# Patient Record
Sex: Male | Born: 1955 | Race: White | Hispanic: No | Marital: Married | Smoking: Former smoker
Health system: Southern US, Community
[De-identification: ages and names within clinical notes are randomized; demographics above are authoritative.]

## PROBLEM LIST (undated history)

## (undated) DIAGNOSIS — R7303 Prediabetes: Secondary | ICD-10-CM

## (undated) DIAGNOSIS — M1990 Unspecified osteoarthritis, unspecified site: Secondary | ICD-10-CM

## (undated) DIAGNOSIS — I4891 Unspecified atrial fibrillation: Secondary | ICD-10-CM

## (undated) DIAGNOSIS — K219 Gastro-esophageal reflux disease without esophagitis: Secondary | ICD-10-CM

## (undated) DIAGNOSIS — I10 Essential (primary) hypertension: Secondary | ICD-10-CM

## (undated) DIAGNOSIS — K2270 Barrett's esophagus without dysplasia: Secondary | ICD-10-CM

## (undated) DIAGNOSIS — I499 Cardiac arrhythmia, unspecified: Secondary | ICD-10-CM

## (undated) HISTORY — DX: Unspecified osteoarthritis, unspecified site: M19.90

## (undated) HISTORY — PX: KNEE SURGERY: SHX244

## (undated) HISTORY — PX: CARDIOVERSION: SHX1299

## (undated) HISTORY — DX: Gastro-esophageal reflux disease without esophagitis: K21.9

---

## 1999-05-05 ENCOUNTER — Emergency Department (HOSPITAL_COMMUNITY): Admission: EM | Admit: 1999-05-05 | Discharge: 1999-05-05 | Payer: Self-pay | Admitting: Dentistry

## 2005-12-22 ENCOUNTER — Ambulatory Visit: Payer: Self-pay | Admitting: Gastroenterology

## 2006-01-27 ENCOUNTER — Ambulatory Visit: Payer: Self-pay | Admitting: Gastroenterology

## 2006-03-09 ENCOUNTER — Ambulatory Visit: Payer: Self-pay | Admitting: Gastroenterology

## 2011-10-20 HISTORY — PX: CARDIOVERSION: SHX1299

## 2012-01-30 ENCOUNTER — Emergency Department (HOSPITAL_COMMUNITY): Payer: Self-pay

## 2012-01-30 ENCOUNTER — Emergency Department (HOSPITAL_COMMUNITY)
Admission: EM | Admit: 2012-01-30 | Discharge: 2012-01-30 | Disposition: A | Discharge status: Home / Self Care | Payer: Self-pay | Attending: Emergency Medicine | Admitting: Emergency Medicine

## 2012-01-30 ENCOUNTER — Encounter (HOSPITAL_COMMUNITY): Payer: Self-pay

## 2012-01-30 DIAGNOSIS — F17200 Nicotine dependence, unspecified, uncomplicated: Secondary | ICD-10-CM | POA: Insufficient documentation

## 2012-01-30 DIAGNOSIS — S81009A Unspecified open wound, unspecified knee, initial encounter: Secondary | ICD-10-CM | POA: Insufficient documentation

## 2012-01-30 DIAGNOSIS — Y92009 Unspecified place in unspecified non-institutional (private) residence as the place of occurrence of the external cause: Secondary | ICD-10-CM | POA: Insufficient documentation

## 2012-01-30 DIAGNOSIS — S81019A Laceration without foreign body, unspecified knee, initial encounter: Secondary | ICD-10-CM

## 2012-01-30 DIAGNOSIS — M25579 Pain in unspecified ankle and joints of unspecified foot: Secondary | ICD-10-CM | POA: Insufficient documentation

## 2012-01-30 DIAGNOSIS — S93409A Sprain of unspecified ligament of unspecified ankle, initial encounter: Secondary | ICD-10-CM | POA: Insufficient documentation

## 2012-01-30 DIAGNOSIS — S81809A Unspecified open wound, unspecified lower leg, initial encounter: Secondary | ICD-10-CM | POA: Insufficient documentation

## 2012-01-30 DIAGNOSIS — S92919A Unspecified fracture of unspecified toe(s), initial encounter for closed fracture: Secondary | ICD-10-CM | POA: Insufficient documentation

## 2012-01-30 DIAGNOSIS — W19XXXA Unspecified fall, initial encounter: Secondary | ICD-10-CM

## 2012-01-30 DIAGNOSIS — M25569 Pain in unspecified knee: Secondary | ICD-10-CM | POA: Insufficient documentation

## 2012-01-30 DIAGNOSIS — W010XXA Fall on same level from slipping, tripping and stumbling without subsequent striking against object, initial encounter: Secondary | ICD-10-CM | POA: Insufficient documentation

## 2012-01-30 MED ORDER — OXYCODONE-ACETAMINOPHEN 5-325 MG PO TABS: 1.0000 | ORAL_TABLET | Freq: Four times a day (QID) | ORAL | Status: AC | PRN

## 2012-01-30 MED ORDER — IBUPROFEN 200 MG PO TABS
600.0000 mg | ORAL_TABLET | Freq: Once | ORAL | Status: AC
  Administered 2012-01-30: 600 mg via ORAL
  Filled 2012-01-30: qty 3

## 2012-01-30 MED ORDER — LIDOCAINE-EPINEPHRINE 2 %-1:100000 IJ SOLN: 10.0000 mL | Freq: Once | INTRAMUSCULAR | Status: DC

## 2012-01-30 MED ORDER — ONDANSETRON 8 MG PO TBDP
8.0000 mg | ORAL_TABLET | Freq: Once | ORAL | Status: AC
  Administered 2012-01-30: 8 mg via ORAL
  Filled 2012-01-30: qty 1

## 2012-01-30 MED ORDER — OXYCODONE-ACETAMINOPHEN 5-325 MG PO TABS
1.0000 | ORAL_TABLET | Freq: Once | ORAL | Status: AC
  Administered 2012-01-30: 1 via ORAL
  Filled 2012-01-30: qty 1

## 2012-01-30 MED ORDER — LIDOCAINE HCL 2 % IJ SOLN
INTRAMUSCULAR | Status: AC
  Administered 2012-01-30: 20 mg
  Filled 2012-01-30: qty 1

## 2012-01-30 NOTE — ED Notes (Signed)
Pt in with c/o right great toe/knee pain and left ankle pain states fell this am pt present with a laceration/abrasion to the left knee dressing in place bruising to the left great toe pain with movement and right ankle swelling

## 2012-01-30 NOTE — ED Provider Notes (Signed)
History     CSN: [id number]  Arrival date & time 01/30/12  1324   First MD Initiated Contact with Patient 01/30/12 1503      Chief Complaint  Patient presents with  . Toe Injury    right  . Ankle Pain    left  . Fall  . Knee Pain    laceration to the right knee    (Consider location/radiation/quality/duration/timing/severity/associated sxs/prior treatment) Patient is a 56 y.o. male presenting with ankle pain, fall, and knee pain. The history is provided by the patient.  Ankle Pain   Fall Pertinent negatives include no fever, no abdominal pain, no nausea, no vomiting and no headaches.  Knee Pain Pertinent negatives include no chest pain, no abdominal pain, no headaches and no shortness of breath.  pt staes was on front steps of home, stumbled, fell forward. C/o left ankle pain laterally and right great toe pain. Constant, dull pain. Non radiating. Worse w palpation and movement. Skin intact in both areas. 1.5 cm lac to right knee anteriorly. Pain localized to lac. Is able to walk. States cut occurred on stone block, denies glass/gravel/other fb. Tetanus within last 2 years. Denies head injury or headache. No neck or back pain. Denies other injury. No nv.   History reviewed. No pertinent past medical history.  History reviewed. No pertinent past surgical history.  No family history on file.  History  Substance Use Topics  . Smoking status: Current Some Day Smoker  . Smokeless tobacco: Not on file  . Alcohol Use: Yes      Review of Systems  Constitutional: Negative for fever.  HENT: Negative for neck pain.   Respiratory: Negative for shortness of breath.   Cardiovascular: Negative for chest pain.  Gastrointestinal: Negative for nausea, vomiting and abdominal pain.  Genitourinary: Negative for flank pain.  Musculoskeletal: Negative for back pain.  Skin: Positive for wound.  Neurological: Negative for headaches.  Hematological: Does not bruise/bleed easily.    Psychiatric/Behavioral: Negative for confusion.    Allergies  Vicodin  Home Medications   Current Outpatient Rx  Name Route Sig Dispense Refill  . ACETAMINOPHEN 500 MG PO TABS Oral Take 1,000 mg by mouth every 6 (six) hours as needed. pain    . LANSOPRAZOLE 30 MG PO CPDR Oral Take 30 mg by mouth daily.      BP 146/91  Pulse 117  Temp(Src) 98.1 F (36.7 C) (Oral)  Resp 18  SpO2 99%  Physical Exam  Nursing note and vitals reviewed. Constitutional: He is oriented to person, place, and time. He appears well-developed and well-nourished. No distress.  HENT:  Head: Atraumatic.  Eyes: Pupils are equal, round, and reactive to light.  Neck: Neck supple. No tracheal deviation present.  Cardiovascular: Normal rate.   Pulmonary/Chest: Effort normal. No accessory muscle usage. No respiratory distress.  Abdominal: Soft. He exhibits no distension. There is no tenderness.  Musculoskeletal: Normal range of motion.       Good rom bil knees and ankles. bil distal pulses palp. 1.5 cm lac to right knee anteriorly. Knee stable, no gross ligament laxity. No effusion. sts and tenderness base right great toe, skin intact. Normal cap refill distally. sts and tenderness left lateral malleolus. Ankle stable. Skin intact. No other focal bony tenderness noted on extremity exam.   Neurological: He is alert and oriented to person, place, and time.       Motor intact bil   Skin: Skin is warm and dry.  Psychiatric: He has  a normal mood and affect.    ED Course  Procedures (including critical care time)  No results found for this or any previous visit. Dg Ankle Complete Left  01/30/2012  *RADIOLOGY REPORT*  Clinical Data: Fall, injury   LEFT ANKLE COMPLETE - 3+ VIEW  Comparison: None.  Findings: Ankle mortise intact.  Talar dome is normal.  No malleolar fracture.  Calcaneus is normal.  IMPRESSION: No evidence of left ankle fracture.  Original Report Authenticated By: Genevive Bi, M.D.   Dg Knee  Complete 4 Views Right  01/30/2012  *RADIOLOGY REPORT*  Clinical Data: Confusion and laceration.  Rule out fracture or foreign body.  RIGHT KNEE - COMPLETE 4+ VIEW  Comparison: None.  Findings: The right knee is located.  There is no significant effusion.  No acute bone or soft tissue abnormality is present.  IMPRESSION: Negative right knee.  Original Report Authenticated By: Jamesetta Orleans. MATTERN, M.D.   Dg Toe Great Right  01/30/2012  *RADIOLOGY REPORT*  Clinical Data: Toe injury  RIGHT GREAT TOE  Comparison: None.  Findings: There is a fracture along the medial aspect of the base of the proximal phalanx of the first digit.  Fracture likely enters articular surface.  IMPRESSION: Fracture at the base of the proximal phalanx of the first digit.  Original Report Authenticated By: Genevive Bi, M.D.        MDM  aso to left ankle. Crutches. Dynamic splint to right great toe. Skin intact in both areas.  1.5 cm lac to right knee. No fb seen/felt. Sutured. Motrin po. Tetanus up to date.    LACERATION REPAIR Performed by: Suzi Roots Authorized by: Suzi Roots Consent: Verbal consent obtained. Risks and benefits: risks, benefits and alternatives were discussed Consent given by: patient Patient identity confirmed: provided demographic data Prepped and Draped in normal sterile fashion Wound explored  Laceration Location: right knee  Laceration Length: 1.5cm  No Foreign Bodies seen or palpated  Anesthesia: local infiltration  Local anesthetic: lidocaine 2% wo epinephrine  Anesthetic total: 3 ml  Irrigation method: syringe Amount of cleaning: standard  Skin closure: 3-0 prolene  Number of sutures: 2  Technique: interrupted.   Patient tolerance: Patient tolerated the procedure well with no immediate complications. No fb seen or felt.      Rxn to vicodin is nausea. Percocet 1 po (has ride, does not have to drive), zofran po.        Suzi Roots,  MD 01/30/12 [phone]

## 2012-01-30 NOTE — ED Notes (Signed)
Patient transported to X-ray 

## 2012-01-30 NOTE — ED Notes (Signed)
Veatrice Bourbon Tech called for ASO Ankle, crutches and Buddy Tape.

## 2012-01-30 NOTE — Discharge Instructions (Signed)
Ice/cold pack to sore areas. May use cast shoe and buddy tape first toe to next for comfort/support.  Follow up with orthopedist in coming week. Elevate/ice injured feet/ankle. For ankle, wear brace/support as need for comfort, use crutches as need.Take motrin or aleve as need for pain. You may also take percocet as need for pain. No driving for the next 6 hours or when taking percocet. Also, do not take tylenol or acetaminophen containing medication when taking percocet.  Keep wound very clean. Have sutures removed, your doctor or urgent care, in 12-14 days.  Return to ER if worse, new symptoms, severe pain, infection of wound, other concern.       Ankle Sprain An ankle sprain is an injury to the strong, fibrous tissues (ligaments) that hold the bones of your ankle joint together.  CAUSES Ankle sprain usually is caused by a fall or by twisting your ankle. People who participate in sports are more prone to these types of injuries.  SYMPTOMS  Symptoms of ankle sprain include:  Pain in your ankle. The pain may be present at rest or only when you are trying to stand or walk.   Swelling.   Bruising. Bruising may develop immediately or within 1 to 2 days after your injury.   Difficulty standing or walking.  DIAGNOSIS  Your caregiver will ask you details about your injury and perform a physical exam of your ankle to determine if you have an ankle sprain. During the physical exam, your caregiver will press and squeeze specific areas of your foot and ankle. Your caregiver will try to move your ankle in certain ways. An X-ray exam may be done to be sure a bone was not broken or a ligament did not separate from one of the bones in your ankle (avulsion).  TREATMENT  Certain types of braces can help stabilize your ankle. Your caregiver can make a recommendation for this. Your caregiver may recommend the use of medication for pain. If your sprain is severe, your caregiver may refer you to a surgeon who  helps to restore function to parts of your skeletal system (orthopedist) or a physical therapist. HOME CARE INSTRUCTIONS  Apply ice to your injury for 1 to 2 days or as directed by your caregiver. Applying ice helps to reduce inflammation and pain.  Put ice in a plastic bag.   Place a towel between your skin and the bag.   Leave the ice on for 15 to 20 minutes at a time, every 2 hours while you are awake.   Take over-the-counter or prescription medicines for pain, discomfort, or fever only as directed by your caregiver.   Keep your injured leg elevated, when possible, to lessen swelling.   If your caregiver recommends crutches, use them as instructed. Gradually, put weight on the affected ankle. Continue to use crutches or a cane until you can walk without feeling pain in your ankle.   If you have a plaster splint, wear the splint as directed by your caregiver. Do not rest it on anything harder than a pillow the first 24 hours. Do not put weight on it. Do not get it wet. You may take it off to take a shower or bath.   You may have been given an elastic bandage to wear around your ankle to provide support. If the elastic bandage is too tight (you have numbness or tingling in your foot or your foot becomes cold and blue), adjust the bandage to make it comfortable.  If you have an air splint, you may blow more air into it or let air out to make it more comfortable. You may take your splint off at night and before taking a shower or bath.   Wiggle your toes in the splint several times per day if you are able.  SEEK MEDICAL CARE IF:   You have an increase in bruising, swelling, or pain.   Your toes feel cold.   Pain relief is not achieved with medication.  SEEK IMMEDIATE MEDICAL CARE IF: Your toes are numb or blue or you have severe pain. MAKE SURE YOU:   Understand these instructions.   Will watch your condition.   Will get help right away if you are not doing well or get worse.    Document Released: 10/05/2005 Document Revised: 09/24/2011 Document Reviewed: 05/09/2008 [facility] Patient Information 2012 [place], [place].      Toe Fracture Your caregiver has diagnosed you as having a fractured toe. A toe fracture is a break in the bone of a toe. "Buddy taping" is a way of splinting your broken toe, by taping the broken toe to the toe next to it. This "buddy taping" will keep the injured toe from moving beyond normal range of motion. Buddy taping also helps the toe heal in a more normal alignment. It may take 6 to 8 weeks for the toe injury to heal. HOME CARE INSTRUCTIONS   Leave your toes taped together for as long as directed by your caregiver or until you see a doctor for a follow-up examination. You can change the tape after bathing. Always use a small piece of gauze or cotton between the toes when taping them together. This will help the skin stay dry and prevent infection.   Apply ice to the injury for 15 to 20 minutes each hour while awake for the first 2 days. Put the ice in a plastic bag and place a towel between the bag of ice and your skin.   After the first 2 days, apply heat to the injured area. Use heat for the next 2 to 3 days. Place a heating pad on the foot or soak the foot in warm water as directed by your caregiver.   Keep your foot elevated as much as possible to lessen swelling.   Wear sturdy, supportive shoes. The shoes should not pinch the toes or fit tightly against the toes.   Your caregiver may prescribe a rigid shoe if your foot is very swollen.   Your may be given crutches if the pain is too great and it hurts too much to walk.   Only take over-the-counter or prescription medicines for pain, discomfort, or fever as directed by your caregiver.   If your caregiver has given you a follow-up appointment, it is very important to keep that appointment. Not keeping the appointment could result in a chronic or permanent injury, pain, and disability.  If there is any problem keeping the appointment, you must call back to this facility for assistance.  SEEK MEDICAL CARE IF:   You have increased pain or swelling, not relieved with medications.   The pain does not get better after 1 week.   Your injured toe is cold when the others are warm.  SEEK IMMEDIATE MEDICAL CARE IF:   The toe becomes cold, numb, or white.   The toe becomes hot (inflamed) and red.  Document Released: 10/02/2000 Document Revised: 09/24/2011 Document Reviewed: 05/21/2008 [facility] Patient Information 2012 [place], [place].  Laceration Care, Adult A laceration is a cut or lesion that goes through all layers of the skin and into the tissue just beneath the skin. TREATMENT  Some lacerations may not require closure. Some lacerations may not be able to be closed due to an increased risk of infection. It is important to see your caregiver as soon as possible after an injury to minimize the risk of infection and maximize the opportunity for successful closure. If closure is appropriate, pain medicines may be given, if needed. The wound will be cleaned to help prevent infection. Your caregiver will use stitches (sutures), staples, wound glue (adhesive), or skin adhesive strips to repair the laceration. These tools bring the skin edges together to allow for faster healing and a better cosmetic outcome. However, all wounds will heal with a scar. Once the wound has healed, scarring can be minimized by covering the wound with sunscreen during the day for 1 full year. HOME CARE INSTRUCTIONS  For sutures or staples:  Keep the wound clean and dry.   If you were given a bandage (dressing), you should change it at least once a day. Also, change the dressing if it becomes wet or dirty, or as directed by your caregiver.   Wash the wound with soap and water 2 times a day. Rinse the wound off with water to remove all soap. Pat the wound dry with a clean towel.   After cleaning,  apply a thin layer of the antibiotic ointment as recommended by your caregiver. This will help prevent infection and keep the dressing from sticking.   You may shower as usual after the first 24 hours. Do not soak the wound in water until the sutures are removed.   Only take over-the-counter or prescription medicines for pain, discomfort, or fever as directed by your caregiver.   Get your sutures or staples removed as directed by your caregiver.  For skin adhesive strips:  Keep the wound clean and dry.   Do not get the skin adhesive strips wet. You may bathe carefully, using caution to keep the wound dry.   If the wound gets wet, pat it dry with a clean towel.   Skin adhesive strips will fall off on their own. You may trim the strips as the wound heals. Do not remove skin adhesive strips that are still stuck to the wound. They will fall off in time.  For wound adhesive:  You may briefly wet your wound in the shower or bath. Do not soak or scrub the wound. Do not swim. Avoid periods of heavy perspiration until the skin adhesive has fallen off on its own. After showering or bathing, gently pat the wound dry with a clean towel.   Do not apply liquid medicine, cream medicine, or ointment medicine to your wound while the skin adhesive is in place. This may loosen the film before your wound is healed.   If a dressing is placed over the wound, be careful not to apply tape directly over the skin adhesive. This may cause the adhesive to be pulled off before the wound is healed.   Avoid prolonged exposure to sunlight or tanning lamps while the skin adhesive is in place. Exposure to ultraviolet light in the first year will darken the scar.   The skin adhesive will usually remain in place for 5 to 10 days, then naturally fall off the skin. Do not pick at the adhesive film.  You may need a tetanus shot if:  You  cannot remember when you had your last tetanus shot.   You have never had a tetanus  shot.  If you get a tetanus shot, your arm may swell, get red, and feel warm to the touch. This is common and not a problem. If you need a tetanus shot and you choose not to have one, there is a rare chance of getting tetanus. Sickness from tetanus can be serious. SEEK MEDICAL CARE IF:   You have redness, swelling, or increasing pain in the wound.   You see a red line that goes away from the wound.   You have yellowish-white fluid (pus) coming from the wound.   You have a fever.   You notice a bad smell coming from the wound or dressing.   Your wound breaks open before or after sutures have been removed.   You notice something coming out of the wound such as wood or glass.   Your wound is on your hand or foot and you cannot move a finger or toe.  SEEK IMMEDIATE MEDICAL CARE IF:   Your pain is not controlled with prescribed medicine.   You have severe swelling around the wound causing pain and numbness or a change in color in your arm, hand, leg, or foot.   Your wound splits open and starts bleeding.   You have worsening numbness, weakness, or loss of function of any joint around or beyond the wound.   You develop painful lumps near the wound or on the skin anywhere on your body.  MAKE SURE YOU:   Understand these instructions.   Will watch your condition.   Will get help right away if you are not doing well or get worse.  Document Released: 10/05/2005 Document Revised: 09/24/2011 Document Reviewed: 03/31/2011 [facility] Patient Information 2012 [place], [place].     Cryotherapy Cryotherapy means treatment with cold. Ice or gel packs can be used to reduce both pain and swelling. Ice is the most helpful within the first 24 to 48 hours after an injury or flareup from overusing a muscle or joint. Sprains, strains, spasms, burning pain, shooting pain, and aches can all be eased with ice. Ice can also be used when recovering from surgery. Ice is effective, has very few side  effects, and is safe for most people to use. PRECAUTIONS  Ice is not a safe treatment option for people with:  Raynaud's phenomenon. This is a condition affecting small blood vessels in the extremities. Exposure to cold may cause your problems to return.   Cold hypersensitivity. There are many forms of cold hypersensitivity, including:   Cold urticaria. Red, itchy hives appear on the skin when the tissues begin to warm after being iced.   Cold erythema. This is a red, itchy rash caused by exposure to cold.   Cold hemoglobinuria. Red blood cells break down when the tissues begin to warm after being iced. The hemoglobin that carry oxygen are passed into the urine because they cannot combine with blood proteins fast enough.   Numbness or altered sensitivity in the area being iced.  If you have any of the following conditions, do not use ice until you have discussed cryotherapy with your caregiver:  Heart conditions, such as arrhythmia, angina, or chronic heart disease.   High blood pressure.   Healing wounds or open skin in the area being iced.   Current infections.   Rheumatoid arthritis.   Poor circulation.   Diabetes.  Ice slows the blood flow in the region  it is applied. This is beneficial when trying to stop inflamed tissues from spreading irritating chemicals to surrounding tissues. However, if you expose your skin to cold temperatures for too long or without the proper protection, you can damage your skin or nerves. Watch for signs of skin damage due to cold. HOME CARE INSTRUCTIONS Follow these tips to use ice and cold packs safely.  Place a dry or damp towel between the ice and skin. A damp towel will cool the skin more quickly, so you may need to shorten the time that the ice is used.   For a more rapid response, add gentle compression to the ice.   Ice for no more than 10 to 20 minutes at a time. The bonier the area you are icing, the less time it will take to get the  benefits of ice.   Check your skin after 5 minutes to make sure there are no signs of a poor response to cold or skin damage.   Rest 20 minutes or more in between uses.   Once your skin is numb, you can end your treatment. You can test numbness by very lightly touching your skin. The touch should be so light that you do not see the skin dimple from the pressure of your fingertip. When using ice, most people will feel these normal sensations in this order: cold, burning, aching, and numbness.   Do not use ice on someone who cannot communicate their responses to pain, such as small children or people with dementia.  HOW TO MAKE AN ICE PACK Ice packs are the most common way to use ice therapy. Other methods include ice massage, ice baths, and cryo-sprays. Muscle creams that cause a cold, tingly feeling do not offer the same benefits that ice offers and should not be used as a substitute unless recommended by your caregiver. To make an ice pack, do one of the following:  Place crushed ice or a bag of frozen vegetables in a sealable plastic bag. Squeeze out the excess air. Place this bag inside another plastic bag. Slide the bag into a pillowcase or place a damp towel between your skin and the bag.   Mix 3 parts water with 1 part rubbing alcohol. Freeze the mixture in a sealable plastic bag. When you remove the mixture from the freezer, it will be slushy. Squeeze out the excess air. Place this bag inside another plastic bag. Slide the bag into a pillowcase or place a damp towel between your skin and the bag.  SEEK MEDICAL CARE IF:  You develop white spots on your skin. This may give the skin a blotchy (mottled) appearance.   Your skin turns blue or pale.   Your skin becomes waxy or hard.   Your swelling gets worse.  MAKE SURE YOU:   Understand these instructions.   Will watch your condition.   Will get help right away if you are not doing well or get worse.  Document Released: 06/01/2011  Document Revised: 09/24/2011 Document Reviewed: 06/01/2011 [facility] Patient Information 2012 [place], [place].      Crutch Use You have been prescribed crutches to take weight off one of your lower legs or feet (extremities). When using crutches, make sure you are not putting pressure on the armpit (axilla). This could cause damage to the nerves that extend from your axilla to the hand and arm. When fitted properly the crutches should be 2 to 3 finger widths below the axilla. Your weight should be  supported by your hand, and not by resting upon the crutch with the axilla. When walking, first step with the crutches, then swing the healthy leg through and slightly ahead. When going up stairs, first step up with the healthy leg and then follow with the crutches and injured leg up to the same step, and so forth. If there is a handrail, hold both crutches in one hand, place your other hand on the handrail, and while placing your weight on your arms, lift your good leg to the step, then bring the crutches and the injured leg up to that step. Repeat for each step. When going down stairs, first step with the injured leg and crutches, following down with the healthy leg to the same step. Be very careful, as going down stairs with crutches is very challenging. If you feel wobbly or nervous, sit down and inch yourself down the stairs on your butt. To get up from a chair, hold injured leg forward, grab armrest with one hand and the top of the crutches with the other hand. Using these supports, pull yourself up to a standing position. Reverse this procedure for sitting. See your caregiver for follow up as suggested. If you are discharged in an ace wrap and develop numbness, tingling, swelling, or increased pain, loosen the ace wrap and re-wrap looser. If these problems persist, see your caregiver as needed. If you have been instructed to use partial weight bearing, bear (apply) the amount of weight as suggested by  your caregiver. Do not bear weight in an amount that causes pain on the area of injury. Document Released: 10/02/2000 Document Revised: 09/24/2011 Document Reviewed: 12/10/2008 [facility] Patient Information 2012 [place], [place].    Hypertension As your heart beats, it forces blood through your arteries. This force is your blood pressure. If the pressure is too high, it is called hypertension (HTN) or high blood pressure. HTN is dangerous because you may have it and not know it. High blood pressure may mean that your heart has to work harder to pump blood. Your arteries may be narrow or stiff. The extra work puts you at risk for heart disease, stroke, and other problems.  Blood pressure consists of two numbers, a higher number over a lower, 110/72, for example. It is stated as "110 over 72." The ideal is below 120 for the top number (systolic) and under 80 for the bottom (diastolic). Write down your blood pressure today. You should pay close attention to your blood pressure if you have certain conditions such as:  Heart failure.   Prior heart attack.   Diabetes   Chronic kidney disease.   Prior stroke.   Multiple risk factors for heart disease.  To see if you have HTN, your blood pressure should be measured while you are seated with your arm held at the level of the heart. It should be measured at least twice. A one-time elevated blood pressure reading (especially in the Emergency Department) does not mean that you need treatment. There may be conditions in which the blood pressure is different between your right and left arms. It is important to see your caregiver soon for a recheck. Most people have essential hypertension which means that there is not a specific cause. This type of high blood pressure may be lowered by changing lifestyle factors such as:  Stress.   Smoking.   Lack of exercise.   Excessive weight.   Drug/tobacco/alcohol use.   Eating less salt.  Most people do not  have symptoms from high blood pressure until it has caused damage to the body. Effective treatment can often prevent, delay or reduce that damage. TREATMENT  When a cause has been identified, treatment for high blood pressure is directed at the cause. There are a large number of medications to treat HTN. These fall into several categories, and your caregiver will help you select the medicines that are best for you. Medications may have side effects. You should review side effects with your caregiver. If your blood pressure stays high after you have made lifestyle changes or started on medicines,   Your medication(s) may need to be changed.   Other problems may need to be addressed.   Be certain you understand your prescriptions, and know how and when to take your medicine.   Be sure to follow up with your caregiver within the time frame advised (usually within two weeks) to have your blood pressure rechecked and to review your medications.   If you are taking more than one medicine to lower your blood pressure, make sure you know how and at what times they should be taken. Taking two medicines at the same time can result in blood pressure that is too low.  SEEK IMMEDIATE MEDICAL CARE IF:  You develop a severe headache, blurred or changing vision, or confusion.   You have unusual weakness or numbness, or a faint feeling.   You have severe chest or abdominal pain, vomiting, or breathing problems.  MAKE SURE YOU:   Understand these instructions.   Will watch your condition.   Will get help right away if you are not doing well or get worse.  Document Released: 10/05/2005 Document Revised: 09/24/2011 Document Reviewed: 05/25/2008 [facility] Patient Information 2012 [place], [place].

## 2012-01-30 NOTE — ED Notes (Signed)
MD at bedside. 

## 2012-02-16 ENCOUNTER — Encounter (HOSPITAL_COMMUNITY): Payer: Self-pay | Admitting: Emergency Medicine

## 2012-02-16 ENCOUNTER — Encounter (HOSPITAL_COMMUNITY): Payer: Self-pay | Admitting: Anesthesiology

## 2012-02-16 ENCOUNTER — Emergency Department (HOSPITAL_COMMUNITY): Payer: Self-pay

## 2012-02-16 ENCOUNTER — Inpatient Hospital Stay (HOSPITAL_COMMUNITY): Payer: Self-pay | Admitting: Anesthesiology

## 2012-02-16 ENCOUNTER — Inpatient Hospital Stay (HOSPITAL_COMMUNITY)
Admission: EM | Admit: 2012-02-16 | Discharge: 2012-02-16 | DRG: 310 | Disposition: A | Discharge status: Home / Self Care | Payer: Self-pay | Attending: Cardiology | Admitting: Cardiology

## 2012-02-16 DIAGNOSIS — I4891 Unspecified atrial fibrillation: Principal | ICD-10-CM | POA: Diagnosis present

## 2012-02-16 DIAGNOSIS — F17200 Nicotine dependence, unspecified, uncomplicated: Secondary | ICD-10-CM | POA: Diagnosis present

## 2012-02-16 DIAGNOSIS — K2270 Barrett's esophagus without dysplasia: Secondary | ICD-10-CM | POA: Diagnosis present

## 2012-02-16 HISTORY — DX: Barrett's esophagus without dysplasia: K22.70

## 2012-02-16 LAB — HEPARIN LEVEL (UNFRACTIONATED): Heparin Unfractionated: 0.15 IU/mL — ABNORMAL LOW (ref 0.30–0.70)

## 2012-02-16 LAB — POCT I-STAT, CHEM 8
BUN: 10 mg/dL (ref 6–23)
Calcium, Ion: 1.21 mmol/L (ref 1.12–1.32)
Creatinine, Ser: 0.9 mg/dL (ref 0.50–1.35)
Glucose, Bld: 130 mg/dL — ABNORMAL HIGH (ref 70–99)
Hemoglobin: 15.3 g/dL (ref 13.0–17.0)
TCO2: 20 mmol/L (ref 0–100)

## 2012-02-16 LAB — COMPREHENSIVE METABOLIC PANEL
ALT: 16 U/L (ref 0–53)
AST: 17 U/L (ref 0–37)
Albumin: 3.7 g/dL (ref 3.5–5.2)
Alkaline Phosphatase: 53 U/L (ref 39–117)
BUN: 11 mg/dL (ref 6–23)
Chloride: 105 mEq/L (ref 96–112)
Potassium: 3.5 mEq/L (ref 3.5–5.1)
Sodium: 140 mEq/L (ref 135–145)
Total Bilirubin: 0.3 mg/dL (ref 0.3–1.2)

## 2012-02-16 LAB — LIPID PANEL
HDL: 47 mg/dL (ref >39)
Total CHOL/HDL Ratio: 2.5 RATIO
VLDL: 21 mg/dL (ref 0–40)

## 2012-02-16 LAB — CBC
HCT: 43.6 % (ref 39.0–52.0)
Platelets: 213 10*3/uL (ref 150–400)
RDW: 13.3 % (ref 11.5–15.5)
WBC: 12.5 10*3/uL — ABNORMAL HIGH (ref 4.0–10.5)

## 2012-02-16 LAB — CARDIAC PANEL(CRET KIN+CKTOT+MB+TROPI)
Relative Index: INVALID (ref 0.0–2.5)
Total CK: 89 U/L (ref 7–232)
Troponin I: <0.3 ng/mL (ref <0.30)

## 2012-02-16 LAB — TSH: TSH: 1.822 u[IU]/mL (ref 0.350–4.500)

## 2012-02-16 LAB — MRSA PCR SCREENING: MRSA by PCR: NEGATIVE

## 2012-02-16 MED ORDER — HEPARIN SODIUM (PORCINE) 5000 UNIT/ML IJ SOLN: 5000.0000 [IU] | Freq: Three times a day (TID) | INTRAMUSCULAR | Status: DC

## 2012-02-16 MED ORDER — DILTIAZEM HCL 100 MG IV SOLR
5.0000 mg/h | INTRAVENOUS | Status: DC
  Administered 2012-02-16: 5 mg/h via INTRAVENOUS
  Filled 2012-02-16 (×2): qty 100

## 2012-02-16 MED ORDER — PNEUMOCOCCAL VAC POLYVALENT 25 MCG/0.5ML IJ INJ: 0.5000 mL | INJECTION | INTRAMUSCULAR | Status: DC

## 2012-02-16 MED ORDER — ASPIRIN 81 MG PO CHEW
324.0000 mg | CHEWABLE_TABLET | Freq: Once | ORAL | Status: AC
  Administered 2012-02-16: 324 mg via ORAL

## 2012-02-16 MED ORDER — SODIUM CHLORIDE 0.9 % IV SOLN: 250.0000 mL | INTRAVENOUS | Status: DC

## 2012-02-16 MED ORDER — SODIUM CHLORIDE 0.9 % IJ SOLN: 3.0000 mL | INTRAMUSCULAR | Status: DC | PRN

## 2012-02-16 MED ORDER — SODIUM CHLORIDE 0.9 % IJ SOLN: 3.0000 mL | Freq: Two times a day (BID) | INTRAMUSCULAR | Status: DC

## 2012-02-16 MED ORDER — HYDROCORTISONE 1 % EX CREA
1.0000 "application " | TOPICAL_CREAM | Freq: Three times a day (TID) | CUTANEOUS | Status: DC | PRN
  Filled 2012-02-16: qty 28

## 2012-02-16 MED ORDER — SODIUM CHLORIDE 0.9 % IV SOLN: 250.0000 mL | INTRAVENOUS | Status: DC | PRN

## 2012-02-16 MED ORDER — LIDOCAINE HCL (CARDIAC) 20 MG/ML IV SOLN
INTRAVENOUS | Status: DC | PRN
  Administered 2012-02-16: 30 mg via INTRAVENOUS

## 2012-02-16 MED ORDER — DILTIAZEM HCL ER COATED BEADS 180 MG PO CP24: 180.0000 mg | ORAL_CAPSULE | Freq: Every day | ORAL | Status: DC

## 2012-02-16 MED ORDER — ASPIRIN 325 MG PO TABS
325.0000 mg | ORAL_TABLET | Freq: Every day | ORAL | Status: DC
  Administered 2012-02-16: 325 mg via ORAL
  Filled 2012-02-16: qty 1

## 2012-02-16 MED ORDER — HEPARIN (PORCINE) IN NACL 100-0.45 UNIT/ML-% IJ SOLN
1000.0000 [IU]/h | INTRAMUSCULAR | Status: DC
  Administered 2012-02-16: 1000 [IU]/h via INTRAVENOUS
  Filled 2012-02-16: qty 250

## 2012-02-16 MED ORDER — DILTIAZEM HCL ER COATED BEADS 180 MG PO CP24
180.0000 mg | ORAL_CAPSULE | Freq: Every day | ORAL | Status: DC
  Administered 2012-02-16: 180 mg via ORAL
  Filled 2012-02-16: qty 1

## 2012-02-16 MED ORDER — ASPIRIN 325 MG PO TABS: 325.0000 mg | ORAL_TABLET | Freq: Every day | ORAL | Status: DC

## 2012-02-16 MED ORDER — PROPOFOL 10 MG/ML IV BOLUS
INTRAVENOUS | Status: DC | PRN
  Administered 2012-02-16: 80 mg via INTRAVENOUS

## 2012-02-16 MED ORDER — ASPIRIN 81 MG PO CHEW: 324.0000 mg | CHEWABLE_TABLET | Freq: Once | ORAL | Status: DC

## 2012-02-16 NOTE — Discharge Instructions (Signed)
Return to work on 02/21/2012

## 2012-02-16 NOTE — Anesthesia Postprocedure Evaluation (Signed)
  Anesthesia Post-op Note  Patient: Scott Townsend  Procedure(s) Performed: * No procedures listed *  Patient Location: ICU  Anesthesia Type: General  Level of Consciousness: awake, alert  and oriented  Airway and Oxygen Therapy: Patient Spontanous Breathing and Patient connected to nasal cannula oxygen  Post-op Pain: none  Post-op Assessment: Post-op Vital signs reviewed, Patient's Cardiovascular Status Stable, Respiratory Function Stable, Patent Airway and No signs of Nausea or vomiting  Post-op Vital Signs: Reviewed and stable  Complications: No apparent anesthesia complications

## 2012-02-16 NOTE — Discharge Summary (Signed)
Physician Discharge Summary  Patient ID: Scott Townsend MRN: [id number] DOB/AGE: [birth year] 56 y.o.  Admit date: 02/16/2012 Discharge date: 02/16/2012  Primary Discharge Diagnosis Atrial fibrillation with rapid ventricular response.  Secondary diagnosis: Barret's esophagus   Significant Diagnostic Studies:  Consults:   Hospital Course: Patient was admitted via emergency department on 02/16/2012 with palpitations that started in the morning. He had atrial fibrillation with rapid ventricular response with a heart rate of 149 beats per minute. Patient has history of palpitations for the last 6-7 years and was previously evaluated by a routine treadmill stress test and also by wearing Holter monitor. He has been having palpitations lasting for 1-2 minutes occasionally. The present episode lasted for several hours when he presented to the emergency department. He was ruled out for myocardial infarction. His laboratory workup including CBC, CMP was normal and cardiac markers were negative. He was treated with IV Cardizem for rate control. He initially had chest pain and shortness of breath with palpitations however this completely resolved once his rate was controlled. She was switched over to by mouth Cardizem after cardioversion. Patient underwent successful direct current cardioversion the same day of admission. He converted to sinus rhythm with one shock after delivery of 120 J direct current. He maintained sinus rhythm and was felt to be safe to be discharged with outpatient followup and evaluation. He will need a routine treadmill exercise stress test and probably an echocardiogram in the outpatient basis. He has not seen Dr. Ellamae Sia in a while and I have encouraged him to reestablish with him and to follow up with him from noncardiac issue followup. His TSH was pending at the time of discharge.   Discharge Exam: Blood pressure 119/73, pulse 73, temperature 97.4 F (36.3 C),  temperature source Oral, resp. rate 18, height 5\' 10"  (1.778 m), weight 97.8 kg (215 lb 9.8 oz), SpO2 97.00%.     General appearance: alert, cooperative, appears stated age and no distress Eyes: conjunctivae/corneas clear. PERRL, EOM's intact. Fundi benign. Neck: no adenopathy, no carotid bruit, no JVD, supple, symmetrical, trachea midline and thyroid not enlarged, symmetric, no tenderness/mass/nodules Resp: clear to auscultation bilaterally Cardio: regular rate and rhythm, S1, S2 normal, no murmur, click, rub or gallop Extremities: extremities normal, atraumatic, no cyanosis or edema Labs:   Lab Results  Component Value Date   WBC 12.5* 02/16/2012   HGB 15.3 02/16/2012   HCT 45.0 02/16/2012   MCV 89.9 02/16/2012   PLT 213 02/16/2012    Lab 02/16/12 0105 02/16/12 0041  NA 142 --  K 3.4* --  CL 108 --  CO2 -- 19  BUN 10 --  CREATININE 0.90 --  CALCIUM -- 9.4  PROT -- 6.5  BILITOT -- 0.3  ALKPHOS -- 53  ALT -- 16  AST -- 17  GLUCOSE 130* --   Lab Results  Component Value Date   CKTOTAL 72 02/16/2012   CKMB 3.1 02/16/2012   TROPONINI <0.30 02/16/2012    Lipid Panel     Component Value Date/Time   CHOL 119 02/16/2012 0305   TRIG 107 02/16/2012 0305   HDL 47 02/16/2012 0305   CHOLHDL 2.5 02/16/2012 0305   VLDL 21 02/16/2012 0305   LDLCALC 51 02/16/2012 0305     Radiology:  02/16/2012  *RADIOLOGY REPORT*  Clinical Data: Chest pressure  PORTABLE CHEST - 1 VIEW  Comparison: None.  Findings: Heart size upper normal limits.  No focal consolidation. No pleural effusion.  No pneumothorax.  No acute  osseous abnormality.  IMPRESSION: Heart size upper normal limits.  No focal consolidation.  Original Report Authenticated By: Waneta Martins, M.D.    EKG: Admission EKG revealed atrial fibrillation with rapid ventricular response at a rate of 149 beats per minute. Post cardioversion EKG demonstrates normal sinus rhythm at a rate of 80 beats per minute.  FOLLOW UP PLANS AND  APPOINTMENTS  Medication List  As of 02/16/2012  1:11 PM   TAKE these medications         acetaminophen 500 MG tablet   Commonly known as: TYLENOL   Take 1,000 mg by mouth every 6 (six) hours as needed. pain      aspirin 325 MG tablet   Take 1 tablet (325 mg total) by mouth daily.      diltiazem 180 MG 24 hr capsule   Commonly known as: CARDIZEM CD   Take 1 capsule (180 mg total) by mouth daily.      lansoprazole 30 MG capsule   Commonly known as: PREVACID   Take 30 mg by mouth daily.           Follow-up Information    Call Pamella Pert, MD. (To be seen in 2 weeks to 3 weeks. Also to call if symptoms reappear.)    Contact information:   [address]  [place] [address] [phone]       Call DOOLITTLE, Harrel Lemon, MD. (To reestablish primary care needs)    Contact information:   [address] [place] [address] [phone]           Pamella Pert, MD 02/16/2012, 1:11 PM

## 2012-02-16 NOTE — ED Notes (Signed)
10 mg diliazem bolus given drip at 5mg  /hr

## 2012-02-16 NOTE — Anesthesia Preprocedure Evaluation (Signed)
Anesthesia Evaluation  Patient identified by MRN, date of birth, ID band Patient awake    Reviewed: Allergy & Precautions, H&P , NPO status , Patient's Chart, lab work & pertinent test results  Airway Mallampati: II      Dental  (+) Edentulous Upper and Edentulous Lower   Pulmonary          Cardiovascular + dysrhythmias Atrial Fibrillation Rhythm:Irregular     Neuro/Psych    GI/Hepatic hiatal hernia,   Endo/Other    Renal/GU      Musculoskeletal   Abdominal   Peds  Hematology   Anesthesia Other Findings   Reproductive/Obstetrics                           Anesthesia Physical Anesthesia Plan  ASA: III  Anesthesia Plan: General   Post-op Pain Management:    Induction: Intravenous  Airway Management Planned: Mask  Additional Equipment:   Intra-op Plan:   Post-operative Plan:   Informed Consent: I have reviewed the patients History and Physical, chart, labs and discussed the procedure including the risks, benefits and alternatives for the proposed anesthesia with the patient or authorized representative who has indicated his/her understanding and acceptance.     Plan Discussed with: CRNA and Anesthesiologist  Anesthesia Plan Comments:         Anesthesia Quick Evaluation

## 2012-02-16 NOTE — Preoperative (Signed)
Beta Blockers   Reason not to administer Beta Blockers:Not Applicable 

## 2012-02-16 NOTE — Progress Notes (Signed)
Bedside cardioversion performed with anesthesia and Dr.Ganji present. See Anesthesia notes. Pt tolerated procedure well. Post procedure pt stable states he feels better. Vital signs stable. Wife at bedside

## 2012-02-16 NOTE — ED Notes (Signed)
Patient denies pain and is resting comfortably.  

## 2012-02-16 NOTE — CV Procedure (Signed)
Direct current cardioversion: Indication symptomatic A. Fibrillation. Procedure: Using 80 mg of IV Propofol and 30 mg of IV lidocaine  for achieving deep (Moderate sedation), synchronized direct current cardioversion performed. Patient was delivered with 120 Joules of electricity with success to NSR. Patient tolerated the procedure well. No immediate complication noted.

## 2012-02-16 NOTE — Consult Note (Signed)
Pt is a 1 ppd smoker who is in action stage and wants to quit. Says he was quit for 12 years and then he relapsed 5 years ago. States that he needs medical aids to help him quit. Pt is in action stage. Recommended 21 mg patch to start with. Discussed patch use instructions and how to taper. Pt verbalizes understanding. Referred to 1-800 quit now for f/u and support. Discussed oral fixation substitutes, second hand smoke and in home smoking policy. Reviewed and gave pt Written education/contact information.

## 2012-02-16 NOTE — ED Provider Notes (Signed)
History     CSN: [id number]  Arrival date & time 02/16/12  0035   First MD Initiated Contact with Patient 02/16/12 [phone]      Chief Complaint  Patient presents with  . Shortness of Breath    onset 0730 this AM prior to work  . Fatigue    (Consider location/radiation/quality/duration/timing/severity/associated sxs/prior treatment) HPI History provided by patient. Palpitations with onset this morning after drinking coffee. Patient is a smoker and drinks a few sodas and coffee a day. Symptoms persistent and progressive throughout the day and unchanged tonight. He has some shortness of breath and some mild chest pressure. He has remote history of similar symptoms about 6 or 7 years ago. At that time he saw cardiologist in wore a Holter monitor without any findings. No known history of A. Fib.  No recent illness. He denies any street drugs. No back pain. No known thyroid problems. Past Medical History  Diagnosis Date  . Barrett esophagus     History reviewed. No pertinent past surgical history.  History reviewed. No pertinent family history.  History  Substance Use Topics  . Smoking status: Current Some Day Smoker  . Smokeless tobacco: Not on file  . Alcohol Use: Yes      Review of Systems  Constitutional: Negative for fever and chills.  HENT: Negative for neck pain and neck stiffness.   Eyes: Negative for pain.  Respiratory: Positive for shortness of breath.   Cardiovascular: Positive for chest pain and palpitations.  Gastrointestinal: Negative for abdominal pain.  Genitourinary: Negative for dysuria.  Musculoskeletal: Negative for back pain.  Skin: Negative for rash.  Neurological: Negative for headaches.  All other systems reviewed and are negative.    Allergies  Percocet and Vicodin  Home Medications   Current Outpatient Rx  Name Route Sig Dispense Refill  . ACETAMINOPHEN 500 MG PO TABS Oral Take 1,000 mg by mouth every 6 (six) hours as needed. pain    .  LANSOPRAZOLE 30 MG PO CPDR Oral Take 30 mg by mouth daily.      BP 114/86  Pulse 159  Temp(Src) 97.6 F (36.4 C) (Oral)  Resp 18  SpO2 96%  Physical Exam  Constitutional: He is oriented to person, place, and time. He appears well-developed and well-nourished.  HENT:  Head: Normocephalic and atraumatic.  Eyes: Conjunctivae and EOM are normal. Pupils are equal, round, and reactive to light.  Neck: Trachea normal. Neck supple. No thyromegaly present.  Cardiovascular: S1 normal, S2 normal and normal pulses.     No systolic murmur is present   No diastolic murmur is present  Pulses:      Radial pulses are 2+ on the right side, and 2+ on the left side.       Rapid heart rate irregular rhythm  Pulmonary/Chest: Effort normal and breath sounds normal. He has no wheezes. He has no rhonchi. He has no rales. He exhibits no tenderness.  Abdominal: Soft. Normal appearance and bowel sounds are normal. There is no tenderness. There is no CVA tenderness and negative Murphy's sign.  Musculoskeletal:       BLE:s Calves nontender, no cords or erythema, negative Homans sign  Neurological: He is alert and oriented to person, place, and time. He has normal strength. No cranial nerve deficit or sensory deficit. GCS eye subscore is 4. GCS verbal subscore is 5. GCS motor subscore is 6.  Skin: Skin is warm and dry. No rash noted. He is not diaphoretic.  Psychiatric: His  speech is normal.       Cooperative and appropriate    ED Course  Procedures (including critical care time)  Labs Reviewed  CBC - Abnormal; Notable for the following:    WBC 12.5 (*)    All other components within normal limits  COMPREHENSIVE METABOLIC PANEL - Abnormal; Notable for the following:    Glucose, Bld 129 (*)    All other components within normal limits  POCT I-STAT, CHEM 8 - Abnormal; Notable for the following:    Potassium 3.4 (*)    Glucose, Bld 130 (*)    All other components within normal limits  POCT I-STAT  TROPONIN I   Dg Chest Portable 1 View  02/16/2012  *RADIOLOGY REPORT*  Clinical Data: Chest pressure  PORTABLE CHEST - 1 VIEW  Comparison: None.  Findings: Heart size upper normal limits.  No focal consolidation. No pleural effusion.  No pneumothorax.  No acute osseous abnormality.  IMPRESSION: Heart size upper normal limits.  No focal consolidation.  Original Report Authenticated By: Waneta Martins, M.D.    Date: 02/16/2012  Rate: 149  Rhythm: atrial fibrillation  QRS Axis: normal  Intervals: normal  ST/T Wave abnormalities: nonspecific ST/T changes  Conduction Disutrbances:none  Narrative Interpretation:   Old EKG Reviewed: none available    1. Atrial fibrillation with rapid ventricular response    CRITICAL CARE Performed by: Sunnie Nielsen   Total critical care time: 40  Critical care time was exclusive of separately billable procedures and treating other patients.  Critical care was necessary to treat or prevent imminent or life-threatening deterioration.  Critical care was time spent personally by me on the following activities: development of treatment plan with patient and/or surrogate as well as nursing, discussions with consultants, evaluation of patient's response to treatment, examination of patient, obtaining history from patient or surrogate, ordering and performing treatments and interventions, ordering and review of laboratory studies, ordering and review of radiographic studies, pulse oximetry and re-evaluation of patient's condition. IV Cardizem drip initiated to normalize heart rate into the range of 95-110. Labs obtained and reviewed as above. Chest x-ray obtained and reviewed. Cardiology consult for admission for new onset rapid atrial fibrillation   2:50 AM case discussed with Dr. Jacinto Halim on call for unassigned cardiology. He will admit MDM   New onset rapid A. fib requiring IV Cardizem drip and cardiology admission.        Sunnie Nielsen, MD 02/16/12  [phone]

## 2012-02-16 NOTE — Transfer of Care (Signed)
Immediate Anesthesia Transfer of Care Note  Patient: Scott Townsend  Procedure(s) Performed: * No procedures listed *  Patient Location: ICU  Anesthesia Type: General  Level of Consciousness: awake, alert  and oriented  Airway & Oxygen Therapy: Patient Spontanous Breathing, Patient connected to face mask oxygen and Patient connected to face mask  Post-op Assessment: Post -op Vital signs reviewed and stable and Patient moving all extremities X 4  Post vital signs: Reviewed and stable  Complications: No apparent anesthesia complications

## 2012-02-16 NOTE — Progress Notes (Signed)
UR Completed. Simmons, Suhas Estis F [phone]  

## 2012-02-16 NOTE — H&P (Signed)
Scott Townsend is an 56 y.o. male.   Chief Complaint: Fairly healthy 56 year old male with no past history of hypertension, diabetes, hyperlipidemia was doing well until yesterday morning when he suddenly developed palpitations. Presented to the emergency department to be evaluated.  HPI: Patient has history of palpitations dating back to 6 and 7 years ago. He was evaluated in the past by Holter monitoring and stress. And since then had been doing well and used to have palpitations lasting just a few minutes. Yesterday he suddenly had palpitations that lasted for hours and would not resolve this is associated with chest heaviness and shortness of breath. Was started with IV Cardizem in the emergency department and his symptoms resolved when the heart rate. Currently she is asymptomatic. No associated syncope near syncope or neurological deficits.  Past Medical History  Diagnosis Date  . Barrett esophagus     History reviewed. No pertinent past surgical history.  Family History  Problem Relation Age of Onset  . Heart attack Father    Social History:  reports that he has been smoking.  He does not have any smokeless tobacco history on file. He reports that he drinks alcohol. He reports that he does not use illicit drugs.  Allergies:  Allergies  Allergen Reactions  . Percocet (Oxycodone-Acetaminophen) Nausea And Vomiting  . Vicodin (Hydrocodone-Acetaminophen) Nausea Only    Medications Prior to Admission  Medication Sig Dispense Refill  . acetaminophen (TYLENOL) 500 MG tablet Take 1,000 mg by mouth every 6 (six) hours as needed. pain      . lansoprazole (PREVACID) 30 MG capsule Take 30 mg by mouth daily.        Results for orders placed during the hospital encounter of 02/16/12 (from the past 48 hour(s))  COMPREHENSIVE METABOLIC PANEL     Status: Abnormal   Collection Time   02/16/12 12:41 AM      Component Value Range Comment   Sodium 140  135 - 145 (mEq/L)    Potassium 3.5  3.5 -  5.1 (mEq/L)    Chloride 105  96 - 112 (mEq/L)    CO2 19  19 - 32 (mEq/L)    Glucose, Bld 129 (*) 70 - 99 (mg/dL)    BUN 11  6 - 23 (mg/dL)    Creatinine, Ser 1.61  0.50 - 1.35 (mg/dL)    Calcium 9.4  8.4 - 10.5 (mg/dL)    Total Protein 6.5  6.0 - 8.3 (g/dL)    Albumin 3.7  3.5 - 5.2 (g/dL)    AST 17  0 - 37 (U/L)    ALT 16  0 - 53 (U/L)    Alkaline Phosphatase 53  39 - 117 (U/L)    Total Bilirubin 0.3  0.3 - 1.2 (mg/dL)    GFR calc non Af Amer >90  >90 (mL/min)    GFR calc Af Amer >90  >90 (mL/min)   CBC     Status: Abnormal   Collection Time   02/16/12 12:54 AM      Component Value Range Comment   WBC 12.5 (*) 4.0 - 10.5 (K/uL)    RBC 4.85  4.22 - 5.81 (MIL/uL)    Hemoglobin 15.6  13.0 - 17.0 (g/dL)    HCT 09.6  04.5 - 40.9 (%)    MCV 89.9  78.0 - 100.0 (fL)    MCH 32.2  26.0 - 34.0 (pg)    MCHC 35.8  30.0 - 36.0 (g/dL)    RDW 13.3  11.5 - 15.5 (%)    Platelets 213  150 - 400 (K/uL)   POCT I-STAT TROPONIN I     Status: Normal   Collection Time   02/16/12  1:03 AM      Component Value Range Comment   Troponin i, poc 0.01  0.00 - 0.08 (ng/mL)    Comment 3            POCT I-STAT, CHEM 8     Status: Abnormal   Collection Time   02/16/12  1:05 AM      Component Value Range Comment   Sodium 142  135 - 145 (mEq/L)    Potassium 3.4 (*) 3.5 - 5.1 (mEq/L)    Chloride 108  96 - 112 (mEq/L)    BUN 10  6 - 23 (mg/dL)    Creatinine, Ser 0.98  0.50 - 1.35 (mg/dL)    Glucose, Bld 119 (*) 70 - 99 (mg/dL)    Calcium, Ion 1.47  1.12 - 1.32 (mmol/L)    TCO2 20  0 - 100 (mmol/L)    Hemoglobin 15.3  13.0 - 17.0 (g/dL)    HCT 82.9  56.2 - 13.0 (%)   LIPID PANEL     Status: Normal   Collection Time   02/16/12  3:05 AM      Component Value Range Comment   Cholesterol 119  0 - 200 (mg/dL)    Triglycerides 865  <150 (mg/dL)    HDL 47  >78 (mg/dL)    Total CHOL/HDL Ratio 2.5      VLDL 21  0 - 40 (mg/dL)    LDL Cholesterol 51  0 - 99 (mg/dL)   CARDIAC PANEL(CRET KIN+CKTOT+MB+TROPI)      Status: Normal   Collection Time   02/16/12  3:05 AM      Component Value Range Comment   Total CK 89  7 - 232 (U/L)    CK, MB 3.9  0.3 - 4.0 (ng/mL)    Troponin I <0.30  <0.30 (ng/mL)    Relative Index RELATIVE INDEX IS INVALID  0.0 - 2.5    MRSA PCR SCREENING     Status: Normal   Collection Time   02/16/12  5:17 AM      Component Value Range Comment   MRSA by PCR NEGATIVE  NEGATIVE    HEPARIN LEVEL (UNFRACTIONATED)     Status: Abnormal   Collection Time   02/16/12  8:44 AM      Component Value Range Comment   Heparin Unfractionated 0.15 (*) 0.30 - 0.70 (IU/mL)    Dg Chest Portable 1 View  02/16/2012  *RADIOLOGY REPORT*  Clinical Data: Chest pressure  PORTABLE CHEST - 1 VIEW  Comparison: None.  Findings: Heart size upper normal limits.  No focal consolidation. No pleural effusion.  No pneumothorax.  No acute osseous abnormality.  IMPRESSION: Heart size upper normal limits.  No focal consolidation.  Original Report Authenticated By: Waneta Martins, M.D.   Review of Systems  Constitutional: Negative for fever, chills and malaise/fatigue.  HENT: Negative for nosebleeds.   Eyes: Negative for blurred vision.  Respiratory: Negative for cough.   Cardiovascular: Positive for palpitations. Negative for orthopnea and claudication.  Gastrointestinal: Negative for nausea and abdominal pain.  Genitourinary: Negative for dysuria.  Musculoskeletal: Negative for myalgias.  Neurological: Negative for dizziness, focal weakness and headaches.  Psychiatric/Behavioral: Negative for depression.     Blood pressure 117/74, pulse 70, temperature 98.1 F (36.7 C), temperature source Oral,  resp. rate 11, height 5\' 10"  (1.778 m), weight 97.8 kg (215 lb 9.8 oz), SpO2 95.00%. General appearance: alert, cooperative and appears stated age Eyes: conjunctivae/corneas clear. PERRL, EOM's intact. Fundi benign. Neck: no adenopathy, no carotid bruit, no JVD, supple, symmetrical, trachea midline and thyroid not  enlarged, symmetric, no tenderness/mass/nodules Neck: JVP - normal, carotids 2+= without bruits Resp: clear to auscultation bilaterally Chest wall: no tenderness Cardio: S1 is variable, S2 is normal. There is no gallop, murmur or rub. GI: soft, non-tender; bowel sounds normal; no masses,  no organomegaly Extremities: extremities normal, atraumatic, no cyanosis or edema Pulses: 2+ and symmetric Skin: Skin color, texture, turgor normal. No rashes or lesions Neurologic: Alert and oriented X 3, normal strength and tone. Normal symmetric reflexes. Normal coordination and gait   Assessment/Plan 1. New onset atrial fibrillation with rapid ventricular response. EKG essentially demonstrates underlying atrial fibrillation with controlled ventricular response. Patient is essentially asymptomatic. Onset of rate of ablation less than 24 hours.  Recommendation: I have met with the wife, discussed with the patient regarding age of ablation. I suspect he probably has lone atrial fibrillation. I have recommended that he proceed with synchronized direct current motion. I have explained to the patient regarding risks, benefits, alternatives. Patient is willing to proceed. Given the fact that his EKG is otherwise unremarkable end his physical examination is normal I do not suspect any other cardiac abnormalities. His blood pressure was well controlled on admission and his cholesterol is well controlled. Patient is willing to proceed. She understands less than 1% risk of death, stroke , need for CPR, aspiration pneumonia but not limited to these.  Pamella Pert, MD 02/16/2012, 10:17 AM

## 2012-02-16 NOTE — ED Notes (Signed)
Report called [facility] [phone]

## 2012-02-17 ENCOUNTER — Emergency Department (HOSPITAL_COMMUNITY): Payer: Self-pay

## 2012-02-17 ENCOUNTER — Encounter (HOSPITAL_COMMUNITY): Payer: Self-pay | Admitting: *Deleted

## 2012-02-17 ENCOUNTER — Emergency Department (HOSPITAL_COMMUNITY)
Admission: EM | Admit: 2012-02-17 | Discharge: 2012-02-17 | Disposition: A | Discharge status: Home / Self Care | Payer: Self-pay | Attending: Emergency Medicine | Admitting: Emergency Medicine

## 2012-02-17 DIAGNOSIS — F17200 Nicotine dependence, unspecified, uncomplicated: Secondary | ICD-10-CM | POA: Insufficient documentation

## 2012-02-17 DIAGNOSIS — I4891 Unspecified atrial fibrillation: Secondary | ICD-10-CM | POA: Insufficient documentation

## 2012-02-17 DIAGNOSIS — R0602 Shortness of breath: Secondary | ICD-10-CM | POA: Insufficient documentation

## 2012-02-17 DIAGNOSIS — R000 Tachycardia, unspecified: Secondary | ICD-10-CM | POA: Insufficient documentation

## 2012-02-17 DIAGNOSIS — R079 Chest pain, unspecified: Secondary | ICD-10-CM | POA: Insufficient documentation

## 2012-02-17 DIAGNOSIS — R0902 Hypoxemia: Secondary | ICD-10-CM | POA: Insufficient documentation

## 2012-02-17 HISTORY — DX: Unspecified atrial fibrillation: I48.91

## 2012-02-17 LAB — DIFFERENTIAL
Basophils Relative: 0 % (ref 0–1)
Eosinophils Absolute: 0 10*3/uL (ref 0.0–0.7)
Neutrophils Relative %: 83 % — ABNORMAL HIGH (ref 43–77)

## 2012-02-17 LAB — PROTIME-INR: INR: 1.06 (ref 0.00–1.49)

## 2012-02-17 LAB — URINALYSIS, ROUTINE W REFLEX MICROSCOPIC
Bilirubin Urine: NEGATIVE
Glucose, UA: NEGATIVE mg/dL
Hgb urine dipstick: NEGATIVE
Protein, ur: NEGATIVE mg/dL
Specific Gravity, Urine: 1.006 (ref 1.005–1.030)
Urobilinogen, UA: 0.2 mg/dL (ref 0.0–1.0)

## 2012-02-17 LAB — BASIC METABOLIC PANEL
Calcium: 9 mg/dL (ref 8.4–10.5)
GFR calc Af Amer: >90 mL/min (ref >90)
GFR calc non Af Amer: >90 mL/min (ref >90)
Potassium: 4.2 mEq/L (ref 3.5–5.1)
Sodium: 136 mEq/L (ref 135–145)

## 2012-02-17 LAB — CBC
MCH: 32.2 pg (ref 26.0–34.0)
MCHC: 35.3 g/dL (ref 30.0–36.0)
Platelets: 218 10*3/uL (ref 150–400)
RDW: 13 % (ref 11.5–15.5)

## 2012-02-17 LAB — CARDIAC PANEL(CRET KIN+CKTOT+MB+TROPI)
CK, MB: 3.2 ng/mL (ref 0.3–4.0)
CK, MB: 3 ng/mL (ref 0.3–4.0)
Total CK: 71 U/L (ref 7–232)
Troponin I: <0.3 ng/mL (ref <0.30)
Troponin I: <0.3 ng/mL (ref <0.30)

## 2012-02-17 MED ORDER — KETOROLAC TROMETHAMINE 30 MG/ML IJ SOLN
30.0000 mg | Freq: Once | INTRAMUSCULAR | Status: AC
  Administered 2012-02-17: 30 mg via INTRAVENOUS
  Filled 2012-02-17: qty 1

## 2012-02-17 MED ORDER — ONDANSETRON HCL 4 MG/2ML IJ SOLN
4.0000 mg | Freq: Once | INTRAMUSCULAR | Status: AC
  Administered 2012-02-17: 4 mg via INTRAVENOUS
  Filled 2012-02-17: qty 2

## 2012-02-17 MED ORDER — IOHEXOL 350 MG/ML SOLN
100.0000 mL | Freq: Once | INTRAVENOUS | Status: AC | PRN
  Administered 2012-02-17: 100 mL via INTRAVENOUS

## 2012-02-17 MED ORDER — NITROGLYCERIN 0.4 MG SL SUBL
0.4000 mg | SUBLINGUAL_TABLET | SUBLINGUAL | Status: AC | PRN
  Administered 2012-02-17 (×3): 0.4 mg via SUBLINGUAL
  Filled 2012-02-17: qty 25

## 2012-02-17 MED ORDER — IBUPROFEN 800 MG PO TABS: 800.0000 mg | ORAL_TABLET | Freq: Three times a day (TID) | ORAL | Status: AC

## 2012-02-17 MED ORDER — ASPIRIN 81 MG PO CHEW: 324.0000 mg | CHEWABLE_TABLET | Freq: Once | ORAL | Status: DC

## 2012-02-17 MED ORDER — MORPHINE SULFATE 4 MG/ML IJ SOLN
4.0000 mg | Freq: Once | INTRAMUSCULAR | Status: AC
  Administered 2012-02-17: 4 mg via INTRAVENOUS
  Filled 2012-02-17: qty 1

## 2012-02-17 NOTE — ED Notes (Signed)
Patient transported to X-ray 

## 2012-02-17 NOTE — ED Notes (Signed)
Pt c/o feeling flushed, BP noted to be lower. Fluids opened up. MD Rancour made aware.

## 2012-02-17 NOTE — Discharge Instructions (Signed)
Chest Pain (Nonspecific) There is no evidence of a heart attack or blood clot in the lung today. Called Dr. Jacinto Halim tomorrow to schedule a stress test. Your pain today is likely from the defibrillation you had yesterday.  Return to the ED if you develop new or worsening symptoms. It is often hard to give a specific diagnosis for the cause of chest pain. There is always a chance that your pain could be related to something serious, such as a heart attack or a blood clot in the lungs. You need to follow up with your caregiver for further evaluation. CAUSES   Heartburn.   Pneumonia or bronchitis.   Anxiety or stress.   Inflammation around your heart (pericarditis) or lung (pleuritis or pleurisy).   A blood clot in the lung.   A collapsed lung (pneumothorax). It can develop suddenly on its own (spontaneous pneumothorax) or from injury (trauma) to the chest.   Shingles infection (herpes zoster virus).  The chest wall is composed of bones, muscles, and cartilage. Any of these can be the source of the pain.  The bones can be bruised by injury.   The muscles or cartilage can be strained by coughing or overwork.   The cartilage can be affected by inflammation and become sore (costochondritis).  DIAGNOSIS  Lab tests or other studies, such as X-rays, electrocardiography, stress testing, or cardiac imaging, may be needed to find the cause of your pain.  TREATMENT   Treatment depends on what may be causing your chest pain. Treatment may include:   Acid blockers for heartburn.   Anti-inflammatory medicine.   Pain medicine for inflammatory conditions.   Antibiotics if an infection is present.   You may be advised to change lifestyle habits. This includes stopping smoking and avoiding alcohol, caffeine, and chocolate.   You may be advised to keep your head raised (elevated) when sleeping. This reduces the chance of acid going backward from your stomach into your esophagus.   Most of the  time, nonspecific chest pain will improve within 2 to 3 days with rest and mild pain medicine.  HOME CARE INSTRUCTIONS   If antibiotics were prescribed, take your antibiotics as directed. Finish them even if you start to feel better.   For the next few days, avoid physical activities that bring on chest pain. Continue physical activities as directed.   Do not smoke.   Avoid drinking alcohol.   Only take over-the-counter or prescription medicine for pain, discomfort, or fever as directed by your caregiver.   Follow your caregiver's suggestions for further testing if your chest pain does not go away.   Keep any follow-up appointments you made. If you do not go to an appointment, you could develop lasting (chronic) problems with pain. If there is any problem keeping an appointment, you must call to reschedule.  SEEK MEDICAL CARE IF:   You think you are having problems from the medicine you are taking. Read your medicine instructions carefully.   Your chest pain does not go away, even after treatment.   You develop a rash with blisters on your chest.  SEEK IMMEDIATE MEDICAL CARE IF:   You have increased chest pain or pain that spreads to your arm, neck, jaw, back, or abdomen.   You develop shortness of breath, an increasing cough, or you are coughing up blood.   You have severe back or abdominal pain, feel nauseous, or vomit.   You develop severe weakness, fainting, or chills.   You have  a fever.  THIS IS AN EMERGENCY. Do not wait to see if the pain will go away. Get medical help at once. Call your local emergency services (911 in [place] Do not drive yourself to the hospital. MAKE SURE YOU:   Understand these instructions.   Will watch your condition.   Will get help right away if you are not doing well or get worse.  Document Released: 07/15/2005 Document Revised: 09/24/2011 Document Reviewed: 05/10/2008 [facility] Patient Information 2012 [place], [place].

## 2012-02-17 NOTE — ED Notes (Signed)
Return from radiology- placed back on cardiac monitors.  Family at bedside.

## 2012-02-17 NOTE — ED Notes (Signed)
Pt given aspirin in route- 324 mg

## 2012-02-17 NOTE — ED Notes (Signed)
Per EMS pt from home with c/o chest pain since yesterday. States pain radiates from left side of chest to left side of back, left shoulder. Worse with breathing, movement. No nausea/vomiting/shortness of breath. EKG NSR. Seen two days ago for uncontrolled a-fib, under went a cardioversion.

## 2012-02-17 NOTE — ED Provider Notes (Signed)
History     CSN: [id number]  Arrival date & time 02/17/12  1034   First MD Initiated Contact with Patient 02/17/12 1055      Chief Complaint  Patient presents with  . Chest Pain    (Consider location/radiation/quality/duration/timing/severity/associated sxs/prior treatment) HPI Comments: Patient complains of left-sided chest pain that has been constant for several hours this morning she woke up with. It radiates to his shoulder and back. Associated with shortness of breath. Denies any nausea, vomiting. The pain is worse with movement. He was discharged yesterday after cardioversion for atrial fibrillation. He had some epigastric pressure at that time which persists. He has not had this type of pain before. Denies any history of coronary disease. Is taking Cardizem for his atrial fibrillation but no Coumadin. He denies any leg pain or swelling. Denies any fever, cough, chills, cocaine use.  The history is provided by the patient, the spouse and the EMS personnel.    Past Medical History  Diagnosis Date  . Barrett esophagus   . Atrial fibrillation     History reviewed. No pertinent past surgical history.  Family History  Problem Relation Age of Onset  . Heart attack Father     History  Substance Use Topics  . Smoking status: Current Some Day Smoker -- 1.0 packs/day for 5 years  . Smokeless tobacco: Not on file  . Alcohol Use: Yes      Review of Systems  Constitutional: Negative for fever, activity change and appetite change.  HENT: Negative for congestion and rhinorrhea.   Respiratory: Positive for chest tightness and shortness of breath. Negative for cough.   Cardiovascular: Positive for chest pain.  Gastrointestinal: Negative for nausea, vomiting and abdominal pain.  Genitourinary: Negative for dysuria.  Neurological: Negative for headaches.    Allergies  Percocet and Vicodin  Home Medications   Current Outpatient Rx  Name Route Sig Dispense Refill  .  ACETAMINOPHEN 500 MG PO TABS Oral Take 1,000 mg by mouth every 6 (six) hours as needed. pain    . ASPIRIN 325 MG PO TABS Oral Take 325 mg by mouth daily.    Marland Kitchen DILTIAZEM HCL ER COATED BEADS 180 MG PO CP24 Oral Take 180 mg by mouth daily.    Marland Kitchen LANSOPRAZOLE 30 MG PO CPDR Oral Take 30 mg by mouth daily.      BP 99/58  Pulse 99  Temp(Src) 97.7 F (36.5 C) (Oral)  Resp 30  SpO2 94%  Physical Exam  Constitutional: He is oriented to person, place, and time. He appears well-developed and well-nourished. No distress.  HENT:  Head: Normocephalic and atraumatic.  Mouth/Throat: Oropharynx is clear and moist. No oropharyngeal exudate.  Eyes: Conjunctivae and EOM are normal. Pupils are equal, round, and reactive to light.  Neck: Normal range of motion. Neck supple.  Cardiovascular: Normal rate, regular rhythm and normal heart sounds.   No murmur heard. Pulmonary/Chest: Effort normal and breath sounds normal. No respiratory distress.       Left-sided chest pain worse with arm  Abdominal: Soft. There is no tenderness. There is no rebound and no guarding.  Musculoskeletal: Normal range of motion. He exhibits no edema and no tenderness.  Neurological: He is alert and oriented to person, place, and time. No cranial nerve deficit.  Skin: Skin is warm.    ED Course  Procedures (including critical care time)  Labs Reviewed  CBC - Abnormal; Notable for the following:    WBC 17.6 (*)    All  other components within normal limits  DIFFERENTIAL - Abnormal; Notable for the following:    Neutrophils Relative 83 (*)    Neutro Abs 14.7 (*)    Lymphocytes Relative 6 (*)    Monocytes Absolute 1.8 (*)    All other components within normal limits  BASIC METABOLIC PANEL - Abnormal; Notable for the following:    Glucose, Bld 129 (*)    All other components within normal limits  D-DIMER, QUANTITATIVE - Abnormal; Notable for the following:    D-Dimer, Quant 0.63 (*)    All other components within normal  limits  CARDIAC PANEL(CRET KIN+CKTOT+MB+TROPI)  URINALYSIS, ROUTINE W REFLEX MICROSCOPIC  PROTIME-INR  CARDIAC PANEL(CRET KIN+CKTOT+MB+TROPI)   Dg Chest 2 View  02/17/2012  *RADIOLOGY REPORT*  Clinical Data: Chest pain  CHEST - 2 VIEW  Comparison: 02/16/2012  Findings: Lung volumes are low.  No edema, pneumonia, or pneumothorax. Interstitial markings are diffusely coarsened with chronic features. The cardiopericardial silhouette is within normal limits for size.  Imaged bony structures of the thorax are intact.  IMPRESSION: Stable.  No acute cardiopulmonary process.  Original Report Authenticated By: ERIC A. MANSELL, M.D.   Ct Angio Chest W/cm &/or Wo Cm  02/17/2012  *RADIOLOGY REPORT*  Clinical Data: Chest pain, shortness of breath.  Concern for pulmonary embolism.  2 days ago the patient underwent cardioversion for atrial fibrillation.  CT ANGIOGRAPHY CHEST  Technique:  Multidetector CT imaging of the chest using the standard protocol during bolus administration of intravenous contrast. Multiplanar reconstructed images including MIPs were obtained and reviewed to evaluate the vascular anatomy.  Contrast: OMNIPAQUE IOHEXOL 350 MG/ML SOLN  Comparison: Chest radiograph 02/17/2012  Findings: There are no filling defects within pulmonary arteries to suggest acute pulmonary embolism.  No acute findings of the aorta or great vessels.  There are sub centimeter pulmonary lymph nodes which are numerous but not pathologic by size criteria.  No pericardial fluid.  No axillary or supraclavicular lymphadenopathy. Review of the lung parenchyma demonstrates  bibasilar atelectasis and a trace right effusion.  Airways are normal.  Limited view of the upper abdomen demonstrates a hiatal hernia which is small.  The skeleton is unremarkable.  There is coronary calcification of the left anterior descending artery.  IMPRESSION:  1.  No evidence of acute pulmonary embolism. 2.  Atherosclerotic coronary artery disease. 3.   Prominent but not pathologically enlarged mediastinal lymph nodes are likely reactive. 4.  Small hiatal hernia.  Original Report Authenticated By: Genevive Bi, M.D.   Dg Chest Portable 1 View  02/16/2012  *RADIOLOGY REPORT*  Clinical Data: Chest pressure  PORTABLE CHEST - 1 VIEW  Comparison: None.  Findings: Heart size upper normal limits.  No focal consolidation. No pleural effusion.  No pneumothorax.  No acute osseous abnormality.  IMPRESSION: Heart size upper normal limits.  No focal consolidation.  Original Report Authenticated By: Waneta Martins, M.D.     No diagnosis found.    MDM  Constant left-sided chest pain that radiates to her shoulder and back, cardioversion yesterday for atrial fibrillation. Now normal sinus rhythm.  Patient continues to be a sinus rhythm but has persistent constant left-sided chest pain. It is worse with arm movement. He has a nonischemic EKG and negative troponin. D-dimer mildly positive which was sent in setting of tachycardia and hypoxia. CT negative for PE.  Case discussed with Dr. Jacinto Halim who discharged the patient yesterday after cardioversion. Atypical for ACS with negative troponins and EKG. Worse with arm movement. Likely  secondary to cardioversion shock. Will ambulate to ensure he does not desaturate or become tachycardic with ambulation. Ambulated with pulse ox >95%. Denies SOB with ambulation.  Pain improved with toradol. Stable for outpatient followup with Dr. Jacinto Halim this week.    Date: 02/17/2012  Rate: 94  Rhythm: normal sinus rhythm  QRS Axis: normal  Intervals: normal  ST/T Wave abnormalities: normal  Conduction Disutrbances:none  Narrative Interpretation: T wave inversion lead 3  Old EKG Reviewed: unchanged          Glynn Octave, MD 02/17/12 1527

## 2012-05-13 ENCOUNTER — Encounter: Payer: Self-pay | Admitting: Internal Medicine

## 2012-10-04 ENCOUNTER — Emergency Department (HOSPITAL_COMMUNITY): Payer: Self-pay

## 2012-10-04 ENCOUNTER — Encounter (HOSPITAL_COMMUNITY): Payer: Self-pay | Admitting: *Deleted

## 2012-10-04 ENCOUNTER — Emergency Department (HOSPITAL_COMMUNITY)
Admission: EM | Admit: 2012-10-04 | Discharge: 2012-10-04 | Disposition: A | Discharge status: Home / Self Care | Payer: Self-pay | Attending: Emergency Medicine | Admitting: Emergency Medicine

## 2012-10-04 DIAGNOSIS — F17200 Nicotine dependence, unspecified, uncomplicated: Secondary | ICD-10-CM | POA: Insufficient documentation

## 2012-10-04 DIAGNOSIS — I4891 Unspecified atrial fibrillation: Secondary | ICD-10-CM | POA: Insufficient documentation

## 2012-10-04 DIAGNOSIS — K2270 Barrett's esophagus without dysplasia: Secondary | ICD-10-CM | POA: Insufficient documentation

## 2012-10-04 LAB — CBC WITH DIFFERENTIAL/PLATELET
Basophils Absolute: 0 10*3/uL (ref 0.0–0.1)
Basophils Relative: 1 % (ref 0–1)
Eosinophils Absolute: 0.1 10*3/uL (ref 0.0–0.7)
Eosinophils Relative: 2 % (ref 0–5)
Lymphs Abs: 2 10*3/uL (ref 0.7–4.0)
MCH: 31.9 pg (ref 26.0–34.0)
MCHC: 35.3 g/dL (ref 30.0–36.0)
MCV: 90.2 fL (ref 78.0–100.0)
Neutrophils Relative %: 65 % (ref 43–77)
Platelets: 222 10*3/uL (ref 150–400)
RBC: 5.18 MIL/uL (ref 4.22–5.81)
RDW: 13.2 % (ref 11.5–15.5)

## 2012-10-04 LAB — COMPREHENSIVE METABOLIC PANEL
ALT: 22 U/L (ref 0–53)
AST: 20 U/L (ref 0–37)
Albumin: 3.8 g/dL (ref 3.5–5.2)
Alkaline Phosphatase: 56 U/L (ref 39–117)
Calcium: 9.3 mg/dL (ref 8.4–10.5)
GFR calc Af Amer: >90 mL/min (ref >90)
Potassium: 3.7 mEq/L (ref 3.5–5.1)
Sodium: 138 mEq/L (ref 135–145)
Total Protein: 7.1 g/dL (ref 6.0–8.3)

## 2012-10-04 MED ORDER — PROPOFOL 10 MG/ML IV BOLUS
INTRAVENOUS | Status: AC | PRN
  Administered 2012-10-04: 60 mg via INTRAVENOUS
  Administered 2012-10-04: 20 mg via INTRAVENOUS

## 2012-10-04 MED ORDER — METOPROLOL TARTRATE 1 MG/ML IV SOLN
INTRAVENOUS | Status: AC
  Filled 2012-10-04: qty 5

## 2012-10-04 MED ORDER — PROPOFOL 10 MG/ML IV EMUL
INTRAVENOUS | Status: DC | PRN
  Administered 2012-10-04: 2 mL via INTRAVENOUS

## 2012-10-04 MED ORDER — SODIUM CHLORIDE 0.9 % IV BOLUS (SEPSIS)
500.0000 mL | Freq: Once | INTRAVENOUS | Status: AC
  Administered 2012-10-04: 500 mL via INTRAVENOUS

## 2012-10-04 MED ORDER — METOPROLOL TARTRATE 1 MG/ML IV SOLN
INTRAVENOUS | Status: DC | PRN
  Administered 2012-10-04: 5 mg via INTRAVENOUS

## 2012-10-04 MED ORDER — DILTIAZEM HCL 100 MG IV SOLR
5.0000 mg/h | Freq: Once | INTRAVENOUS | Status: AC
  Administered 2012-10-04: 5 mg/h via INTRAVENOUS
  Filled 2012-10-04: qty 100

## 2012-10-04 MED ORDER — PROPOFOL 10 MG/ML IV BOLUS
INTRAVENOUS | Status: AC
  Filled 2012-10-04: qty 40

## 2012-10-04 NOTE — H&P (Signed)
Scott Townsend is an 56 y.o. male.   Chief Complaint: Palpitations HPI: Patient is a 56 year old Caucasian male with history of remote atrial fibrillation about 7 years ago, who was again admitted to [facility] on 02/16/2012 with the patient's and chest discomfort and was found to be in atrial fibrillation. I had performed cardioversion and should maintain sinus rhythm. He presents to the emergency department complaining of palpitations that started last night. No other associated symptoms of chest pain, shortness breath, PND or orthopnea. He generally feels poorly when he has atrial fibrillation. No fever, no nausea or vomiting. No symptoms or TIA. He denies any symptoms consistent. He is completely quit tobacco use since April of 2013. No history of illicit drug use.  Past Medical History  Diagnosis Date  . Barrett esophagus   . Atrial fibrillation     History reviewed. No pertinent past surgical history.  Family History  Problem Relation Age of Onset  . Heart attack Father    Social History:  reports that he has been smoking.  He does not have any smokeless tobacco history on file. He reports that he drinks alcohol. He reports that he does not use illicit drugs.  Allergies:  Allergies  Allergen Reactions  . Percocet (Oxycodone-Acetaminophen) Nausea And Vomiting  . Vicodin (Hydrocodone-Acetaminophen) Nausea Only   Review of Systems - General ROS: positive for  - palpitations negative for - chills, fatigue, fever, hot flashes or malaise Hematological and Lymphatic ROS: negative for - bleeding problems, blood clots or swollen lymph nodes Respiratory ROS: no cough, shortness of breath, or wheezing Cardiovascular ROS: no chest pain or dyspnea on exertion Gastrointestinal ROS: no abdominal pain, change in bowel habits, or black or bloody stools Neurological ROS: no TIA or stroke symptoms  Blood pressure 137/95, pulse 82, resp. rate 22, SpO2 99.00%. General appearance: alert,  cooperative, appears stated age and mildly obese Eyes: conjunctivae/corneas clear. PERRL, EOM's intact. Fundi benign. Neck: no adenopathy, no carotid bruit, no JVD, supple, symmetrical, trachea midline and thyroid not enlarged, symmetric, no tenderness/mass/nodules Neck: JVP - normal, carotids 2+= without bruits Resp: clear to auscultation bilaterally Chest wall: no tenderness Cardio: S1 is variable S2 is normal as no gallop or murmur. GI: soft, non-tender; bowel sounds normal; no masses,  no organomegaly Extremities: extremities normal, atraumatic, no cyanosis or edema Pulses: 2+ and symmetric Skin: Skin color, texture, turgor normal. No rashes or lesions Neurologic: Grossly normal  Results for orders placed during the hospital encounter of 10/04/12 (from the past 48 hour(s))  CBC WITH DIFFERENTIAL     Status: Normal   Collection Time   10/04/12  9:14 AM      Component Value Range Comment   WBC 8.8  4.0 - 10.5 K/uL    RBC 5.18  4.22 - 5.81 MIL/uL    Hemoglobin 16.5  13.0 - 17.0 g/dL    HCT 16.1  09.6 - 04.5 %    MCV 90.2  78.0 - 100.0 fL    MCH 31.9  26.0 - 34.0 pg    MCHC 35.3  30.0 - 36.0 g/dL    RDW 40.9  81.1 - 91.4 %    Platelets 222  150 - 400 K/uL    Neutrophils Relative 65  43 - 77 %    Neutro Abs 5.7  1.7 - 7.7 K/uL    Lymphocytes Relative 23  12 - 46 %    Lymphs Abs 2.0  0.7 - 4.0 K/uL    Monocytes  Relative 11  3 - 12 %    Monocytes Absolute 0.9  0.1 - 1.0 K/uL    Eosinophils Relative 2  0 - 5 %    Eosinophils Absolute 0.1  0.0 - 0.7 K/uL    Basophils Relative 1  0 - 1 %    Basophils Absolute 0.0  0.0 - 0.1 K/uL   COMPREHENSIVE METABOLIC PANEL     Status: Abnormal   Collection Time   10/04/12  9:14 AM      Component Value Range Comment   Sodium 138  135 - 145 mEq/L    Potassium 3.7  3.5 - 5.1 mEq/L    Chloride 105  96 - 112 mEq/L    CO2 19  19 - 32 mEq/L    Glucose, Bld 105 (*) 70 - 99 mg/dL    BUN 11  6 - 23 mg/dL    Creatinine, Ser 1.61  0.50 - 1.35 mg/dL     Calcium 9.3  8.4 - 10.5 mg/dL    Total Protein 7.1  6.0 - 8.3 g/dL    Albumin 3.8  3.5 - 5.2 g/dL    AST 20  0 - 37 U/L    ALT 22  0 - 53 U/L    Alkaline Phosphatase 56  39 - 117 U/L    Total Bilirubin 0.3  0.3 - 1.2 mg/dL    GFR calc non Af Amer >90  >90 mL/min    GFR calc Af Amer >90  >90 mL/min   POCT I-STAT TROPONIN I     Status: Normal   Collection Time   10/04/12  9:32 AM      Component Value Range Comment   Troponin i, poc 0.00  0.00 - 0.08 ng/mL    Comment 3             Dg Chest Port 1 View  10/04/2012  *RADIOLOGY REPORT*  Clinical Data: Shortness of breath  PORTABLE CHEST - 1 VIEW  Comparison: 02/17/2012  Findings: Cardiomediastinal silhouette is stable.  No acute infiltrate or pleural effusion.  No pulmonary edema.  Bony thorax is unremarkable.  IMPRESSION: No active disease.   Original Report Authenticated By: Natasha Mead, M.D.     Labs:   Lab Results  Component Value Date   WBC 8.8 10/04/2012   HGB 16.5 10/04/2012   HCT 46.7 10/04/2012   MCV 90.2 10/04/2012   PLT 222 10/04/2012    Lab 10/04/12 0914  NA 138  K 3.7  CL 105  CO2 19  BUN 11  CREATININE 0.94  CALCIUM 9.3  PROT 7.1  BILITOT 0.3  ALKPHOS 56  ALT 22  AST 20  GLUCOSE 105*   Lab Results  Component Value Date   CKTOTAL 71 02/17/2012   CKMB 3.0 02/17/2012   TROPONINI <0.30 02/17/2012    Lipid Panel     Component Value Date/Time   CHOL 119 02/16/2012 0305   TRIG 107 02/16/2012 0305   HDL 47 02/16/2012 0305   CHOLHDL 2.5 02/16/2012 0305   VLDL 21 02/16/2012 0305   LDLCALC 51 02/16/2012 0305    EKG: atrial fibrillation, rate 116-125/min. No ischemia. Normal axis..   Assessment/Plan Atrial fibrillation new-onset with rapid ventricular response. Patient has prior history of atrial fibrillation. Symptoms started last night. Patient has had a negative treadmill exercise stress test in May of 2013 and a normal echocardiogram.  Recommendation: Bedside corrosion was performed.Dr. Smitty Pluck  assisted me in providing conscious sedation. A  total of 80 mg of propofol was administered to the patient of obtaining informed consent from the patient and also his family members. 100 J of synchronized greatest he was then brought to the patient with successful reversion to sinus rhythm. Patient remained hemodynamically stable, alert oriented x3 postprocedure. He'll be watched for one hour and discharged home. I may consider outpatient EP consultation as this is recurrent urinary short time.  Pamella Pert, MD 10/04/2012, 11:35 AM [facility] Pager: [phone] Office: [phone] If no answer: Cell:  [phone]

## 2012-10-04 NOTE — ED Provider Notes (Signed)
History     CSN: [id number]  Arrival date & time 10/04/12  3664   First MD Initiated Contact with Patient 10/04/12 [phone]      Chief Complaint  Patient presents with  . Shortness of Breath    (Consider location/radiation/quality/duration/timing/severity/associated sxs/prior treatment) HPI Pt with history of paroxymal afib p/w palpitations and SOB starting last night. Pt states he knew his HR was elevated but went to bed hoping it would resolve. No CP, fever chills. No lower ext swelling. Pt states he has been complaint with his medications.  Past Medical History  Diagnosis Date  . Barrett esophagus   . Atrial fibrillation     History reviewed. No pertinent past surgical history.  Family History  Problem Relation Age of Onset  . Heart attack Father     History  Substance Use Topics  . Smoking status: Current Some Day Smoker -- 1.0 packs/day for 5 years  . Smokeless tobacco: Not on file  . Alcohol Use: Yes      Review of Systems  Constitutional: Negative for fever and chills.  Respiratory: Positive for shortness of breath. Negative for cough, chest tightness and wheezing.   Cardiovascular: Positive for palpitations. Negative for chest pain and leg swelling.  Gastrointestinal: Negative for nausea, vomiting and abdominal pain.  Musculoskeletal: Negative for back pain.  Skin: Negative for rash and wound.  Neurological: Negative for dizziness, syncope, weakness, light-headedness and headaches.  All other systems reviewed and are negative.    Allergies  Percocet and Vicodin  Home Medications   Current Outpatient Rx  Name  Route  Sig  Dispense  Refill  . ACETAMINOPHEN 500 MG PO TABS   Oral   Take 1,000 mg by mouth every 6 (six) hours as needed. pain         . ASPIRIN 325 MG PO TABS   Oral   Take 325 mg by mouth daily.         Marland Kitchen DILTIAZEM HCL ER COATED BEADS 180 MG PO CP24   Oral   Take 180 mg by mouth daily.         Marland Kitchen LANSOPRAZOLE 30 MG PO CPDR  Oral   Take 30 mg by mouth daily.           BP 128/82  Pulse 60  Resp 20  SpO2 96%  Physical Exam  Nursing note and vitals reviewed. Constitutional: He is oriented to person, place, and time. He appears well-developed and well-nourished. No distress.  HENT:  Head: Normocephalic and atraumatic.  Mouth/Throat: Oropharynx is clear and moist.  Eyes: EOM are normal. Pupils are equal, round, and reactive to light.  Neck: Normal range of motion. Neck supple.  Cardiovascular:       Tachy irregular  Pulmonary/Chest: Effort normal and breath sounds normal. No respiratory distress. He has no wheezes. He has no rales. He exhibits no tenderness.  Abdominal: Soft. Bowel sounds are normal. He exhibits no mass. There is no tenderness. There is no rebound and no guarding.  Musculoskeletal: Normal range of motion. He exhibits no edema and no tenderness.       No calf tenderness or swelling  Neurological: He is alert and oriented to person, place, and time.  Skin: Skin is warm and dry. No rash noted. No erythema.  Psychiatric: He has a normal mood and affect. His behavior is normal.    ED Course  Procedures (including critical care time)  Labs Reviewed  COMPREHENSIVE METABOLIC PANEL - Abnormal; Notable for  the following:    Glucose, Bld 105 (*)     All other components within normal limits  CBC WITH DIFFERENTIAL  POCT I-STAT TROPONIN I   Dg Chest Port 1 View  10/04/2012  *RADIOLOGY REPORT*  Clinical Data: Shortness of breath  PORTABLE CHEST - 1 VIEW  Comparison: 02/17/2012  Findings: Cardiomediastinal silhouette is stable.  No acute infiltrate or pleural effusion.  No pulmonary edema.  Bony thorax is unremarkable.  IMPRESSION: No active disease.   Original Report Authenticated By: Natasha Mead, M.D.      1. Atrial fibrillation with RVR      Date: 10/04/2012  Rate: 159  Rhythm: atrial fibrillation  QRS Axis: normal  Intervals: normal  ST/T Wave abnormalities: normal  Conduction  Disutrbances:none  Narrative Interpretation:   Old EKG Reviewed: changes noted  Procedural sedation Performed by: Ranae Palms, Morgana Rowley Consent: Verbal consent obtained. Risks and benefits: risks, benefits and alternatives were discussed Required items: required blood products, implants, devices, and special equipment available Patient identity confirmed: arm band and provided demographic data Time out: Immediately prior to procedure a "time out" was called to verify the correct patient, procedure, equipment, support staff and site/side marked as required.  Sedation type: moderate (conscious) sedation NPO time confirmed and considedered  Sedatives: PROPOFOL 100 mg  Physician Time at Bedside: 1130  Vitals: Vital signs were monitored during sedation. Cardiac Monitor, pulse oximeter Patient tolerance: Patient tolerated the procedure well with no immediate complications. Comments: Pt with uneventful recovered. Returned to pre-procedural sedation baseline  CRITICAL CARE Performed by: Ranae Palms, Bethel Gaglio   Total critical care time: 20  Critical care time was exclusive of separately billable procedures and treating other patients.  Critical care was necessary to treat or prevent imminent or life-threatening deterioration.  Critical care was time spent personally by me on the following activities: development of treatment plan with patient and/or surrogate as well as nursing, discussions with consultants, evaluation of patient's response to treatment, examination of patient, obtaining history from patient or surrogate, ordering and performing treatments and interventions, ordering and review of laboratory studies, ordering and review of radiographic studies, pulse oximetry and re-evaluation of patient's condition.   MDM   Dr Shon Baton to bedside to perform cardioversion. Pt in NSR. Cleared to d/c home and f/u per Dr Shon Baton.        Loren Racer, MD 10/04/12 [phone]

## 2012-10-04 NOTE — ED Notes (Addendum)
Shock given at 100J by dr. Jacinto Halim. Pt tolerated well. Pt remains on monitor. NAD noted. Will continue to monitor.

## 2012-10-04 NOTE — ED Notes (Signed)
NAD noted. Pt alert and oriented sitting in bed. Pt remains on monitor in sinus rhythm.

## 2012-10-04 NOTE — CV Procedure (Signed)
Direct current cardioversion:  Indication symptomatic A. Fibrillation.  Procedure: Using 80 mg of IV Propofol for achieving deep (Moderate sedation), synchronized direct current cardioversion performed. Patient was delivered with 100 Joules of electricity X 1 with success to NSR. Patient tolerated the procedure well. No immediate complication noted. Patient remained hemodynamically stable and awake and alert oriented x3 after the procedure was performed. She'll be discharged home today with outpatient followup. I may consider outpatient EP evaluation. This is a second symptomatic long atrial fibrillation episode the previous one was in April of 2013. Episode to that was 6-7 years ago.  EKG post cardioversion on 10/04/2012 reveals normal sinus rhythm at a rate of 65 beats a minute, normal axis, normal EKG.

## 2012-10-04 NOTE — ED Notes (Signed)
Pt states that he has a history of a fib that is usually controlled by medication. Pt states that since last night he feels "like its out of rhythm and racing". Pt denies chest pain. Reports SOB.

## 2013-03-23 ENCOUNTER — Telehealth: Payer: Self-pay | Admitting: Internal Medicine

## 2013-03-25 ENCOUNTER — Emergency Department (HOSPITAL_COMMUNITY): Payer: Self-pay

## 2013-03-25 ENCOUNTER — Encounter (HOSPITAL_COMMUNITY): Payer: Self-pay | Admitting: Emergency Medicine

## 2013-03-25 ENCOUNTER — Emergency Department (HOSPITAL_COMMUNITY)
Admission: EM | Admit: 2013-03-25 | Discharge: 2013-03-25 | Disposition: A | Discharge status: Home / Self Care | Payer: Self-pay | Attending: Emergency Medicine | Admitting: Emergency Medicine

## 2013-03-25 DIAGNOSIS — K2270 Barrett's esophagus without dysplasia: Secondary | ICD-10-CM | POA: Insufficient documentation

## 2013-03-25 DIAGNOSIS — R0789 Other chest pain: Secondary | ICD-10-CM | POA: Insufficient documentation

## 2013-03-25 DIAGNOSIS — Z79899 Other long term (current) drug therapy: Secondary | ICD-10-CM | POA: Insufficient documentation

## 2013-03-25 DIAGNOSIS — I4891 Unspecified atrial fibrillation: Secondary | ICD-10-CM | POA: Insufficient documentation

## 2013-03-25 LAB — POCT I-STAT, CHEM 8
Creatinine, Ser: 1 mg/dL (ref 0.50–1.35)
Glucose, Bld: 105 mg/dL — ABNORMAL HIGH (ref 70–99)
Hemoglobin: 16.3 g/dL (ref 13.0–17.0)
TCO2: 27 mmol/L (ref 0–100)

## 2013-03-25 MED ORDER — PROPOFOL 10 MG/ML IV EMUL
INTRAVENOUS | Status: AC
  Filled 2013-03-25: qty 100

## 2013-03-25 MED ORDER — PROPOFOL 10 MG/ML IV BOLUS
INTRAVENOUS | Status: AC | PRN
  Administered 2013-03-25: 50 mg via INTRAVENOUS
  Administered 2013-03-25: 20 mg via INTRAVENOUS
  Administered 2013-03-25: 30 mg via INTRAVENOUS

## 2013-03-25 MED ORDER — FLECAINIDE ACETATE 100 MG PO TABS: 100.0000 mg | ORAL_TABLET | Freq: Two times a day (BID) | ORAL | Status: DC

## 2013-03-25 MED ORDER — DILTIAZEM HCL 100 MG IV SOLR
10.0000 mg/h | INTRAVENOUS | Status: DC
  Administered 2013-03-25: 10 mg/h via INTRAVENOUS

## 2013-03-25 MED ORDER — DILTIAZEM LOAD VIA INFUSION
20.0000 mg | Freq: Once | INTRAVENOUS | Status: AC
  Administered 2013-03-25: 20 mg via INTRAVENOUS
  Filled 2013-03-25: qty 20

## 2013-03-25 MED ORDER — FLECAINIDE ACETATE 100 MG PO TABS
100.0000 mg | ORAL_TABLET | Freq: Once | ORAL | Status: AC
  Administered 2013-03-25: 100 mg via ORAL
  Filled 2013-03-25: qty 1

## 2013-03-25 MED ORDER — PROPOFOL 10 MG/ML IV BOLUS
0.5000 mg/kg | Freq: Once | INTRAVENOUS | Status: DC
  Filled 2013-03-25: qty 20

## 2013-03-25 NOTE — H&P (Signed)
Scott Townsend is an 57 y.o. male.   Chief Complaint: Palpitations HPI: Patient is a 57 year old Caucasian male with history of remote atrial fibrillation about 7 years ago, who was again admitted to [facility] on 02/16/2012, again on 10/04/2012 with  chest discomfort and palpitations was found to be in atrial fibrillation. I had performed cardioversion both times and has been doing well until this morning around 5:00 again had similar episode and presented to the emergency room. Patient again found to be in atrial fibrillation. I was called to see the patient. Due to persistent atrial fibrillation, we decided to proceed with cardioversion. Patient denies any dyspnea, dizziness, syncope, leg edema, painful swelling of the lower extremity is. He is an occasional GERD but otherwise has been doing well. No history of GI bleed or dark stools.   Past Medical History  Diagnosis Date  . Barrett esophagus   . Atrial fibrillation     History reviewed. No pertinent past surgical history.  Family History  Problem Relation Age of Onset  . Heart attack Father    Social History:  reports that he has been smoking.  He does not have any smokeless tobacco history on file. He reports that  drinks alcohol. He reports that he does not use illicit drugs.  Allergies:  Allergies  Allergen Reactions  . Percocet (Oxycodone-Acetaminophen) Nausea And Vomiting  . Vicodin (Hydrocodone-Acetaminophen) Nausea Only     (Not in a hospital admission)    Review of Systems - Negative except As dictated above in the history of present illness, other systems are negative.  Blood pressure 117/80, pulse 59, temperature 98 F (36.7 C), temperature source Oral, resp. rate 18, height 5\' 10"  (1.778 m), weight 97.8 kg (215 lb 9.8 oz), SpO2 97.00%. General appearance: alert, cooperative, appears stated age, no distress and moderately obese Eyes: Normal eye movements, equal pupils Neck: no adenopathy, no carotid  bruit, no JVD, supple, symmetrical, trachea midline and thyroid not enlarged, symmetric, no tenderness/mass/nodules Neck: JVP - normal, carotids 2+= without bruits Resp: clear to auscultation bilaterally Chest wall: no tenderness Cardio: irregularly irregular rhythm, no S3 or S4, no rub and No murmur GI: soft, non-tender; bowel sounds normal; no masses,  no organomegaly Extremities: extremities normal, atraumatic, no cyanosis or edema Pulses: 2+ and symmetric Skin: Skin color, texture, turgor normal. No rashes or lesions Neurologic: Grossly normal  Results for orders placed during the hospital encounter of 03/25/13 (from the past 48 hour(s))  POCT I-STAT TROPONIN I     Status: None   Collection Time    03/25/13  6:22 AM      Result Value Range   Troponin i, poc 0.00  0.00 - 0.08 ng/mL   Comment 3            Comment: Due to the release kinetics of cTnI,     a negative result within the first hours     of the onset of symptoms does not rule out     myocardial infarction with certainty.     If myocardial infarction is still suspected,     repeat the test at appropriate intervals.  POCT I-STAT, CHEM 8     Status: Abnormal   Collection Time    03/25/13  6:24 AM      Result Value Range   Sodium 140  135 - 145 mEq/L   Potassium 4.2  3.5 - 5.1 mEq/L   Chloride 107  96 - 112 mEq/L   BUN  12  6 - 23 mg/dL   Creatinine, Ser 0.96  0.50 - 1.35 mg/dL   Glucose, Bld 045 (*) 70 - 99 mg/dL   Calcium, Ion 4.09  8.11 - 1.23 mmol/L   TCO2 27  0 - 100 mmol/L   Hemoglobin 16.3  13.0 - 17.0 g/dL   HCT 91.4  78.2 - 95.6 %  MAGNESIUM     Status: None   Collection Time    03/25/13  6:30 AM      Result Value Range   Magnesium 2.2  1.5 - 2.5 mg/dL   Dg Chest Port 1 View  03/25/2013   *RADIOLOGY REPORT*  Clinical Data: Mild shortness of breath, cough and weakness. History of smoking.  PORTABLE CHEST - 1 VIEW  Comparison: Chest radiograph performed 10/04/2012  Findings: The lungs are well-aerated.   Mildly increased lung markings are again noted at the lung bases, likely reflecting chronic atelectasis.  There is no evidence of pleural effusion or pneumothorax.  The cardiomediastinal silhouette is borderline normal in size.  No acute osseous abnormalities are seen.  IMPRESSION: Mild chronic bibasilar atelectasis again noted; no acute cardiopulmonary process seen.   Original Report Authenticated By: Tonia Ghent, M.D.    Labs:   Lab Results  Component Value Date   WBC 8.8 10/04/2012   HGB 16.3 03/25/2013   HCT 48.0 03/25/2013   MCV 90.2 10/04/2012   PLT 222 10/04/2012    Recent Labs Lab 03/25/13 0624  NA 140  K 4.2  CL 107  BUN 12  CREATININE 1.00  GLUCOSE 105*   Lab Results  Component Value Date   CKTOTAL 71 02/17/2012   CKMB 3.0 02/17/2012   TROPONINI <0.30 02/17/2012    Lipid Panel     Component Value Date/Time   CHOL 119 02/16/2012 0305   TRIG 107 02/16/2012 0305   HDL 47 02/16/2012 0305   CHOLHDL 2.5 02/16/2012 0305   VLDL 21 02/16/2012 0305   LDLCALC 51 02/16/2012 0305    EKG: 5:56 AM, 03/25/2013: Atrial fibrillation with rapid ventricular response, nonspecific ST-T wave changes. EKG 10:07 AM: Normal sinus rhythm, normal axis, normal intervals no evidence of ischemia. Both EKGs were independently reviewed and confirmed.   Assessment/Plan 1. Paroxysmal atrial fibrillation with rapid ventricular response, symptomatic. 2. Obesity Recommendation: I performed synchronized direct current cardioversion after achieving deep sedation with 100 mg of propofol administered by Dr. Margarita Grizzle, ED physician. Patient will be discharged home on flecainide 100 mg by mouth twice a day. He'll be seen in the office in 10 days. I will set this up.  Pamella Pert, MD 03/25/2013, 10:26 AM [facility] Pager: [phone] Office: [phone] If no answer: Cell:  [phone]

## 2013-03-25 NOTE — ED Provider Notes (Addendum)
EDP provided sedation for cardiologists conversion.  Patient given propofol 1 mg /kg to obtain sedation.  Patient with good sedation.  He was observed on monitor with stable bp and hr and sats.  Patient converted per Dr. Jacinto Halim.  Patient awake and alert after procedure. Begin  )[phone] End 9:55 AM  Patient to have repeat ekg and will discharge.   Hilario Quarry, MD 03/25/13 [phone]  Patient with ekg with nsr   Date: 03/25/2013  Rate: 57  Rhythm: normal sinus rhythm  QRS Axis: normal  Intervals: normal  ST/T Wave abnormalities: normal  Conduction Disutrbances: none  Narrative Interpretation: unremarkable    Patient awake and alert and will be discharged to follow up with Dr. Jacinto Halim as per Dr. Verl Dicker instructions.   Hilario Quarry, MD 03/25/13 1059

## 2013-03-25 NOTE — ED Provider Notes (Signed)
History     CSN: [id number]  Arrival date & time 03/25/13  0551   First MD Initiated Contact with Patient 03/25/13 [phone]      Chief Complaint  Patient presents with  . Palpitations   HPI patient with a history of atrial fibrillation woke up this morning with 5:00 with palpitations and rapid heartbeat, episodes been severe, constant since 5:00, he had some mild associated shortness of breath or cough. Patient denies any recent illness. He's had no chest pain with this episode, no dizziness or lightheadedness, no changes in vision, double vision, weakness, numbness, tingling, dysarthria. No dysuria, rash, no fevers or chills. No recent illness. No nausea vomiting diarrhea or abdominal pain.  Past Medical History  Diagnosis Date  . Barrett esophagus   . Atrial fibrillation     History reviewed. No pertinent past surgical history.  Family History  Problem Relation Age of Onset  . Heart attack Father     History  Substance Use Topics  . Smoking status: Current Some Day Smoker -- 1.00 packs/day for 5 years  . Smokeless tobacco: Not on file  . Alcohol Use: Yes      Review of Systems At least 10pt or greater review of systems completed and are negative except where specified in the HPI.  Allergies  Percocet and Vicodin  Home Medications   Current Outpatient Rx  Name  Route  Sig  Dispense  Refill  . acetaminophen (TYLENOL) 500 MG tablet   Oral   Take 1,000 mg by mouth every 6 (six) hours as needed. pain         . EXPIRED: diltiazem (CARDIZEM CD) 180 MG 24 hr capsule   Oral   Take 180 mg by mouth daily.         . lansoprazole (PREVACID) 30 MG capsule   Oral   Take 30 mg by mouth daily.           There were no vitals taken for this visit.  Physical Exam  Nursing notes reviewed.  Electronic medical record reviewed. VITAL SIGNS:   Filed Vitals:   03/25/13 0605 03/25/13 0607  BP: 157/84   Pulse: 157   Temp:  98 F (36.7 C)  TempSrc:  Oral  Resp: 18    SpO2: 99%    CONSTITUTIONAL: Awake, oriented, appears non-toxic HENT: Atraumatic, normocephalic, oral mucosa pink and moist, airway patent. Nares patent without drainage. External ears normal. EYES: Conjunctiva clear, EOMI, PERRLA NECK: Trachea midline, non-tender, supple CARDIOVASCULAR: Tachycardic, irregularly irregular, No murmurs, rubs, gallops PULMONARY/CHEST: Clear to auscultation, no rhonchi, wheezes, or rales. Symmetrical breath sounds. Non-tender. ABDOMINAL: Non-distended, soft, non-tender - no rebound or guarding.  BS normal. NEUROLOGIC: Non-focal, moving all four extremities, no gross sensory or motor deficits. EXTREMITIES: No clubbing, cyanosis, or edema SKIN: Warm, Dry, No erythema, No rash  ED Course  Procedures (including critical care time)  Date: 03/25/2013  Rate: 153  Rhythm: Irregularly irregular tachycardia  QRS Axis: normal  Intervals: normal  ST/T Wave abnormalities: normal  Conduction Disutrbances: none  Narrative Interpretation: Atrial fibrillation with rapid ventricular response   Labs Reviewed  POCT I-STAT, CHEM 8 - Abnormal; Notable for the following:    Glucose, Bld 105 (*)    All other components within normal limits  MAGNESIUM  POCT I-STAT TROPONIN I   Dg Chest Port 1 View  03/25/2013   *RADIOLOGY REPORT*  Clinical Data: Mild shortness of breath, cough and weakness. History of smoking.  PORTABLE CHEST - 1  VIEW  Comparison: Chest radiograph performed 10/04/2012  Findings: The lungs are well-aerated.  Mildly increased lung markings are again noted at the lung bases, likely reflecting chronic atelectasis.  There is no evidence of pleural effusion or pneumothorax.  The cardiomediastinal silhouette is borderline normal in size.  No acute osseous abnormalities are seen.  IMPRESSION: Mild chronic bibasilar atelectasis again noted; no acute cardiopulmonary process seen.   Original Report Authenticated By: Tonia Ghent, M.D.     1. Atrial fibrillation with  rapid ventricular response       MDM  SMITH POTENZA is a 57 y.o. male present in atrial fibrillation rapid ventricular response. Patient's given 20 mg of IV Cardizem and placed on a drip of Cardizem which brought his rate down to about 100-105, no loss in blood pressure, patient is feeling better. Labs unremarkable, troponin negative. Electrolytes within normal limits. Discussed with Dr. Nadara Eaton - patient has not had anything to eat since last night at supper, and drank some 7-Up before bed at 2300 yesterday. Dr. Nadara Eaton to come to the emergency department for cardioversion in the ED.   Dr. Rosalia Hammers to perform sedation and  Final dispo.         Jones Skene, MD 03/25/13 [phone]

## 2013-03-25 NOTE — CV Procedure (Signed)
Cardioversion: Under deep sedation using 100 mg of propofol, synchronized direct current cardioversion was performed in the emergency room. Patient converted to sinus rhythm with 120 J x1 of electricity delivered. Patient tolerated the procedure well and essentially remained asymptomatic. Patient will be discharged home from the emergency room with outpatient management. Due to recurrent atrial fibrillation, I have started him on flecainide 100 mg by mouth twice a day.

## 2013-03-25 NOTE — ED Notes (Signed)
PT. WOKE UP AT 5 AM THIS MORNING WITH PALPITATIONS " HEART RACING" , DENIES CHEST PAIN OR SOB , PT. STATED HE HAS ATRIAL FIBRILLATION - HIS CARDIOLOGIST IS DR. GANDJI .

## 2013-03-25 NOTE — ED Notes (Signed)
Old and New EKG given to Dr Rulon Abide.

## 2013-03-29 ENCOUNTER — Encounter (HOSPITAL_BASED_OUTPATIENT_CLINIC_OR_DEPARTMENT_OTHER): Payer: Self-pay

## 2014-06-18 ENCOUNTER — Encounter: Payer: Self-pay | Admitting: *Deleted

## 2014-06-19 ENCOUNTER — Ambulatory Visit (INDEPENDENT_AMBULATORY_CARE_PROVIDER_SITE_OTHER): Payer: Self-pay | Admitting: Cardiology

## 2014-06-19 ENCOUNTER — Encounter: Payer: Self-pay | Admitting: Cardiology

## 2014-06-19 ENCOUNTER — Other Ambulatory Visit: Payer: Self-pay | Admitting: *Deleted

## 2014-06-19 ENCOUNTER — Encounter (HOSPITAL_COMMUNITY): Payer: Self-pay | Admitting: *Deleted

## 2014-06-19 VITALS — BP 140/84 | HR 95 | Ht 70.0 in | Wt 239.0 lb

## 2014-06-19 DIAGNOSIS — I4891 Unspecified atrial fibrillation: Secondary | ICD-10-CM

## 2014-06-19 MED ORDER — DILTIAZEM HCL ER COATED BEADS 240 MG PO CP24: 240.0000 mg | ORAL_CAPSULE | Freq: Every day | ORAL | Status: DC

## 2014-06-19 MED ORDER — FLECAINIDE ACETATE 100 MG PO TABS: 100.0000 mg | ORAL_TABLET | Freq: Two times a day (BID) | ORAL | Status: DC

## 2014-06-19 NOTE — Progress Notes (Signed)
HPI The patient presents as a new patient for evaluation of atrial fibrillation. He was seeing another cardiologist. His fibrillation started at 2013. He's been cardioverted 3 times.   First on Cardizem 180 mg, second on Cardizem 240 mg and the third time he was started on flecainide. He has had no recurrence of his fibrillation since starting flecainide.  He says he feels the palpitations when he happened. He feels anxious. He does not have any presyncope or syncope. He does not describe chest discomfort, neck or arm discomfort. He doesn't have any particular shortness of breath, PND or orthopnea. I did review the outside records and he had a normal stress echocardiogram in the past. He has not had stress testing while on flecainide. He otherwise is an active gentleman with no prior cardiac history. He doesn't exercise routinely but he does a lot of walking at work.   Allergies  Allergen Reactions  . Percocet [Oxycodone-Acetaminophen] Nausea And Vomiting  . Vicodin [Hydrocodone-Acetaminophen] Nausea Only    Current Outpatient Prescriptions  Medication Sig Dispense Refill  . aspirin 325 MG tablet Take 325 mg by mouth daily.      Marland Kitchen diltiazem (CARDIZEM CD) 240 MG 24 hr capsule Take 240 mg by mouth daily.      . diphenhydramine-acetaminophen (TYLENOL PM) 25-500 MG TABS Take 1 tablet by mouth at bedtime as needed.      . flecainide (TAMBOCOR) 100 MG tablet Take 1 tablet (100 mg total) by mouth 2 (two) times daily.  60 tablet  2  . lansoprazole (PREVACID) 30 MG capsule Take 30 mg by mouth daily.       No current facility-administered medications for this visit.    Past Medical History  Diagnosis Date  . Barrett esophagus   . Atrial fibrillation     No past surgical history on file.  Family History  Problem Relation Age of Onset  . Heart attack Father     History   Social History  . Marital Status: Married    Spouse Name: N/A    Number of Children: N/A  . Years of Education: N/A    Occupational History  . Not on file.   Social History Main Topics  . Smoking status: Current Some Day Smoker -- 1.00 packs/day for 5 years  . Smokeless tobacco: Not on file  . Alcohol Use: Yes  . Drug Use: No  . Sexual Activity:    Other Topics Concern  . Not on file   Social History Narrative  . No narrative on file    ROS:  Positive for occasional headaches, dizziness, reflux, mild ankle swelling.  Otherwise as stated in the HPI and negative for all other systems.   PHYSICAL EXAM BP 140/84  Pulse 95  Ht  (1.778 m)  Wt 239 lb (108.41 kg)  BMI 34.29 kg/m2 GENERAL:  Well appearing HEENT:  Pupils equal round and reactive, fundi not visualized, oral mucosa unremarkable NECK:  No jugular venous distention, waveform within normal limits, carotid upstroke brisk and symmetric, no bruits, no thyromegaly LYMPHATICS:  No cervical, inguinal adenopathy LUNGS:  Clear to auscultation bilaterally BACK:  No CVA tenderness CHEST:  Unremarkable HEART:  PMI not displaced or sustained,S1 and S2 within normal limits, no S3, no S4, no clicks, no rubs, no murmurs ABD:  Flat, positive bowel sounds normal in frequency in pitch, no bruits, no rebound, no guarding, no midline pulsatile mass, no hepatomegaly, no splenomegaly EXT:  2 plus pulses throughout, no edema,  no cyanosis no clubbing SKIN:  No rashes no nodules NEURO:  Cranial nerves II through XII grossly intact, motor grossly intact throughout PSYCH:  Cognitively intact, oriented to person place and time   EKG:  Sinus rhythm, rate 95, axis within normal limits, intervals within normal limits, no acute ST-T wave changes. 06/19/2014   ASSESSMENT AND PLAN  ATRIAL FIB:  We had a long discussion about the physiology of this. He's had appropriate therapy. His risk of embolism is low and he does not qualify for anticoagulation. I will check a POET (Plain Old Exercise Treadmill) to look for proarrhythmia. I will also get a trough flecainide  level.  HTN:  He does not have a history of this it is borderline today. We will keep an eye on this.  OVERWEIGHT:  The patient understands the need to lose weight with diet and exercise. We have discussed specific strategies for this.  TOBACCO:  We discussed the need to stop smoking.

## 2014-06-19 NOTE — Patient Instructions (Signed)
Your physician recommends that you schedule a follow-up appointment in: 6 months with Dr. Antoine Poche  We are ordering a stress test  We are ordering bloodwork

## 2014-06-22 ENCOUNTER — Telehealth (HOSPITAL_COMMUNITY): Payer: Self-pay

## 2014-06-22 LAB — FLECAINIDE LEVEL: FLECAINIDE: 0.28 ug/mL (ref 0.20–1.00)

## 2014-06-22 NOTE — Telephone Encounter (Signed)
Encounter complete. 

## 2014-06-27 ENCOUNTER — Ambulatory Visit (HOSPITAL_COMMUNITY)
Admission: RE | Admit: 2014-06-27 | Discharge: 2014-06-27 | Disposition: A | Discharge status: Home / Self Care | Payer: Self-pay | Source: Ambulatory Visit | Attending: Cardiology | Admitting: Cardiology

## 2014-06-27 DIAGNOSIS — E669 Obesity, unspecified: Secondary | ICD-10-CM | POA: Insufficient documentation

## 2014-06-27 DIAGNOSIS — R0989 Other specified symptoms and signs involving the circulatory and respiratory systems: Secondary | ICD-10-CM | POA: Insufficient documentation

## 2014-06-27 DIAGNOSIS — R002 Palpitations: Secondary | ICD-10-CM | POA: Insufficient documentation

## 2014-06-27 DIAGNOSIS — I4891 Unspecified atrial fibrillation: Secondary | ICD-10-CM | POA: Insufficient documentation

## 2014-06-27 DIAGNOSIS — F17200 Nicotine dependence, unspecified, uncomplicated: Secondary | ICD-10-CM | POA: Insufficient documentation

## 2014-06-27 DIAGNOSIS — R0609 Other forms of dyspnea: Secondary | ICD-10-CM | POA: Insufficient documentation

## 2014-06-27 DIAGNOSIS — Z8249 Family history of ischemic heart disease and other diseases of the circulatory system: Secondary | ICD-10-CM | POA: Insufficient documentation

## 2014-06-27 NOTE — Procedures (Signed)
Exercise Treadmill Test  Pre-Exercise Testing Evaluation   Test  Exercise Tolerance Test Ordering MD: Angelina Sheriff, MD    Unique Test No: 1   Treadmill:  1  Indication for ETT: palpitations  Contraindication to ETT: No   Stress Modality: exercise - treadmill  Cardiac Imaging Performed: non   Protocol: standard Bruce - maximal  Max BP:  203/111  Max MPHR (bpm):  163 85% MPR (bpm):  138  MPHR obtained (bpm):  151 % MPHR obtained:  92  Reached 85% MPHR (min:sec):  6:30 Total Exercise Time (min-sec):  8:16  Workload in METS:  10.1 Borg Scale: 13  Reason ETT Terminated:  dyspnea    ST Segment Analysis At Rest: normal ST segments - no evidence of significant ST depression With Exercise: no evidence of significant ST depression  Other Information Arrhythmia:  No Angina during ETT:  absent (0) Quality of ETT:  diagnostic  ETT Interpretation:  normal - no evidence of ischemia by ST analysis  Comments: The patient had an good exercise tolerance.  There was no chest pain.  There was an appropriate level of dyspnea.  There were no arrhythmias, a normal heart rate response.  There were no ischemic ST T wave changes and a normal heart rate recovery.  There was no proarrhythmic events with flecainide. He did have an accelerated blood pressure response.   Recommendations: Negative adequate ETT.  No further testing is indicated.

## 2014-12-19 ENCOUNTER — Encounter: Payer: Self-pay | Admitting: Cardiology

## 2014-12-19 ENCOUNTER — Ambulatory Visit (INDEPENDENT_AMBULATORY_CARE_PROVIDER_SITE_OTHER): Payer: Self-pay | Admitting: Cardiology

## 2014-12-19 VITALS — BP 160/85 | HR 82 | Ht 70.0 in | Wt 243.0 lb

## 2014-12-19 DIAGNOSIS — I481 Persistent atrial fibrillation: Secondary | ICD-10-CM

## 2014-12-19 DIAGNOSIS — I4819 Other persistent atrial fibrillation: Secondary | ICD-10-CM

## 2014-12-19 NOTE — Patient Instructions (Signed)
Your physician wants you to follow-up in: 1 Year. You will receive a reminder letter in the mail two months in advance. If you don't receive a letter, please call our office to schedule the follow-up appointment.  Your physician has recommended you make the following change in your medication: Decrease Aspirin to 81 mg daily   

## 2014-12-19 NOTE — Progress Notes (Signed)
   HPI The patient presents for evaluation of atrial fibrillation. His fibrillation started at 2013. He's been cardioverted 3 times.   First on Cardizem 180 mg, second on Cardizem 240 mg and the third time he was started on flecainide. He has had no recurrence of his fibrillation since starting flecainide.  He says he knows when he is in atrial fib.  He does not describe chest discomfort, neck or arm discomfort. He doesn't have any particular shortness of breath, PND or orthopnea. He had stress testing while on flecainide in the fall after I saw him and this was normal.. He otherwise is an active gentleman with no prior cardiac history.    Allergies  Allergen Reactions  . Percocet [Oxycodone-Acetaminophen] Nausea And Vomiting  . Vicodin [Hydrocodone-Acetaminophen] Nausea Only    Current Outpatient Prescriptions  Medication Sig Dispense Refill  . aspirin 325 MG tablet Take 325 mg by mouth daily.    Marland Kitchen. diltiazem (CARDIZEM CD) 240 MG 24 hr capsule Take 1 capsule (240 mg total) by mouth daily. 90 capsule 3  . diphenhydramine-acetaminophen (TYLENOL PM) 25-500 MG TABS Take 1 tablet by mouth at bedtime as needed.    . flecainide (TAMBOCOR) 100 MG tablet Take 1 tablet (100 mg total) by mouth 2 (two) times daily. 180 tablet 3  . lansoprazole (PREVACID) 30 MG capsule Take 30 mg by mouth daily.     No current facility-administered medications for this visit.    Past Medical History  Diagnosis Date  . Barrett esophagus   . Atrial fibrillation     Past Surgical History  Procedure Laterality Date  . Knee surgery Left     ROS:  As stated in the HPI and negative for all other systems.   PHYSICAL EXAM BP 160/85 mmHg  Pulse 82  Ht 5\' 10"  (1.778 m)  Wt 243 lb (110.224 kg)  BMI 34.87 kg/m2 GENERAL:  Well appearing NECK:  No jugular venous distention, waveform within normal limits, carotid upstroke brisk and symmetric, no bruits, no thyromegaly LUNGS:  Clear to auscultation bilaterally CHEST:   Unremarkable HEART:  PMI not displaced or sustained,S1 and S2 within normal limits, no S3, no S4, no clicks, no rubs, no murmurs ABD:  Flat, positive bowel sounds normal in frequency in pitch, no bruits, no rebound, no guarding, no midline pulsatile mass, no hepatomegaly, no splenomegaly EXT:  2 plus pulses throughout, no edema, no cyanosis no clubbing SKIN:  No rashes no nodules, tatoos    EKG:  Sinus rhythm, rate 74, axis within normal limits, intervals within normal limits, no acute ST-T wave changes. 12/19/2014   ASSESSMENT AND PLAN  ATRIAL FIB:  He has had no symptomatic recurrence of this. He is tolerating the meds as listed. He has not had indication for anticoagulation and we have discussed this. He'll be taking an 81 mg aspirin because he does have some moderate risk for myocardial infarction not for stroke prophylaxis.  HTN:  His blood pressure is elevated today but this is the first time we've seen this. He's gained some weight. He's going commits pounds and walking more in keeping a blood pressure diary  OVERWEIGHT:  The patient understands the need to lose weight with diet and exercise. We have discussed specific strategies for this.  TOBACCO:  He quit smoking completely.

## 2014-12-21 ENCOUNTER — Encounter: Payer: Self-pay | Admitting: Cardiology

## 2014-12-25 NOTE — Addendum Note (Signed)
Addended by: Abram SanderSIGMON, Mohid Furuya J on: 12/25/2014 04:07 PM   Modules accepted: Orders

## 2015-02-12 ENCOUNTER — Other Ambulatory Visit: Payer: Self-pay | Admitting: *Deleted

## 2015-02-12 MED ORDER — DILTIAZEM HCL ER COATED BEADS 240 MG PO CP24: 240.0000 mg | ORAL_CAPSULE | Freq: Every day | ORAL | Status: DC

## 2015-02-12 NOTE — Telephone Encounter (Signed)
Rx refill sent to patient pharmacy   

## 2015-06-26 ENCOUNTER — Other Ambulatory Visit: Payer: Self-pay | Admitting: *Deleted

## 2015-06-26 ENCOUNTER — Telehealth: Payer: Self-pay | Admitting: Cardiology

## 2015-06-26 MED ORDER — FLECAINIDE ACETATE 100 MG PO TABS: 100.0000 mg | ORAL_TABLET | Freq: Two times a day (BID) | ORAL | Status: DC

## 2015-06-26 NOTE — Telephone Encounter (Signed)
°  1. Which medications need to be refilled? Flecainide  2. Which pharmacy is medication to be sent to?[facility]   3. Do they need a 30 day or 90 day supply? 30  4. Would they like a call back once the medication has been sent to the pharmacy? Yes

## 2015-06-26 NOTE — Telephone Encounter (Signed)
Med refilled today.  Attempted to call Scott Townsend - "wrong number" Attempted to call patient mobile - "wrong number"

## 2015-06-27 ENCOUNTER — Other Ambulatory Visit: Payer: Self-pay | Admitting: *Deleted

## 2015-09-16 ENCOUNTER — Encounter: Payer: Self-pay | Admitting: Internal Medicine

## 2015-12-24 ENCOUNTER — Other Ambulatory Visit: Payer: Self-pay | Admitting: Cardiology

## 2015-12-24 NOTE — Telephone Encounter (Signed)
REFILL 

## 2015-12-25 ENCOUNTER — Other Ambulatory Visit: Payer: Self-pay | Admitting: Cardiology

## 2016-01-02 NOTE — Progress Notes (Signed)
   HPI The patient presents for evaluation of atrial fibrillation. His fibrillation started at 2013. He's been cardioverted 3 times.   First on Cardizem 180 mg, second on Cardizem 240 mg and the third time he was started on flecainide. He has had no recurrence of his fibrillation since starting flecainide.  He says he knows when he is in atrial fib.  He does not describe chest discomfort, neck or arm discomfort. He doesn't have any particular shortness of breath, PND or orthopnea. He had stress testing while on flecainide i this was normal.. He otherwise is an active gentleman with no prior cardiac history. He is on his feet all day.  He does have some leg swelling.    Allergies  Allergen Reactions  . Percocet [Oxycodone-Acetaminophen] Nausea And Vomiting  . Vicodin [Hydrocodone-Acetaminophen] Nausea Only    Current Outpatient Prescriptions  Medication Sig Dispense Refill  . aspirin EC 81 MG tablet Take 81 mg by mouth daily.    Marland Kitchen. CARTIA XT 240 MG 24 hr capsule TAKE ONE CAPSULE BY MOUTH DAILY **NEED OFFICE VISIT FOR FURTHER REFILLS** 20 capsule 0  . diphenhydramine-acetaminophen (TYLENOL PM) 25-500 MG TABS Take 1 tablet by mouth at bedtime as needed.    . flecainide (TAMBOCOR) 100 MG tablet Take 1 tablet (100 mg total) by mouth 2 (two) times daily. 180 tablet 2  . lansoprazole (PREVACID) 30 MG capsule Take 30 mg by mouth daily.     No current facility-administered medications for this visit.    Past Medical History  Diagnosis Date  . Barrett esophagus   . Atrial fibrillation     Past Surgical History  Procedure Laterality Date  . Knee surgery Left     ROS:  As stated in the HPI and negative for all other systems.   PHYSICAL EXAM BP 150/84 mmHg  Pulse 70  Ht 5\' 11"  (1.803 m)  Wt 248 lb (112.492 kg)  BMI 34.60 kg/m2 GENERAL:  Well appearing NECK:  No jugular venous distention, waveform within normal limits, carotid upstroke brisk and symmetric, no bruits, no thyromegaly LUNGS:   Clear to auscultation bilaterally CHEST:  Unremarkable HEART:  PMI not displaced or sustained,S1 and S2 within normal limits, no S3, no S4, no clicks, no rubs, no murmurs ABD:  Flat, positive bowel sounds normal in frequency in pitch, no bruits, no rebound, no guarding, no midline pulsatile mass, no hepatomegaly, no splenomegaly EXT:  2 plus pulses throughout, mild edema, no cyanosis no clubbing SKIN:  No rashes no nodules, tatoos    EKG:  Sinus rhythm, rate 70, axis within normal limits, intervals within normal limits, no acute ST-T wave changes. 01/03/2016   ASSESSMENT AND PLAN  ATRIAL FIB:  He has had no symptomatic recurrence of this. He is tolerating the meds as listed. He has not had indication for anticoagulation and we have discussed this. Mr. Brooke PaceWilliam R Franken II has a CHA2DS2 score of zero.    HTN:  His blood pressure is elevated again today.  I will add Cozaar 25 mg daily.  He will get a BMET in 10 days.    OVERWEIGHT:  The patient understands the need to lose weight with diet and exercise. We have discussed specific strategies for this.  TOBACCO:  He quit smoking completely.

## 2016-01-03 ENCOUNTER — Encounter (HOSPITAL_COMMUNITY): Payer: Self-pay | Admitting: Emergency Medicine

## 2016-01-03 ENCOUNTER — Encounter: Payer: Self-pay | Admitting: Cardiology

## 2016-01-03 ENCOUNTER — Ambulatory Visit (INDEPENDENT_AMBULATORY_CARE_PROVIDER_SITE_OTHER): Payer: Self-pay | Admitting: Cardiology

## 2016-01-03 ENCOUNTER — Emergency Department (HOSPITAL_COMMUNITY): Payer: Self-pay

## 2016-01-03 ENCOUNTER — Emergency Department (HOSPITAL_COMMUNITY)
Admission: EM | Admit: 2016-01-03 | Discharge: 2016-01-03 | Disposition: A | Discharge status: Home / Self Care | Payer: Self-pay | Attending: Emergency Medicine | Admitting: Emergency Medicine

## 2016-01-03 VITALS — BP 150/84 | HR 70 | Ht 71.0 in | Wt 248.0 lb

## 2016-01-03 DIAGNOSIS — K2270 Barrett's esophagus without dysplasia: Secondary | ICD-10-CM | POA: Insufficient documentation

## 2016-01-03 DIAGNOSIS — I4891 Unspecified atrial fibrillation: Secondary | ICD-10-CM | POA: Insufficient documentation

## 2016-01-03 DIAGNOSIS — Y998 Other external cause status: Secondary | ICD-10-CM | POA: Insufficient documentation

## 2016-01-03 DIAGNOSIS — Z79899 Other long term (current) drug therapy: Secondary | ICD-10-CM | POA: Insufficient documentation

## 2016-01-03 DIAGNOSIS — S4992XA Unspecified injury of left shoulder and upper arm, initial encounter: Secondary | ICD-10-CM | POA: Insufficient documentation

## 2016-01-03 DIAGNOSIS — X58XXXA Exposure to other specified factors, initial encounter: Secondary | ICD-10-CM | POA: Insufficient documentation

## 2016-01-03 DIAGNOSIS — Z7982 Long term (current) use of aspirin: Secondary | ICD-10-CM | POA: Insufficient documentation

## 2016-01-03 DIAGNOSIS — E785 Hyperlipidemia, unspecified: Secondary | ICD-10-CM

## 2016-01-03 DIAGNOSIS — F17210 Nicotine dependence, cigarettes, uncomplicated: Secondary | ICD-10-CM | POA: Insufficient documentation

## 2016-01-03 DIAGNOSIS — S59912A Unspecified injury of left forearm, initial encounter: Secondary | ICD-10-CM | POA: Insufficient documentation

## 2016-01-03 DIAGNOSIS — I2510 Atherosclerotic heart disease of native coronary artery without angina pectoris: Secondary | ICD-10-CM | POA: Insufficient documentation

## 2016-01-03 DIAGNOSIS — Y9389 Activity, other specified: Secondary | ICD-10-CM | POA: Insufficient documentation

## 2016-01-03 DIAGNOSIS — Y9289 Other specified places as the place of occurrence of the external cause: Secondary | ICD-10-CM | POA: Insufficient documentation

## 2016-01-03 DIAGNOSIS — S59902A Unspecified injury of left elbow, initial encounter: Secondary | ICD-10-CM | POA: Insufficient documentation

## 2016-01-03 MED ORDER — KETOROLAC TROMETHAMINE 60 MG/2ML IM SOLN
60.0000 mg | Freq: Once | INTRAMUSCULAR | Status: AC
  Administered 2016-01-03: 60 mg via INTRAMUSCULAR
  Filled 2016-01-03: qty 2

## 2016-01-03 MED ORDER — DILTIAZEM HCL ER COATED BEADS 240 MG PO CP24: 240.0000 mg | ORAL_CAPSULE | Freq: Every day | ORAL | Status: DC

## 2016-01-03 MED ORDER — NAPROXEN 500 MG PO TABS: 500.0000 mg | ORAL_TABLET | Freq: Two times a day (BID) | ORAL | Status: DC

## 2016-01-03 MED ORDER — FLECAINIDE ACETATE 100 MG PO TABS: 100.0000 mg | ORAL_TABLET | Freq: Two times a day (BID) | ORAL | Status: DC

## 2016-01-03 MED ORDER — LOSARTAN POTASSIUM 25 MG PO TABS: 25.0000 mg | ORAL_TABLET | Freq: Every day | ORAL | Status: DC

## 2016-01-03 MED ORDER — TRAMADOL HCL 50 MG PO TABS
50.0000 mg | ORAL_TABLET | Freq: Four times a day (QID) | ORAL | Status: DC | PRN
Start: 2016-01-03 — End: 2017-01-04

## 2016-01-03 NOTE — Discharge Instructions (Signed)
Biceps Tendon Disruption (Distal) The biceps tendon attaches the biceps muscle to the bones of the elbow and the shoulder. A distal biceps tendon disruption is a tear of this tendon at the end the attached near the elbow. A distal biceps tendon rupture is an uncommon injury. These injuries usually involve a complete tear of the tendon from the bone; however, partial tears are also possible. The bicep muscle works with other muscles to bend the elbow and rotate the palm upward (supinate). A complete biceps rupture will result in approximately a 30% decrease in elbow bending strength and a 40% decrease in one's ability to supinate the wrist. SYMPTOMS   Pain, tenderness, swelling, warmth, or redness at the elbow, usually in the front of the elbow.  Pain that worsens with flexion of the elbow against resistance and when straightening the elbow.  Bulge can be seen and felt in the arm.  Bruising (contusion) in the elbow or forearm after 24 hours.  Limited motion of the elbow.  Weakness with attempted elbow bending (lifting or carrying) or rotation of the wrist (like when using a screwdriver).  A crackling sound (crepitation) when the tendon or elbow is moved or touched. CAUSES  A biceps tendon rupture occurs when the tendon is subjected to a force that is greater than it can withstand, such as straightening the elbow while the biceps is contracted or direct trauma (rare). RISK INCREASES WITH:   Sports that involve contact, as well as throwing sports, gymnastics, weightlifting, and bodybuilding.  Heavy labor.  Poor strength and flexibility.  Failure to warm-up properly before activity. PREVENTION  Warm up and stretch appropriately before activity.  Maintain physical fitness:  Strength, flexibility, and endurance.  Cardiovascular fitness  Allow your body to recover between practices and competition.  Learn and use proper technique. PROGNOSIS  Surgery is usually required to fix  distal biceps tendon rupture. After surgery, a recovery period of 4 to 8 months can be expected to allow for healing and a return to sports.  RELATED COMPLICATIONS  Weakness of elbow bending and forearm rotation, especially if treated non-surgically.  Prolonged disability.  Re-rupture of the tendon after surgery.  Risks of surgery, including infection, bleeding, injury to nerves, elbow or wrist stiffness or loss of motion, and weakness of elbow bending or wrist rotation. TREATMENT  Treatment initially consists of ice and medication to help reduce pain and inflammation. A sling may also be worn to increase one's comfort. Surgery is required for a full recovery and return to sports. Surgery involves reattaching the tendon to the bone. Weakness can be expected if surgery is not performed; however, this may be acceptable for sedentary individuals. Surgery is usually followed by immobilization and rehabilitation exercises to regain strength and a full range of motion.  MEDICATION  If pain medication is necessary, nonsteroidal anti-inflammatory medications, such as aspirin and ibuprofen, or other minor pain relievers, such as acetaminophen, are often recommended.  Do not take pain medication for 7 days before surgery.  Prescription pain relievers may be given if deemed necessary by your caregiver. Use only as directed and only as much as you need. HEAT AND COLD  Cold treatment (icing) relieves pain and reduces inflammation. Cold treatment should be applied for 10 to 15 minutes every 2 to 3 hours for inflammation and pain and immediately after any activity that aggravates your symptoms. Use ice packs or an ice massage.  Heat treatment may be used prior to performing the stretching and strengthening activities prescribed  by your caregiver, physical therapist, or athletic trainer. Use a heat pack or a warm soak. SEEK MEDICAL CARE IF:   Symptoms get worse or do not improve in 2 weeks despite  treatment.  You experience pain, numbness, or coldness in the hand.  Blue, gray, or dark color appears in the fingernails.  Any of the following occur after surgery:  Increased pain, swelling, redness, drainage, or bleeding in the surgical area.  Signs of infection (headache, muscle aches, dizziness, or a general ill feeling with fever).  New, unexplained symptoms develop (drugs used in treatment may produce side effects).

## 2016-01-03 NOTE — ED Provider Notes (Signed)
2000 - Patient care assumed from Scott Townsend, [place] at shift change. He presents for L elbow pain after lifting a TV. Plan discussed with Joy, PA-C which includes d/c with ortho referral if imaging negative.  2050 - Imaging reviewed. Negative for bony injury/fracture. Sling applied and will refer to Dr. Turner Danielsowan. Findings reviewed with the patient who verbalizes understanding. He reports improvement with Toradol and icing. Return precautions given at discharge. Patient discharged in satisfactory condition with no unaddressed concerns.   Dg Elbow Complete Left  01/03/2016  CLINICAL DATA:  Left elbow pain after lifting injury. EXAM: LEFT ELBOW - COMPLETE 3+ VIEW COMPARISON:  None. FINDINGS: No fracture, joint effusion, malalignment, arthropathy or suspicious focal osseous lesion. Small enthesophytes at the lateral left distal humeral epicondyle and olecranon. IMPRESSION: No fracture, joint effusion or malalignment in the left elbow. Electronically Signed   By: Delbert PhenixJason A Poff M.D.   On: 01/03/2016 20:24   Dg Forearm Left  01/03/2016  CLINICAL DATA:  Acute pain while moving television EXAM: LEFT FOREARM - 2 VIEW COMPARISON:  None. FINDINGS: Frontal and lateral views were obtained. There is no demonstrable fracture or dislocation. No appreciable joint effusion. Incidental note is made of a minus ulnar variant. There is a minimal spur arising from the olecranon process of the proximal ulna. IMPRESSION: No fracture or dislocation.  No appreciable arthropathic change. Electronically Signed   By: Bretta BangWilliam  Woodruff III M.D.   On: 01/03/2016 20:23   Dg Humerus Left  01/03/2016  CLINICAL DATA:  Felt something pop in left forearm near the elbow, with pain about the left humerus. Initial encounter. EXAM: LEFT HUMERUS - 2+ VIEW COMPARISON:  None. FINDINGS: There is no evidence of fracture or dislocation. The left humerus appears intact. The left elbow joint is incompletely assessed, but appears grossly unremarkable. The left  humeral head remains seated at the glenoid fossa. The left acromioclavicular joint is grossly unremarkable. No definite soft tissue abnormalities are characterized on radiograph. IMPRESSION: No evidence of fracture or dislocation. Electronically Signed   By: Roanna RaiderJeffery  Chang M.D.   On: 01/03/2016 20:24      Antony MaduraKelly Saphronia Ozdemir, PA-C 01/03/16 2056  Linwood DibblesJon Knapp, MD 01/03/16 2110

## 2016-01-03 NOTE — ED Notes (Signed)
Pt states he was moving a TV and felt something pop in his L forearm near his elbow. He has pain in this area and some swelling. Pain is worse with mvmt. He rates pain 7/10. ROM intact, but painful.

## 2016-01-03 NOTE — Patient Instructions (Signed)
Your physician wants you to follow-up in: 1 Year. You will receive a reminder letter in the mail two months in advance. If you don't receive a letter, please call our office to schedule the follow-up appointment.  Your physician recommends that you return for lab work in: 10 days, CBC, BMP, Fasting Lipids  Your physician has recommended you make the following change in your medication: START Losartan 25 mg daily

## 2016-01-03 NOTE — ED Provider Notes (Signed)
CSN: [id number]     Arrival date & time 01/03/16  1846 History  By signing my name below, I, Scott Townsend, attest that this documentation has been prepared under the direction and in the presence of Bernie Ransford, PA-C. Electronically Signed: Ronney Townsend, ED Scribe. 01/03/2016. 7:57 PM.    Chief Complaint  Patient presents with  . Arm Injury   The history is provided by the patient. No language interpreter was used.    HPI Comments: Scott Townsend is a 60 y.o. male with a history of atrial fibrillation, who presents to the Emergency Department complaining of sudden-onset, constant, left elbow pain radiating up and down his arm that began when he was moving a large TV this morning and suddenly heard something "pop" in his forearm/elbow, causing him to immediately drop the TV. He notes associated swelling to the area. He rates his pain presently as 8/10. Movement exacerbates his pain. No treatments or medications were attempted. Patient denies numbness, tingling, weakness, or any other complaints.   Past Medical History  Diagnosis Date  . Barrett esophagus   . Atrial fibrillation    Past Surgical History  Procedure Laterality Date  . Knee surgery Left    Family History  Problem Relation Age of Onset  . Heart attack Father 56  . Stroke Father 70   Social History  Substance Use Topics  . Smoking status: Current Some Day Smoker -- 1.00 packs/day for 25 years    Types: Cigarettes  . Smokeless tobacco: None     Comment: Off and on   . Alcohol Use: Yes    Review of Systems  Musculoskeletal: Positive for myalgias (left forearm pain), joint swelling (Left elbow) and arthralgias (Left elbow pain).  Neurological: Negative for numbness.    Allergies  Percocet and Vicodin  Home Medications   Prior to Admission medications   Medication Sig Start Date End Date Taking? Authorizing Provider  aspirin EC 81 MG tablet Take 81 mg by mouth daily.    Historical Provider, MD  diltiazem (CARTIA  XT) 240 MG 24 hr capsule Take 1 capsule (240 mg total) by mouth daily. 01/03/16   Rollene Rotunda, MD  diphenhydramine-acetaminophen (TYLENOL PM) 25-500 MG TABS Take 1 tablet by mouth at bedtime as needed.    Historical Provider, MD  flecainide (TAMBOCOR) 100 MG tablet Take 1 tablet (100 mg total) by mouth 2 (two) times daily. 01/03/16   Rollene Rotunda, MD  lansoprazole (PREVACID) 30 MG capsule Take 30 mg by mouth daily.    Historical Provider, MD  losartan (COZAAR) 25 MG tablet Take 1 tablet (25 mg total) by mouth daily. 01/03/16   Rollene Rotunda, MD   BP 157/91 mmHg  Pulse 68  Temp(Src) 97.9 F (36.6 C) (Oral)  Resp 16  SpO2 97% Physical Exam  Constitutional: He appears well-developed and well-nourished. No distress.  HENT:  Head: Normocephalic and atraumatic.  Eyes: Conjunctivae are normal.  Cardiovascular: Normal rate, regular rhythm and intact distal pulses.   Pulmonary/Chest: Effort normal.  Musculoskeletal: He exhibits tenderness.  Exquisite tenderness on the left lateral upper arm, elbow, and upper forearm. Patient still has normal range of motion, although painful. CMS intact distal to injury.   Neurological: He is alert.  No sensory deficits. Strength 5 out of 5.  Skin: Skin is dry. He is not diaphoretic.  Nursing note and vitals reviewed.   ED Course  Procedures (including critical care time)  DIAGNOSTIC STUDIES: Oxygen Saturation is 97% on RA, normal  by my interpretation.    COORDINATION OF CARE: 7:50 PM - Discussed treatment plan with pt at bedside which includes left arm XR. If negative, will place in a sling and refer to orthopedist. Pt verbalized understanding and agreed to plan.   Imaging Review No results found. I have personally reviewed and evaluated these images as part of my medical decision-making.   MDM   Final diagnoses:  Arm injury, left, initial encounter   Scott Townsend resents with left elbow and arm pain that occurred earlier  today.  Patient's presentation is more consistent with a soft tissue injury, than an osseous injury. X-rays and pain management ordered.  8:06 PM End of shift patient care handoff report given to Antony MaduraKelly Humes, PA-C. Plan: Treat appropriately based on x-ray findings. If x-rays are normal, place patient and sling and referred to orthopedics outpatient. Nonnarcotic pain relievers for home pain management.  Filed Vitals:   01/03/16 1932  BP: 157/91  Pulse: 68  Temp: 97.9 F (36.6 C)  TempSrc: Oral  Resp: 16  SpO2: 97%     I personally performed the services described in this documentation, which was scribed in my presence. The recorded information has been reviewed and is accurate.   Anselm PancoastShawn C Mickey Hebel, PA-C 01/03/16 2008  Linwood DibblesJon Knapp, MD 01/03/16 2014

## 2016-04-27 ENCOUNTER — Encounter: Payer: Self-pay | Admitting: Cardiology

## 2016-11-10 ENCOUNTER — Encounter: Payer: Self-pay | Admitting: Cardiology

## 2016-11-23 ENCOUNTER — Telehealth: Payer: Self-pay | Admitting: *Deleted

## 2016-11-23 ENCOUNTER — Encounter: Payer: Self-pay | Admitting: Cardiology

## 2016-11-23 NOTE — Telephone Encounter (Signed)
Appointment made for pt March 19th @ 11:15 am

## 2016-11-24 ENCOUNTER — Other Ambulatory Visit: Payer: Self-pay | Admitting: *Deleted

## 2016-11-24 MED ORDER — DILTIAZEM HCL ER COATED BEADS 240 MG PO CP24: 240.0000 mg | ORAL_CAPSULE | Freq: Every day | ORAL | 0 refills | Status: DC

## 2016-12-23 ENCOUNTER — Other Ambulatory Visit: Payer: Self-pay | Admitting: Cardiology

## 2017-01-03 NOTE — Progress Notes (Signed)
HPI The patient presents for evaluation of atrial fibrillation. His fibrillation started at 2013. He's been cardioverted 3 times.   First on Cardizem 180 mg, second on Cardizem 240 mg and the third time he was started on flecainide.  Since I last saw him he has done well.  He rarely feels his heart racing but this has not been his usual atrial fib.  The patient denies any new symptoms such as chest discomfort, neck or arm discomfort. There has been no new shortness of breath, PND or orthopnea. There have been no reported palpitations, presyncope or syncope.  He is very active at work.  He has lost some weight.   Allergies  Allergen Reactions  . Percocet [Oxycodone-Acetaminophen] Nausea And Vomiting  . Vicodin [Hydrocodone-Acetaminophen] Nausea Only    Current Outpatient Prescriptions  Medication Sig Dispense Refill  . diltiazem (CARTIA XT) 240 MG 24 hr capsule Take 1 capsule (240 mg total) by mouth daily. 60 capsule 0  . diphenhydramine-acetaminophen (TYLENOL PM) 25-500 MG TABS Take 1 tablet by mouth at bedtime as needed.    . flecainide (TAMBOCOR) 100 MG tablet Take 1 tablet (100 mg total) by mouth 2 (two) times daily. 180 tablet 2  . lansoprazole (PREVACID) 30 MG capsule Take 30 mg by mouth daily.    Marland Kitchen losartan (COZAAR) 25 MG tablet TAKE 1 TABLET (25 MG TOTAL) BY MOUTH DAILY. 90 tablet 0   No current facility-administered medications for this visit.     Past Medical History:  Diagnosis Date  . Atrial fibrillation   . Barrett esophagus     Past Surgical History:  Procedure Laterality Date  . KNEE SURGERY Left     ROS:     As stated in the HPI and negative for all other systems.   PHYSICAL EXAM BP (!) 146/80   Pulse 66   Ht 5\' 10"  (1.778 m)   Wt 239 lb 3.2 oz (108.5 kg)   BMI 34.32 kg/m  GENERAL:  Well appearing NECK:  No jugular venous distention, waveform within normal limits, carotid upstroke brisk and symmetric, no bruits, no thyromegaly LUNGS:  Clear to  auscultation bilaterally CHEST:  Unremarkable HEART:  PMI not displaced or sustained,S1 and S2 within normal limits, no S3, no S4, no clicks, no rubs, no murmurs ABD:  Flat, positive bowel sounds normal in frequency in pitch, no bruits, no rebound, no guarding, no midline pulsatile mass, no hepatomegaly, no splenomegaly EXT:  2 plus pulses throughout, mild edema, no cyanosis no clubbing SKIN:  No rashes no nodules, tatoos   EKG:  Sinus rhythm, rate 66, axis within normal limits, intervals within normal limits, no acute ST-T wave changes. 01/04/2017   ASSESSMENT AND PLAN  ATRIAL FIB:  He has had no symptomatic recurrence of this. He is tolerating the meds as listed. He has not had indication for anticoagulation and we have discussed this. Mr. KATO WIECZOREK II has a CHA2DS2 score of zero.   No change in therapy is planned.    HTN:   His blood pressure is elevated again today.  I will increase Cozaar 50mg  daily.  He will get a BMET in 10 days.    OVERWEIGHT:  The patient understands the need to lose weight with diet and exercise. He will continue to work on this.  I applaud the efforts thus far.   TOBACCO:  He quit smoking completely.   RISK REDUCTION:  I will check a lipid profile as I do not have a  recent one.

## 2017-01-04 ENCOUNTER — Ambulatory Visit (INDEPENDENT_AMBULATORY_CARE_PROVIDER_SITE_OTHER): Payer: Self-pay | Admitting: Cardiology

## 2017-01-04 ENCOUNTER — Encounter: Payer: Self-pay | Admitting: Cardiology

## 2017-01-04 VITALS — BP 146/80 | HR 66 | Ht 70.0 in | Wt 239.2 lb

## 2017-01-04 DIAGNOSIS — I4819 Other persistent atrial fibrillation: Secondary | ICD-10-CM

## 2017-01-04 DIAGNOSIS — Z79899 Other long term (current) drug therapy: Secondary | ICD-10-CM

## 2017-01-04 DIAGNOSIS — E663 Overweight: Secondary | ICD-10-CM

## 2017-01-04 DIAGNOSIS — I481 Persistent atrial fibrillation: Secondary | ICD-10-CM

## 2017-01-04 DIAGNOSIS — E785 Hyperlipidemia, unspecified: Secondary | ICD-10-CM

## 2017-01-04 LAB — LIPID PANEL
CHOL/HDL RATIO: 2.6 ratio (ref <5.0)
CHOLESTEROL: 160 mg/dL (ref <200)
HDL: 61 mg/dL (ref >40)
LDL Cholesterol: 82 mg/dL (ref <100)
Triglycerides: 86 mg/dL (ref <150)
VLDL: 17 mg/dL (ref <30)

## 2017-01-04 LAB — BASIC METABOLIC PANEL
BUN: 12 mg/dL (ref 7–25)
CALCIUM: 9.8 mg/dL (ref 8.6–10.3)
CO2: 22 mmol/L (ref 20–31)
Chloride: 104 mmol/L (ref 98–110)
Creat: 0.96 mg/dL (ref 0.70–1.25)
GLUCOSE: 93 mg/dL (ref 65–99)
POTASSIUM: 4.3 mmol/L (ref 3.5–5.3)
SODIUM: 140 mmol/L (ref 135–146)

## 2017-01-04 LAB — CBC
HCT: 46.4 % (ref 38.5–50.0)
HEMOGLOBIN: 15.9 g/dL (ref 13.2–17.1)
MCH: 31.1 pg (ref 27.0–33.0)
MCHC: 34.3 g/dL (ref 32.0–36.0)
MCV: 90.8 fL (ref 80.0–100.0)
MPV: 9.7 fL (ref 7.5–12.5)
Platelets: 207 10*3/uL (ref 140–400)
RBC: 5.11 MIL/uL (ref 4.20–5.80)
RDW: 13.8 % (ref 11.0–15.0)
WBC: 9.7 10*3/uL (ref 3.8–10.8)

## 2017-01-04 MED ORDER — LOSARTAN POTASSIUM 50 MG PO TABS: 50.0000 mg | ORAL_TABLET | Freq: Every day | ORAL | 3 refills | Status: DC

## 2017-01-04 NOTE — Patient Instructions (Signed)
Medication Instructions:  INCREASE- Losartan 50 mg daily  Labwork: CBC, BMP and Fasting Lipids  Testing/Procedures: None Ordered  Follow-Up: Your physician wants you to follow-up in: 1 Year. You will receive a reminder letter in the mail two months in advance. If you don't receive a letter, please call our office to schedule the follow-up appointment.   Any Other Special Instructions Will Be Listed Below (If Applicable).   If you need a refill on your cardiac medications before your next appointment, please call your pharmacy.

## 2017-02-01 ENCOUNTER — Encounter: Payer: Self-pay | Admitting: Cardiology

## 2017-02-01 ENCOUNTER — Other Ambulatory Visit: Payer: Self-pay | Admitting: *Deleted

## 2017-02-01 ENCOUNTER — Other Ambulatory Visit: Payer: Self-pay | Admitting: Cardiology

## 2017-02-01 MED ORDER — FLECAINIDE ACETATE 100 MG PO TABS
100.0000 mg | ORAL_TABLET | Freq: Two times a day (BID) | ORAL | 2 refills | Status: DC
Start: 2017-02-01 — End: 2018-02-28

## 2017-02-21 ENCOUNTER — Other Ambulatory Visit: Payer: Self-pay | Admitting: Cardiology

## 2017-02-23 NOTE — Telephone Encounter (Signed)
Rx(s) sent to pharmacy electronically.  

## 2017-10-25 ENCOUNTER — Encounter: Payer: Self-pay | Admitting: Cardiology

## 2017-10-26 ENCOUNTER — Other Ambulatory Visit: Payer: Self-pay | Admitting: *Deleted

## 2017-10-26 MED ORDER — DILTIAZEM HCL ER COATED BEADS 240 MG PO CP24: 240.0000 mg | ORAL_CAPSULE | Freq: Every day | ORAL | 2 refills | Status: DC

## 2017-11-18 ENCOUNTER — Other Ambulatory Visit: Payer: Self-pay | Admitting: Cardiology

## 2017-11-19 NOTE — Telephone Encounter (Signed)
REFILL 

## 2017-11-23 ENCOUNTER — Ambulatory Visit: Payer: Self-pay | Admitting: *Deleted

## 2017-11-23 ENCOUNTER — Encounter (HOSPITAL_COMMUNITY): Payer: Self-pay | Admitting: Emergency Medicine

## 2017-11-23 ENCOUNTER — Emergency Department (HOSPITAL_COMMUNITY): Payer: Self-pay

## 2017-11-23 ENCOUNTER — Emergency Department (HOSPITAL_COMMUNITY)
Admission: EM | Admit: 2017-11-23 | Discharge: 2017-11-23 | Disposition: A | Discharge status: Home / Self Care | Payer: Self-pay | Attending: Emergency Medicine | Admitting: Emergency Medicine

## 2017-11-23 DIAGNOSIS — I4891 Unspecified atrial fibrillation: Secondary | ICD-10-CM | POA: Insufficient documentation

## 2017-11-23 DIAGNOSIS — F17210 Nicotine dependence, cigarettes, uncomplicated: Secondary | ICD-10-CM | POA: Insufficient documentation

## 2017-11-23 DIAGNOSIS — Y929 Unspecified place or not applicable: Secondary | ICD-10-CM | POA: Insufficient documentation

## 2017-11-23 DIAGNOSIS — I10 Essential (primary) hypertension: Secondary | ICD-10-CM | POA: Insufficient documentation

## 2017-11-23 DIAGNOSIS — Z79899 Other long term (current) drug therapy: Secondary | ICD-10-CM | POA: Insufficient documentation

## 2017-11-23 DIAGNOSIS — S9032XA Contusion of left foot, initial encounter: Secondary | ICD-10-CM | POA: Insufficient documentation

## 2017-11-23 DIAGNOSIS — Y939 Activity, unspecified: Secondary | ICD-10-CM | POA: Insufficient documentation

## 2017-11-23 DIAGNOSIS — W541XXA Struck by dog, initial encounter: Secondary | ICD-10-CM | POA: Insufficient documentation

## 2017-11-23 DIAGNOSIS — Y998 Other external cause status: Secondary | ICD-10-CM | POA: Insufficient documentation

## 2017-11-23 HISTORY — DX: Essential (primary) hypertension: I10

## 2017-11-23 MED ORDER — CEPHALEXIN 500 MG PO CAPS: 500.0000 mg | ORAL_CAPSULE | Freq: Four times a day (QID) | ORAL | 0 refills | Status: DC

## 2017-11-23 NOTE — ED Provider Notes (Signed)
[facility]-EMERGENCY DEPT Provider Note   CSN: [id number] Arrival date & time: 11/23/17  1142     History   Chief Complaint Chief Complaint  Patient presents with  . Foot Injury    HPI QUINTERIUS GAIDA II is a 62 y.o. male.  The history is provided by the patient and medical records. No language interpreter was used.  Foot Injury   Pertinent negatives include no numbness.   REIGN BARTNICK II is a 62 y.o. male  with a PMH of afib, HTN who presents to the Emergency Department complaining of left foot pain and swelling.  Patient states that his son's dogs hit his left lower foot 7 or 8 days ago.  Initially the area was sore, and a few days later the area became bruised.  Over the last 3-4 days, the top of his ankle/lower left leg has become red and feels "on fire".  He denies any open wounds around the area or bite marks from the dog.  He has kept the foot propped up and taking Tylenol for pain.  No fever or chills.  Pain with ambulation, but able to move the foot and ankle without any difficulty.  Past Medical History:  Diagnosis Date  . Atrial fibrillation   . Barrett esophagus   . Hypertension     Patient Active Problem List   Diagnosis Date Noted  . Atrial fibrillation with RVR (HCC) 02/16/2012    Past Surgical History:  Procedure Laterality Date  . KNEE SURGERY Left        Home Medications    Prior to Admission medications   Medication Sig Start Date End Date Taking? Authorizing Provider  diltiazem (CARTIA XT) 240 MG 24 hr capsule Take 1 capsule (240 mg total) by mouth daily. NEED OV. 11/19/17  Yes Rollene Rotunda, MD  diphenhydramine-acetaminophen (TYLENOL PM) 25-500 MG TABS Take 1 tablet by mouth at bedtime as needed.   Yes [provider]  flecainide (TAMBOCOR) 100 MG tablet Take 1 tablet (100 mg total) by mouth 2 (two) times daily. 02/01/17  Yes Rollene Rotunda, MD  lansoprazole (PREVACID) 30 MG capsule Take 30 mg by mouth daily.    Yes [provider]  losartan (COZAAR) 50 MG tablet Take 1 tablet (50 mg total) by mouth daily. 01/04/17  Yes Rollene Rotunda, MD  cephALEXin (KEFLEX) 500 MG capsule Take 1 capsule (500 mg total) by mouth 4 (four) times daily. 11/23/17   Ward, Chase Picket, PA-C    Family History Family History  Problem Relation Age of Onset  . Heart attack Father 37  . Stroke Father 72    Social History Social History   Tobacco Use  . Smoking status: Current Some Day Smoker    Packs/day: 1.00    Years: 25.00    Pack years: 25.00    Types: Cigarettes  . Smokeless tobacco: Never Used  . Tobacco comment: Off and on   Substance Use Topics  . Alcohol use: Yes  . Drug use: No     Allergies   Percocet [oxycodone-acetaminophen] and Vicodin [hydrocodone-acetaminophen]   Review of Systems Review of Systems  Constitutional: Negative for chills and fever.  Musculoskeletal: Positive for arthralgias, joint swelling and myalgias.  Skin: Positive for color change.  Neurological: Negative for weakness and numbness.     Physical Exam Updated Vital Signs BP (!) 156/83 (BP Location: Left Arm)   Pulse 99   Temp 97.8 F (36.6 C) (Oral)   Resp 20  Ht 5\' 10"  (1.778 m)   Wt 104.3 kg (230 lb)   SpO2 96%   BMI 33.00 kg/m   Physical Exam  Constitutional: He appears well-developed and well-nourished. No distress.  HENT:  Head: Normocephalic and atraumatic.  Neck: Neck supple.  Cardiovascular: Normal rate, regular rhythm and normal heart sounds.  No murmur heard. Pulmonary/Chest: Effort normal and breath sounds normal. No respiratory distress. He has no wheezes. He has no rales.  Musculoskeletal: Normal range of motion.  Left lower extremity with full range of motion and full strength.  2+ DP.  Sensation intact.  Medial aspect of the lower left leg is erythematous and warm to the touch. + swelling. There are no open wounds appreciated.  He also has ecchymosis to the second through fourth  toes without any tenderness.  Neurological: He is alert.  Skin: Skin is warm and dry.  Nursing note and vitals reviewed.    ED Treatments / Results  Labs (all labs ordered are listed, but only abnormal results are displayed) Labs Reviewed - No data to display  EKG  EKG Interpretation None       Radiology Dg Tibia/fibula Left  Result Date: 11/23/2017 CLINICAL DATA:  Left leg pain after a large dog ran into him on Sunday. EXAM: LEFT FOOT - COMPLETE 3+ VIEW; LEFT ANKLE COMPLETE - 3+ VIEW; LEFT TIBIA AND FIBULA - 2 VIEW COMPARISON:  None. FINDINGS: Left tibia fibula: No acute fracture or malalignment. Small osteochondroma arising from the lateral aspect of the proximal tibial diaphysis. Bone mineralization is normal. Soft tissues are unremarkable. Left foot and ankle: No acute fracture or malalignment. The talar dome is intact. The ankle mortise is symmetric. Mild diffuse soft tissue swelling about the lower leg and ankle. No ankle joint effusion. Bone mineralization is normal. Large plantar enthesophyte. IMPRESSION: 1. No acute osseous abnormality of the left lower leg, ankle, and foot. 2. 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 Electronically Signed   By: Obie Dredge M.D.   On: 11/23/2017 12:32   Dg Ankle Complete Left  Result Date: 11/23/2017 CLINICAL DATA:  Left leg pain after a large dog ran into him on Sunday. EXAM: LEFT FOOT - COMPLETE 3+ VIEW; LEFT ANKLE COMPLETE - 3+ VIEW; LEFT TIBIA AND FIBULA - 2 VIEW COMPARISON:  None. FINDINGS: Left tibia fibula: No acute fracture or malalignment. Small osteochondroma arising from the lateral aspect of the proximal tibial diaphysis. Bone mineralization is normal. Soft tissues are unremarkable. Left foot and ankle: No acute fracture or malalignment. The talar dome is intact. The ankle mortise is symmetric. Mild diffuse soft tissue swelling about the lower leg and ankle. No ankle joint  effusion. Bone mineralization is normal. Large plantar enthesophyte. IMPRESSION: 1. No acute osseous abnormality of the left lower leg, ankle, and foot. 2. 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 Electronically Signed   By: Obie Dredge M.D.   On: 11/23/2017 12:32   Dg Foot Complete Left  Result Date: 11/23/2017 CLINICAL DATA:  Left leg pain after a large dog ran into him on Sunday. EXAM: LEFT FOOT - COMPLETE 3+ VIEW; LEFT ANKLE COMPLETE - 3+ VIEW; LEFT TIBIA AND FIBULA - 2 VIEW COMPARISON:  None. FINDINGS: Left tibia fibula: No acute fracture or malalignment. Small osteochondroma arising from the lateral aspect of the proximal tibial diaphysis. Bone mineralization is normal. Soft tissues are unremarkable. Left foot and ankle: No acute fracture or  malalignment. The talar dome is intact. The ankle mortise is symmetric. Mild diffuse soft tissue swelling about the lower leg and ankle. No ankle joint effusion. Bone mineralization is normal. Large plantar enthesophyte. IMPRESSION: 1. No acute osseous abnormality of the left lower leg, ankle, and foot. 2. 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 Electronically Signed   By: Obie DredgeWilliam T Derry M.D.   On: 11/23/2017 12:32    Procedures Procedures (including critical care time)  Medications Ordered in ED Medications - No data to display   Initial Impression / Assessment and Plan / ED Course  I have reviewed the triage vital signs and the nursing notes.  Pertinent labs & imaging results that were available during my care of the patient were reviewed by me and considered in my medical decision making (see chart for details).    Brooke PaceWilliam R Corrales II is a 62 y.o. male who presents to ED for left lower leg pain.  He initially was hit by a large dog's head causing a contusion to the area 7-8 days ago.  He denies any open wounds or bite marks at that time.   Over the last 2-3 days, the medial aspect of his left leg has become red, warm and swollen.  On exam, he has full range of motion and full strength.  Extremity is neurovascularly intact.  Medial aspect of his leg is erythematous, warm to the touch and slightly swollen.  I do not appreciate any signs of bite marks or wounds.  This does appear cellulitic, therefore will start on Keflex. Symptomatic home care instructions discussed.  He has a primary care doctor whom he can follow up with. Recommend calling for recheck within 1 week, preferably in 3 days. Reasons to return to ER discussed and all questions answered.    Final Clinical Impressions(s) / ED Diagnoses   Final diagnoses:  Contusion of left foot, initial encounter    ED Discharge Orders        Ordered    cephALEXin (KEFLEX) 500 MG capsule  4 times daily     11/23/17 1358       Ward, Chase PicketJaime Pilcher, PA-C 11/23/17 1404    Linwood DibblesKnapp, Jon, MD 11/24/17 [phone]

## 2017-11-23 NOTE — ED Triage Notes (Signed)
Patient sons were at his home 2 sundays ago and had their big dogs who were playing around and his left ankle/lower leg got hit by the dogs and patient reports had pain but thought would go away. Reports that pain ans now swelling is getting worse. From toes to lower leg area. Pain is worse with weight bearing.

## 2017-11-23 NOTE — Telephone Encounter (Signed)
Called in c/o his left leg below the calf in the ankle area was hit by a big dog a week and a half ago during a dog fight.   It is now red and swollen in the ankle and foot.  He also said he had black areas under his foot and his toes.  He also has redness in the foot now.     He is also c/o a burning in his left foot too.  I instructed him to go to the ED to be evaluated.   He agreed to this and [facility]  He does not have a primary care physician.  Reason for Disposition . Patient sounds very sick or weak to the triager  Answer Assessment - Initial Assessment Questions 1. LOCATION: "Which joint is swollen?"     Left ankle and foot was hit by a big dog a week and a half ago.  It is now swollen and black.  Bottom of foot black and toes too.    2. ONSET: "When did the swelling start?"     A week and a half ago 3. SIZE: "How large is the swelling?"     My foot is twice the size of the other one. 4. PAIN: "Is there any pain?" If so, ask: "How bad is it?" (Scale 1-10; or mild, moderate, severe)     It really hurts bad I'm feeling sick. 5. CAUSE: "What do you think caused the swollen joint?"     I was hit by a dog trying to break up a dog fight. 6. OTHER SYMPTOMS: "Do you have any other symptoms?" (e.g., fever, chest pain, difficulty breathing, calf pain)     It's now red.  7. PREGNANCY: "Is there any chance you are pregnant?" "When was your last menstrual period?"     N/A  Protocols used: ANKLE SWELLING-A-AH

## 2017-11-23 NOTE — Discharge Instructions (Signed)
It was my pleasure taking care of you today!   Please take all of your antibiotics until finished!  Ice area for swelling relief. Keep elevated as we discussed as much as possible.  Please call your primary care doctor today or first thing in the morning to schedule a follow up appointment for recheck of the leg within 1 week.   Return to ER for fevers, new or worsening symptoms, any additional concerns.

## 2018-01-05 ENCOUNTER — Encounter: Payer: Self-pay | Admitting: Cardiology

## 2018-01-07 ENCOUNTER — Ambulatory Visit: Payer: Self-pay | Admitting: Cardiology

## 2018-01-11 NOTE — Progress Notes (Signed)
    HPI The patient presents for evaluation of atrial fibrillation. His fibrillation started at 2013. He's been cardioverted 3 times.   First on Cardizem 180 mg, second on Cardizem 240 mg and the third time he was started on flecainide.  Since I last saw him he has had a couple of probable paroxysms.  One a few months ago caused some lightheadedness and he had to sit down at work although this was particularly unusual.  He is otherwise not had any presyncope or syncope.  He said these episodes have lasted for only a few minutes.   Allergies  Allergen Reactions  . Percocet [Oxycodone-Acetaminophen] Nausea And Vomiting  . Vicodin [Hydrocodone-Acetaminophen] Nausea Only    Current Outpatient Medications  Medication Sig Dispense Refill  . cephALEXin (KEFLEX) 500 MG capsule Take 1 capsule (500 mg total) by mouth 4 (four) times daily. 40 capsule 0  . diltiazem (CARTIA XT) 240 MG 24 hr capsule Take 1 capsule (240 mg total) by mouth daily. NEED OV. 30 capsule 1  . diphenhydramine-acetaminophen (TYLENOL PM) 25-500 MG TABS Take 1 tablet by mouth at bedtime as needed.    . flecainide (TAMBOCOR) 100 MG tablet Take 1 tablet (100 mg total) by mouth 2 (two) times daily. 180 tablet 2  . lansoprazole (PREVACID) 30 MG capsule Take 30 mg by mouth daily.    . ramipril (ALTACE) 5 MG capsule Take 1 capsule (5 mg total) by mouth daily. 90 capsule 3   No current facility-administered medications for this visit.     Past Medical History:  Diagnosis Date  . Atrial fibrillation   . Barrett esophagus   . Hypertension     Past Surgical History:  Procedure Laterality Date  . KNEE SURGERY Left     ROS:     As stated in the HPI and negative for all other systems.   PHYSICAL EXAM BP (!) 148/82   Pulse 69   Ht 5\' 10"  (1.778 m)   Wt 240 lb 3.2 oz (109 kg)   BMI 34.47 kg/m   GENERAL:  Well appearing NECK:  No jugular venous distention, waveform within normal limits, carotid upstroke brisk and symmetric, no  bruits, no thyromegaly LUNGS:  Clear to auscultation bilaterally CHEST:  Unremarkable HEART:  PMI not displaced or sustained,S1 and S2 within normal limits, no S3, no S4, no clicks, no rubs, no murmurs ABD:  Flat, positive bowel sounds normal in frequency in pitch, no bruits, no rebound, no guarding, no midline pulsatile mass, no hepatomegaly, no splenomegaly EXT:  2 plus pulses throughout, no edema, no cyanosis no clubbing   EKG: Sinus rhythm, rate 69, axis within normal limits, intervals within normal limits, no acute ST-T wave changes.  ASSESSMENT AND PLAN  ATRIAL FIB:    He has had few symptomatic paroxysms of this.   He has had no symptomatic recurrence of this. He is tolerating the meds as listed. He has not had indication for anticoagulation and we have discussed this. Scott Townsend has a CHA2DS2 score of 1.   No change in therapy is planned.    HTN:   His blood pressures not well controlled.  He has had recall notices from his pharmacy on his Cozaar.  Therefore, I will switch him to Altace 5 mg twice daily.  OVERWEIGHT:    We talked about weight loss.  RISK REDUCTION:  Lipids are good with an LDL of 82 and HDL of 61

## 2018-01-13 ENCOUNTER — Other Ambulatory Visit: Payer: Self-pay | Admitting: *Deleted

## 2018-01-13 ENCOUNTER — Encounter: Payer: Self-pay | Admitting: Cardiology

## 2018-01-13 ENCOUNTER — Encounter: Payer: Self-pay | Admitting: *Deleted

## 2018-01-13 ENCOUNTER — Ambulatory Visit (INDEPENDENT_AMBULATORY_CARE_PROVIDER_SITE_OTHER): Payer: Self-pay | Admitting: Cardiology

## 2018-01-13 VITALS — BP 148/82 | HR 69 | Ht 70.0 in | Wt 240.2 lb

## 2018-01-13 DIAGNOSIS — I480 Paroxysmal atrial fibrillation: Secondary | ICD-10-CM

## 2018-01-13 DIAGNOSIS — I10 Essential (primary) hypertension: Secondary | ICD-10-CM

## 2018-01-13 MED ORDER — RAMIPRIL 5 MG PO CAPS
5.0000 mg | ORAL_CAPSULE | Freq: Two times a day (BID) | ORAL | 3 refills | Status: DC
Start: 2018-01-13 — End: 2019-01-06

## 2018-01-13 MED ORDER — RAMIPRIL 5 MG PO CAPS: 5.0000 mg | ORAL_CAPSULE | Freq: Every day | ORAL | 3 refills | Status: DC

## 2018-01-13 NOTE — Addendum Note (Signed)
Addended by: Chana BodeGREEN, Syris Brookens L on: 01/13/2018 12:15 PM   Modules accepted: Orders

## 2018-01-13 NOTE — Telephone Encounter (Signed)
This encounter was created in error - please disregard.

## 2018-01-13 NOTE — Patient Instructions (Signed)
Medication Instructions:  STOP- Losartan START- Ramipril(Altace) 5 mg twice a day  If you need a refill on your cardiac medications before your next appointment, please call your pharmacy.  Labwork: None Ordered   Testing/Procedures: None Ordered  Follow-Up: Your physician wants you to follow-up in: 1 Year. You should receive a reminder letter in the mail two months in advance. If you do not receive a letter, please call our office [phone].    Thank you for choosing [facility]!!

## 2018-01-13 NOTE — Telephone Encounter (Signed)
Per office note today, needs Altace 5 mg at BID not Daily   ASSESSMENT AND PLAN  ATRIAL FIB:    He has had few symptomatic paroxysms of this.   He has had no symptomatic recurrence of this. He is tolerating the meds as listed. He has not had indication for anticoagulation and we have discussed this. [name] has a CHA2DS2 score of 1.   No change in therapy is planned.    HTN:   His blood pressures not well controlled.  He has had recall notices from his pharmacy on his Cozaar.  Therefore, I will switch him to Altace 5 mg twice daily

## 2018-02-26 ENCOUNTER — Other Ambulatory Visit: Payer: Self-pay | Admitting: Cardiology

## 2018-02-26 ENCOUNTER — Encounter: Payer: Self-pay | Admitting: Cardiology

## 2018-02-28 MED ORDER — FLECAINIDE ACETATE 100 MG PO TABS: 100.0000 mg | ORAL_TABLET | Freq: Two times a day (BID) | ORAL | 2 refills | Status: DC

## 2018-04-19 ENCOUNTER — Encounter: Payer: Self-pay | Admitting: Cardiology

## 2018-04-19 ENCOUNTER — Other Ambulatory Visit: Payer: Self-pay | Admitting: Cardiology

## 2018-04-20 ENCOUNTER — Other Ambulatory Visit: Payer: Self-pay | Admitting: *Deleted

## 2018-04-20 MED ORDER — DILTIAZEM HCL ER COATED BEADS 240 MG PO CP24: 240.0000 mg | ORAL_CAPSULE | Freq: Every day | ORAL | 3 refills | Status: DC

## 2018-09-26 ENCOUNTER — Emergency Department (HOSPITAL_BASED_OUTPATIENT_CLINIC_OR_DEPARTMENT_OTHER)
Admission: EM | Admit: 2018-09-26 | Discharge: 2018-09-26 | Disposition: A | Discharge status: Home / Self Care | Payer: Worker's Compensation | Attending: Emergency Medicine | Admitting: Emergency Medicine

## 2018-09-26 ENCOUNTER — Encounter (HOSPITAL_BASED_OUTPATIENT_CLINIC_OR_DEPARTMENT_OTHER): Payer: Self-pay | Admitting: *Deleted

## 2018-09-26 ENCOUNTER — Emergency Department (HOSPITAL_BASED_OUTPATIENT_CLINIC_OR_DEPARTMENT_OTHER): Payer: Worker's Compensation

## 2018-09-26 ENCOUNTER — Other Ambulatory Visit: Payer: Self-pay

## 2018-09-26 DIAGNOSIS — W231XXA Caught, crushed, jammed, or pinched between stationary objects, initial encounter: Secondary | ICD-10-CM | POA: Diagnosis not present

## 2018-09-26 DIAGNOSIS — Y929 Unspecified place or not applicable: Secondary | ICD-10-CM | POA: Insufficient documentation

## 2018-09-26 DIAGNOSIS — I10 Essential (primary) hypertension: Secondary | ICD-10-CM | POA: Diagnosis not present

## 2018-09-26 DIAGNOSIS — Z23 Encounter for immunization: Secondary | ICD-10-CM | POA: Insufficient documentation

## 2018-09-26 DIAGNOSIS — F17210 Nicotine dependence, cigarettes, uncomplicated: Secondary | ICD-10-CM | POA: Insufficient documentation

## 2018-09-26 DIAGNOSIS — S61214A Laceration without foreign body of right ring finger without damage to nail, initial encounter: Secondary | ICD-10-CM | POA: Insufficient documentation

## 2018-09-26 DIAGNOSIS — Y990 Civilian activity done for income or pay: Secondary | ICD-10-CM | POA: Insufficient documentation

## 2018-09-26 DIAGNOSIS — Y939 Activity, unspecified: Secondary | ICD-10-CM | POA: Diagnosis not present

## 2018-09-26 DIAGNOSIS — Z79899 Other long term (current) drug therapy: Secondary | ICD-10-CM | POA: Diagnosis not present

## 2018-09-26 MED ORDER — LIDOCAINE HCL (PF) 1 % IJ SOLN
10.0000 mL | Freq: Once | INTRAMUSCULAR | Status: AC
  Administered 2018-09-26: 10 mL
  Filled 2018-09-26: qty 10

## 2018-09-26 MED ORDER — TETANUS-DIPHTH-ACELL PERTUSSIS 5-2.5-18.5 LF-MCG/0.5 IM SUSP
0.5000 mL | Freq: Once | INTRAMUSCULAR | Status: AC
Start: 2018-09-26 — End: 2018-09-26
  Administered 2018-09-26: 0.5 mL via INTRAMUSCULAR
  Filled 2018-09-26: qty 0.5

## 2018-09-26 NOTE — Discharge Instructions (Signed)
You will need your stitches removed in about 7 days, you may shower but do not submerge her hand underwater.  Keep the area clean and dry and covered with a dressing, monitor for any signs of infection such as redness, swelling, increasing pain, drainage or fever or any other new or concerning symptoms.

## 2018-09-26 NOTE — ED Triage Notes (Signed)
Pt reports cutting his right 4th digit on a clamp while at work. No active bleeding at this time, unsure of last td.

## 2018-09-26 NOTE — ED Provider Notes (Signed)
[facility] EMERGENCY DEPARTMENT Provider Note   CSN: [id number] Arrival date & time: 09/26/18  0950     History   Chief Complaint Chief Complaint  Patient presents with  . Finger Injury    HPI Scott Townsend is a 62 y.o. male.  Scott Townsend is a 62 y.o. Male with a history of A. fib, hypertension Barrett's esophagus, who presents to the emergency department for evaluation of a laceration to the right fourth finger.  He reports this occurred just prior to arrival when he smashed his finger with a clamp at work.  He was able to get bleeding controlled with pressure, he is not on any blood thinners.  He reports mild pain surrounding the laceration, no numbness, tingling or weakness and patient is able to move the fingers without difficulty.  No injury to any other fingers and he has not noticed any evidence of injury or laceration to the nail.  Unsure when his last tetanus vaccination was.     Past Medical History:  Diagnosis Date  . Atrial fibrillation   . Barrett esophagus   . Hypertension     Patient Active Problem List   Diagnosis Date Noted  . Atrial fibrillation with RVR (HCC) 02/16/2012    Past Surgical History:  Procedure Laterality Date  . KNEE SURGERY Left         Home Medications    Prior to Admission medications   Medication Sig Start Date End Date Taking? Authorizing Provider  CARTIA XT 240 MG 24 hr capsule TAKE ONE CAPSULE BY MOUTH DAILY 04/20/18   Rollene Rotunda, MD  cephALEXin (KEFLEX) 500 MG capsule Take 1 capsule (500 mg total) by mouth 4 (four) times daily. 11/23/17   Ward, Chase Picket, PA-C  diltiazem (CARTIA XT) 240 MG 24 hr capsule Take 1 capsule (240 mg total) by mouth daily. 04/20/18   Rollene Rotunda, MD  diphenhydramine-acetaminophen (TYLENOL PM) 25-500 MG TABS Take 1 tablet by mouth at bedtime as needed.    [provider]  flecainide (TAMBOCOR) 100 MG tablet Take 1 tablet (100 mg total) by mouth 2 (two) times  daily. 02/28/18   Rollene Rotunda, MD  lansoprazole (PREVACID) 30 MG capsule Take 30 mg by mouth daily.    [provider]  ramipril (ALTACE) 5 MG capsule Take 1 capsule (5 mg total) by mouth 2 (two) times daily. 01/13/18 04/13/18  Rollene Rotunda, MD    Family History Family History  Problem Relation Age of Onset  . Heart attack Father 79  . Stroke Father 102    Social History Social History   Tobacco Use  . Smoking status: Current Some Day Smoker    Packs/day: 1.00    Years: 25.00    Pack years: 25.00    Types: Cigarettes  . Smokeless tobacco: Never Used  . Tobacco comment: Off and on   Substance Use Topics  . Alcohol use: Yes  . Drug use: No     Allergies   Percocet [oxycodone-acetaminophen] and Vicodin [hydrocodone-acetaminophen]   Review of Systems Review of Systems  Constitutional: Negative for chills and fever.  Musculoskeletal: Positive for arthralgias. Negative for joint swelling.  Skin: Positive for wound.  Neurological: Negative for weakness and numbness.     Physical Exam Updated Vital Signs BP (!) 156/93 (BP Location: Right Arm)   Pulse 65   Temp (!) 97.3 F (36.3 C) (Oral)   Resp 18   Ht 5\' 10"  (1.778 m)  Wt 104.3 kg   SpO2 97%   BMI 33.00 kg/m   Physical Exam  Constitutional: He appears well-developed and well-nourished. No distress.  HENT:  Head: Normocephalic and atraumatic.  Eyes: Right eye exhibits no discharge. Left eye exhibits no discharge.  Pulmonary/Chest: Effort normal. No respiratory distress.  Musculoskeletal:  1.5 cm laceration to the ulnar aspect of the right fourth finger, it is just adjacent to the nailbed, but does involve the nail at all.  Bleeding is well controlled, laceration is fairly superficial, sensation is intact and good capillary refill, normal range of motion at the DIP and PIP joints with normal strength with flexion and extension.  Neurological: He is alert. Coordination normal.  Skin: He is not  diaphoretic.  Psychiatric: He has a normal mood and affect. His behavior is normal.  Nursing note and vitals reviewed.    ED Treatments / Results  Labs (all labs ordered are listed, but only abnormal results are displayed) Labs Reviewed - No data to display  EKG None  Radiology No results found.  Procedures .Marland Kitchen.Laceration Repair Date/Time: 09/26/2018 11:01 AM Performed by: Dartha LodgeFord, Lakeva Hollon N, PA-C Authorized by: Dartha LodgeFord, Danaysia Rader N, PA-C   Consent:    Consent obtained:  Verbal   Consent given by:  Patient   Risks discussed:  Infection, need for additional repair, poor wound healing, pain and poor cosmetic result   Alternatives discussed:  No treatment Anesthesia (see MAR for exact dosages):    Anesthesia method:  Nerve block   Block location:  Digital   Block needle gauge:  25 G   Block anesthetic:  Lidocaine 1% w/o epi   Block injection procedure:  Anatomic landmarks identified, negative aspiration for blood, introduced needle and incremental injection   Block outcome:  Anesthesia achieved Laceration details:    Location:  Finger   Finger location:  R ring finger   Length (cm):  1.5   Depth (mm):  3 Repair type:    Repair type:  Simple Pre-procedure details:    Preparation:  Patient was prepped and draped in usual sterile fashion Exploration:    Hemostasis achieved with:  Direct pressure   Wound extent: areolar tissue violated   Treatment:    Area cleansed with:  Saline   Amount of cleaning:  Standard   Irrigation solution:  Sterile saline   Irrigation volume:  40   Irrigation method:  Syringe Skin repair:    Repair method:  Sutures   Suture size:  4-0   Suture material:  Prolene   Suture technique:  Simple interrupted   Number of sutures:  2 Approximation:    Approximation:  Close Post-procedure details:    Dressing:  Antibiotic ointment and bulky dressing   Patient tolerance of procedure:  Tolerated well, no immediate complications   (including critical care  time)  Medications Ordered in ED Medications  Tdap (BOOSTRIX) injection 0.5 mL (0.5 mLs Intramuscular Given 09/26/18 1050)  lidocaine (PF) (XYLOCAINE) 1 % injection 10 mL (10 mLs Infiltration Given by Other 09/26/18 1052)     Initial Impression / Assessment and Plan / ED Course  I have reviewed the triage vital signs and the nursing notes.  Pertinent labs & imaging results that were available during my care of the patient were reviewed by me and considered in my medical decision making (see chart for details).  Patient presents with laceration to the right fourth finger that occurred just prior to arrival at work today.  No active bleeding, patient not  on any blood thinners.  Tetanus updated here in the emergency department.  The finger is neurovascularly intact there is no injury to the nailbed.  X-ray shows no evidence of foreign body or bony involvement.  Laceration closed with 2 simple interrupted sutures with good cosmesis and hemostasis.  Antibiotic ointment and dressing applied, discussed appropriate wound care, will need suture removal in about 7 days, discussed signs of infection that should warrant sooner return.  Patient expresses understanding and is in agreement with plan.  Final Clinical Impressions(s) / ED Diagnoses   Final diagnoses:  Laceration of right ring finger without foreign body without damage to nail, initial encounter    ED Discharge Orders    None       Dartha Lodge, [place] 09/26/18 1124    Tegeler, Canary Brim, MD 09/26/18 [phone]

## 2018-10-04 ENCOUNTER — Encounter (HOSPITAL_BASED_OUTPATIENT_CLINIC_OR_DEPARTMENT_OTHER): Payer: Self-pay | Admitting: Emergency Medicine

## 2018-10-04 ENCOUNTER — Other Ambulatory Visit: Payer: Self-pay

## 2018-10-04 ENCOUNTER — Emergency Department (HOSPITAL_BASED_OUTPATIENT_CLINIC_OR_DEPARTMENT_OTHER)
Admission: EM | Admit: 2018-10-04 | Discharge: 2018-10-04 | Disposition: A | Discharge status: Home / Self Care | Payer: Worker's Compensation | Attending: Emergency Medicine | Admitting: Emergency Medicine

## 2018-10-04 DIAGNOSIS — Z4802 Encounter for removal of sutures: Secondary | ICD-10-CM | POA: Diagnosis present

## 2018-10-04 DIAGNOSIS — F17210 Nicotine dependence, cigarettes, uncomplicated: Secondary | ICD-10-CM | POA: Diagnosis not present

## 2018-10-04 DIAGNOSIS — I10 Essential (primary) hypertension: Secondary | ICD-10-CM | POA: Diagnosis not present

## 2018-10-04 DIAGNOSIS — Z79899 Other long term (current) drug therapy: Secondary | ICD-10-CM | POA: Diagnosis not present

## 2018-10-04 NOTE — ED Provider Notes (Signed)
[facility] EMERGENCY DEPARTMENT Provider Note   CSN: [id number] Arrival date & time: 10/04/18  0946     History   Chief Complaint Chief Complaint  Patient presents with  . Suture / Staple Removal    HPI Scott Townsend is a 62 y.o. male.  The history is provided by the patient. No language interpreter was used.  Suture / Staple Removal  This is a new problem. The problem has been rapidly improving. Nothing aggravates the symptoms. Nothing relieves the symptoms. He has tried nothing for the symptoms.  Pt reports no problems.  Past Medical History:  Diagnosis Date  . Atrial fibrillation   . Barrett esophagus   . Hypertension     Patient Active Problem List   Diagnosis Date Noted  . Atrial fibrillation with RVR (HCC) 02/16/2012    Past Surgical History:  Procedure Laterality Date  . KNEE SURGERY Left         Home Medications    Prior to Admission medications   Medication Sig Start Date End Date Taking? Authorizing Provider  CARTIA XT 240 MG 24 hr capsule TAKE ONE CAPSULE BY MOUTH DAILY 04/20/18   Rollene RotundaHochrein, James, MD  cephALEXin (KEFLEX) 500 MG capsule Take 1 capsule (500 mg total) by mouth 4 (four) times daily. 11/23/17   Ward, Chase PicketJaime Pilcher, PA-C  diltiazem (CARTIA XT) 240 MG 24 hr capsule Take 1 capsule (240 mg total) by mouth daily. 04/20/18   Rollene RotundaHochrein, James, MD  diphenhydramine-acetaminophen (TYLENOL PM) 25-500 MG TABS Take 1 tablet by mouth at bedtime as needed.    [provider]  flecainide (TAMBOCOR) 100 MG tablet Take 1 tablet (100 mg total) by mouth 2 (two) times daily. 02/28/18   Rollene RotundaHochrein, James, MD  lansoprazole (PREVACID) 30 MG capsule Take 30 mg by mouth daily.    [provider]  ramipril (ALTACE) 5 MG capsule Take 1 capsule (5 mg total) by mouth 2 (two) times daily. 01/13/18 04/13/18  Rollene RotundaHochrein, James, MD    Family History Family History  Problem Relation Age of Onset  . Heart attack Father 7062  . Stroke Father 1762     Social History Social History   Tobacco Use  . Smoking status: Current Some Day Smoker    Packs/day: 1.00    Years: 25.00    Pack years: 25.00    Types: Cigarettes  . Smokeless tobacco: Never Used  . Tobacco comment: Off and on   Substance Use Topics  . Alcohol use: Yes  . Drug use: No     Allergies   Percocet [oxycodone-acetaminophen] and Vicodin [hydrocodone-acetaminophen]   Review of Systems Review of Systems  All other systems reviewed and are negative.    Physical Exam Updated Vital Signs BP (!) 155/79   Pulse 77   Temp 97.9 F (36.6 C) (Oral)   Resp 16   Ht 5\' 10"  (1.778 m)   Wt 104.3 kg   SpO2 96%   BMI 33.00 kg/m   Physical Exam Vitals signs and nursing note reviewed.  Skin:    General: Skin is warm.     Comments: Healed laceration finger, no sign of infection  Neurological:     General: No focal deficit present.  Psychiatric:        Mood and Affect: Mood normal.      ED Treatments / Results  Labs (all labs ordered are listed, but only abnormal results are displayed) Labs Reviewed - No data to display  EKG None  Radiology No results found.  Procedures Procedures (including critical care time)  Medications Ordered in ED Medications - No data to display   Initial Impression / Assessment and Plan / ED Course  I have reviewed the triage vital signs and the nursing notes.  Pertinent labs & imaging results that were available during my care of the patient were reviewed by me and considered in my medical decision making (see chart for details).       Final Clinical Impressions(s) / ED Diagnoses   Final diagnoses:  Visit for suture removal    ED Discharge Orders    None    An After Visit Summary was printed and given to the patient.    Elson Areas, PA-C 10/04/18 1023    Geoffery Lyons, MD 10/04/18 1451

## 2018-10-04 NOTE — ED Triage Notes (Signed)
Reports to ER for suture removal. 

## 2018-11-28 ENCOUNTER — Other Ambulatory Visit: Payer: Self-pay | Admitting: Cardiology

## 2019-01-04 ENCOUNTER — Encounter: Payer: Self-pay | Admitting: Cardiology

## 2019-01-06 ENCOUNTER — Telehealth: Payer: Self-pay | Admitting: Physician Assistant

## 2019-01-06 ENCOUNTER — Other Ambulatory Visit: Payer: Self-pay | Admitting: Physician Assistant

## 2019-01-06 MED ORDER — FLECAINIDE ACETATE 100 MG PO TABS: 100.0000 mg | ORAL_TABLET | Freq: Two times a day (BID) | ORAL | 5 refills | Status: DC

## 2019-01-06 MED ORDER — RAMIPRIL 5 MG PO CAPS: 5.0000 mg | ORAL_CAPSULE | Freq: Two times a day (BID) | ORAL | 1 refills | Status: DC

## 2019-01-06 MED ORDER — DILTIAZEM HCL ER COATED BEADS 240 MG PO CP24: 240.0000 mg | ORAL_CAPSULE | Freq: Every day | ORAL | 1 refills | Status: DC

## 2019-01-06 NOTE — Telephone Encounter (Signed)
   Primary Cardiologist:  Rollene Rotunda, MD   Patient contacted.  History reviewed.  No symptoms to suggest any unstable cardiac conditions.  Based on discussion, with current pandemic situation, we will be postponing this appointment for Mr. Landeck.  If symptoms change, he has been instructed to contact our office.   Routing to C19 CANCEL pool for tracking (P CV DIV CV19 CANCEL) and assigning priority (3 = >12 wks).  [place], [place]  01/06/2019 5:10 PM         .

## 2019-01-13 ENCOUNTER — Ambulatory Visit: Payer: Self-pay | Admitting: Cardiology

## 2019-02-02 ENCOUNTER — Telehealth: Payer: Self-pay | Admitting: Cardiology

## 2019-02-02 NOTE — Telephone Encounter (Signed)
lmsg for pt to call back to r/s 01/13/19 appt.  Schedule evisit next week or face to face in August.

## 2019-02-13 ENCOUNTER — Telehealth: Payer: Self-pay | Admitting: Cardiology

## 2019-02-13 NOTE — Telephone Encounter (Signed)
Called to pre reg patient for 02/14/19 appt. He informed me that he has returned to work and needs to reschedule appointment time if possible

## 2019-02-14 ENCOUNTER — Telehealth: Payer: Self-pay | Admitting: Cardiology

## 2019-02-16 NOTE — Progress Notes (Signed)
Virtual Visit via Video Note   This visit type was conducted due to national recommendations for restrictions regarding the COVID-19 Pandemic (e.g. social distancing) in an effort to limit this patient's exposure and mitigate transmission in our community.  Due to his co-morbid illnesses, this patient is at least at moderate risk for complications without adequate follow up.  This format is felt to be most appropriate for this patient at this time.  All issues noted in this document were discussed and addressed.  A limited physical exam was performed with this format.  Please refer to the patient's chart for his consent to telehealth for [facility].   Date:  02/17/2019   ID:  Scott Townsend, DOB 13-Apr-1956, MRN [id number]  Patient Location: Home Provider Location: Home  PCP:  Patient, No Pcp Per  Cardiologist:  Rollene Rotunda, MD  Electrophysiologist:  None   Evaluation Performed:  Follow-Up Visit  Chief Complaint:  Atrial fib   History of Present Illness:    Scott Townsend is a 63 y.o. male for evaluation of atrial fibrillation. His fibrillation started at 2013. He's been cardioverted 3 times.   First on Cardizem 180 mg, second on Cardizem 240 mg and the third time he was started on flecainide.  Since I last saw him he has done very well.  The patient denies any new symptoms such as chest discomfort, neck or arm discomfort. There has been no new shortness of breath, PND or orthopnea. There have been no reported palpitations, presyncope or syncope.  He says he would know if he was in atrial fib.   The patient does not have symptoms concerning for COVID-19 infection (fever, chills, cough, or new shortness of breath).    Past Medical History:  Diagnosis Date  . Atrial fibrillation   . Barrett esophagus   . Hypertension    Past Surgical History:  Procedure Laterality Date  . KNEE SURGERY Left      Current Meds  Medication Sig  . diltiazem (CARTIA XT) 240 MG 24 hr  capsule Take 1 capsule (240 mg total) by mouth daily.  . diphenhydramine-acetaminophen (TYLENOL PM) 25-500 MG TABS Take 1 tablet by mouth at bedtime as needed.  . flecainide (TAMBOCOR) 100 MG tablet Take 1 tablet (100 mg total) by mouth 2 (two) times daily.  . lansoprazole (PREVACID) 30 MG capsule Take 30 mg by mouth daily.  . ramipril (ALTACE) 5 MG capsule Take 1 capsule (5 mg total) by mouth 2 (two) times daily.     Allergies:   Percocet [oxycodone-acetaminophen] and Vicodin [hydrocodone-acetaminophen]   Social History   Tobacco Use  . Smoking status: Current Some Day Smoker    Packs/day: 1.00    Years: 25.00    Pack years: 25.00    Types: Cigarettes  . Smokeless tobacco: Never Used  . Tobacco comment: Off and on   Substance Use Topics  . Alcohol use: Yes  . Drug use: No     Family Hx: The patient's family history includes Heart attack (age of onset: 27) in his father; Stroke (age of onset: 82) in his father.  ROS:   Please see the history of present illness.    As stated in the HPI and negative for all other systems.   Prior CV studies:   The following studies were reviewed today: None  Labs/Other Tests and Data Reviewed:    EKG:  No ECG reviewed.  Recent Labs: No results found for requested labs within  last 8760 hours.   Recent Lipid Panel Lab Results  Component Value Date/Time   CHOL 160 01/04/2017 01:04 PM   TRIG 86 01/04/2017 01:04 PM   HDL 61 01/04/2017 01:04 PM   CHOLHDL 2.6 01/04/2017 01:04 PM   LDLCALC 82 01/04/2017 01:04 PM    Wt Readings from Last 3 Encounters:  02/17/19 230 lb (104.3 kg)  10/04/18 230 lb (104.3 kg)  09/26/18 230 lb (104.3 kg)     Objective:    Vital Signs:  Ht 5\' 10"  (1.778 m)   Wt 230 lb (104.3 kg)   BMI 33.00 kg/m    VITAL SIGNS:  reviewed GEN:  no acute distress RESPIRATORY:  normal respiratory effort, symmetric expansion NEURO:  alert and oriented x 3, no obvious focal deficit PSYCH:  normal affect  ASSESSMENT  & PLAN:    ATRIAL FIB:    He does think that he has any further paroxysms.  No change in therapy.    HTN:    I asked him to get a BP cuff.  He does not know his recent readings and I asked him to keep a BP diary.   COVID-19 Education: The signs and symptoms of COVID-19 were discussed with the patient and how to seek care for testing (follow up with PCP or arrange E-visit).  The importance of social distancing was discussed today.  Time:   Today, I have spent 16 minutes with the patient with telehealth technology discussing the above problems.     Medication Adjustments/Labs and Tests Ordered: Current medicines are reviewed at length with the patient today.  Concerns regarding medicines are outlined above.   Tests Ordered: No orders of the defined types were placed in this encounter.   Medication Changes: No orders of the defined types were placed in this encounter.   Disposition:  Follow up me in one year.   Signed, Rollene RotundaJames Javaun Dimperio, MD  02/17/2019 3:22 PM    [facility]

## 2019-02-16 NOTE — Telephone Encounter (Signed)
Please reschedule as he is able.

## 2019-02-17 ENCOUNTER — Encounter: Payer: Self-pay | Admitting: Cardiology

## 2019-02-17 ENCOUNTER — Telehealth (INDEPENDENT_AMBULATORY_CARE_PROVIDER_SITE_OTHER): Payer: Self-pay | Admitting: Cardiology

## 2019-02-17 ENCOUNTER — Other Ambulatory Visit: Payer: Self-pay

## 2019-02-17 VITALS — Ht 70.0 in | Wt 230.0 lb

## 2019-02-17 DIAGNOSIS — Z79899 Other long term (current) drug therapy: Secondary | ICD-10-CM

## 2019-02-17 DIAGNOSIS — I480 Paroxysmal atrial fibrillation: Secondary | ICD-10-CM

## 2019-02-17 MED ORDER — DILTIAZEM HCL ER COATED BEADS 240 MG PO CP24: 240.0000 mg | ORAL_CAPSULE | Freq: Every day | ORAL | 3 refills | Status: DC

## 2019-02-17 MED ORDER — RAMIPRIL 5 MG PO CAPS: 5.0000 mg | ORAL_CAPSULE | Freq: Two times a day (BID) | ORAL | 3 refills | Status: DC

## 2019-02-17 MED ORDER — FLECAINIDE ACETATE 100 MG PO TABS: 100.0000 mg | ORAL_TABLET | Freq: Two times a day (BID) | ORAL | 5 refills | Status: DC

## 2019-02-17 NOTE — Patient Instructions (Addendum)
Medication Instructions:  Continue current medications  If you need a refill on your cardiac medications before your next appointment, please call your pharmacy.  Labwork: CBC, BMP and Fasting Lipids HERE IN OUR OFFICE AT [facility]  You will need to fast. DO NOT EAT OR DRINK PAST MIDNIGHT.       Take the provided lab slips with you to the lab for your blood draw.   When you have your labs (blood work) drawn today and your tests are completely normal, you will receive your results only by MyChart Message (if you have MyChart) -OR-  A paper copy in the mail.  If you have any lab test that is abnormal or we need to change your treatment, we will call you to review these results.  Testing/Procedures: None Ordered  Follow-Up: You will need a follow up appointment in 1 Year.  Please call our office 2 months in advance to schedule this appointment.  You may see Rollene Rotunda, MD or one of the following Advanced Practice Providers on your designated Care Team:   Theodore Demark, PA-C . Joni Reining, DNP, ANP      At [facility], you and your health needs are our priority.  As part of our continuing mission to provide you with exceptional heart care, we have created designated Provider Care Teams.  These Care Teams include your primary Cardiologist (physician) and Advanced Practice Providers (APPs -  Physician Assistants and Nurse Practitioners) who all work together to provide you with the care you need, when you need it.  Thank you for choosing [facility] at [facility]!!

## 2019-06-02 ENCOUNTER — Ambulatory Visit: Payer: Self-pay | Admitting: Cardiology

## 2019-08-29 ENCOUNTER — Other Ambulatory Visit: Payer: Self-pay

## 2019-08-29 DIAGNOSIS — Z20822 Contact with and (suspected) exposure to covid-19: Secondary | ICD-10-CM

## 2019-09-01 LAB — NOVEL CORONAVIRUS, NAA: SARS-CoV-2, NAA: NOT DETECTED

## 2019-12-22 ENCOUNTER — Encounter (HOSPITAL_COMMUNITY): Payer: Self-pay | Admitting: Emergency Medicine

## 2019-12-22 ENCOUNTER — Other Ambulatory Visit: Payer: Self-pay

## 2019-12-22 ENCOUNTER — Telehealth (HOSPITAL_COMMUNITY): Payer: Self-pay

## 2019-12-22 ENCOUNTER — Emergency Department (HOSPITAL_COMMUNITY): Payer: Self-pay

## 2019-12-22 ENCOUNTER — Emergency Department (HOSPITAL_COMMUNITY)
Admission: EM | Admit: 2019-12-22 | Discharge: 2019-12-22 | Disposition: A | Discharge status: Home / Self Care | Payer: Self-pay | Attending: Emergency Medicine | Admitting: Emergency Medicine

## 2019-12-22 ENCOUNTER — Telehealth (HOSPITAL_COMMUNITY): Payer: Self-pay | Admitting: Physician Assistant

## 2019-12-22 DIAGNOSIS — I480 Paroxysmal atrial fibrillation: Secondary | ICD-10-CM | POA: Insufficient documentation

## 2019-12-22 DIAGNOSIS — I10 Essential (primary) hypertension: Secondary | ICD-10-CM | POA: Insufficient documentation

## 2019-12-22 DIAGNOSIS — Z79899 Other long term (current) drug therapy: Secondary | ICD-10-CM | POA: Insufficient documentation

## 2019-12-22 DIAGNOSIS — F17210 Nicotine dependence, cigarettes, uncomplicated: Secondary | ICD-10-CM | POA: Insufficient documentation

## 2019-12-22 HISTORY — DX: Unspecified atrial fibrillation: I48.91

## 2019-12-22 LAB — BASIC METABOLIC PANEL
Anion gap: 12 (ref 5–15)
BUN: 13 mg/dL (ref 8–23)
CO2: 22 mmol/L (ref 22–32)
Calcium: 9.5 mg/dL (ref 8.9–10.3)
Chloride: 106 mmol/L (ref 98–111)
Creatinine, Ser: 1.01 mg/dL (ref 0.61–1.24)
GFR calc Af Amer: >60 mL/min (ref >60)
GFR calc non Af Amer: >60 mL/min (ref >60)
Glucose, Bld: 114 mg/dL — ABNORMAL HIGH (ref 70–99)
Potassium: 3.5 mmol/L (ref 3.5–5.1)
Sodium: 140 mmol/L (ref 135–145)

## 2019-12-22 LAB — CBC
HCT: 49.1 % (ref 39.0–52.0)
Hemoglobin: 16.5 g/dL (ref 13.0–17.0)
MCH: 30.9 pg (ref 26.0–34.0)
MCHC: 33.6 g/dL (ref 30.0–36.0)
MCV: 91.9 fL (ref 80.0–100.0)
Platelets: 214 10*3/uL (ref 150–400)
RBC: 5.34 MIL/uL (ref 4.22–5.81)
RDW: 12.9 % (ref 11.5–15.5)
WBC: 9 10*3/uL (ref 4.0–10.5)
nRBC: 0 % (ref 0.0–0.2)

## 2019-12-22 LAB — TROPONIN I (HIGH SENSITIVITY)
Troponin I (High Sensitivity): 4 ng/L (ref <18)
Troponin I (High Sensitivity): 6 ng/L (ref <18)

## 2019-12-22 MED ORDER — SODIUM CHLORIDE 0.9% FLUSH: 3.0000 mL | Freq: Once | INTRAVENOUS | Status: DC

## 2019-12-22 NOTE — ED Provider Notes (Signed)
[facility] EMERGENCY DEPARTMENT Provider Note   CSN: [id number] Arrival date & time: 12/22/19  0021     History Chief Complaint  Patient presents with  . Irregular Heart Beat    Scott Townsend is a 64 y.o. male.  HPI Patient presents with feeling his heart race.  Began last night.  History of paroxysmal A. fib.  Not on anticoagulation.  Has seen Dr. Antoine Poche.  Is on Cardizem and flecainide.  Does have history of hypertension.  No chest pain but did feel little tightness with it.  Has been doing well.  States really has not had this much A. fib in the last 5 years.  No swelling his legs.  No fevers.  No cough.  States he had a negative Covid test recently.  He had no symptoms but states someone at work had tested positive although he had not seen that person in a week.    Past Medical History:  Diagnosis Date  . Atrial fibrillation   . Atrial fibrillation (HCC)   . Barrett esophagus   . Hypertension     Patient Active Problem List   Diagnosis Date Noted  . Atrial fibrillation with RVR (HCC) 02/16/2012    Past Surgical History:  Procedure Laterality Date  . KNEE SURGERY Left        Family History  Problem Relation Age of Onset  . Heart attack Father 63  . Stroke Father 70    Social History   Tobacco Use  . Smoking status: Current Some Day Smoker    Packs/day: 1.00    Years: 25.00    Pack years: 25.00    Types: Cigarettes  . Smokeless tobacco: Never Used  . Tobacco comment: Off and on   Substance Use Topics  . Alcohol use: Yes  . Drug use: No    Home Medications Prior to Admission medications   Medication Sig Start Date End Date Taking? Authorizing Provider  diltiazem (CARTIA XT) 240 MG 24 hr capsule Take 1 capsule (240 mg total) by mouth daily. 02/17/19   Rollene Rotunda, MD  diphenhydramine-acetaminophen (TYLENOL PM) 25-500 MG TABS Take 1 tablet by mouth at bedtime as needed.    [provider]  flecainide (TAMBOCOR) 100  MG tablet Take 1 tablet (100 mg total) by mouth 2 (two) times daily. 02/17/19   Rollene Rotunda, MD  lansoprazole (PREVACID) 30 MG capsule Take 30 mg by mouth daily.    [provider]  ramipril (ALTACE) 5 MG capsule Take 1 capsule (5 mg total) by mouth 2 (two) times daily. 02/17/19 08/16/19  Rollene Rotunda, MD    Allergies    Percocet [oxycodone-acetaminophen] and Vicodin [hydrocodone-acetaminophen]  Review of Systems   Review of Systems  Constitutional: Negative for appetite change.  HENT: Negative for congestion.   Respiratory: Negative for shortness of breath.   Cardiovascular: Positive for palpitations.  Gastrointestinal: Negative for abdominal pain.  Genitourinary: Negative for flank pain.  Musculoskeletal: Negative for back pain.  Neurological: Negative for weakness.  Psychiatric/Behavioral: Negative for confusion.    Physical Exam Updated Vital Signs BP (!) 151/91   Pulse 85   Temp 97.7 F (36.5 C) (Oral)   Resp 13   Ht 5\' 10"  (1.778 m)   Wt 104.3 kg   SpO2 97%   BMI 33.00 kg/m   Physical Exam Vitals and nursing note reviewed.  HENT:     Head: Normocephalic.  Eyes:     General: No scleral icterus. Cardiovascular:  Rate and Rhythm: Normal rate and regular rhythm.  Pulmonary:     Breath sounds: No wheezing or rhonchi.  Abdominal:     Tenderness: There is no abdominal tenderness.  Musculoskeletal:     Cervical back: Neck supple.     Right lower leg: No edema.     Left lower leg: No edema.  Skin:    General: Skin is warm.     Capillary Refill: Capillary refill takes less than 2 seconds.  Neurological:     Mental Status: He is alert and oriented to person, place, and time.     ED Results / Procedures / Treatments   Labs (all labs ordered are listed, but only abnormal results are displayed) Labs Reviewed  BASIC METABOLIC PANEL - Abnormal; Notable for the following components:      Result Value   Glucose, Bld 114 (*)    All other components  within normal limits  CBC  TROPONIN I (HIGH SENSITIVITY)  TROPONIN I (HIGH SENSITIVITY)    EKG EKG Interpretation  Date/Time:  Friday December 22 2019 00:27:29 EST Ventricular Rate:  125 PR Interval:    QRS Duration: 94 QT Interval:  334 QTC Calculation: 482 R Axis:   49 Text Interpretation: Atrial fibrillation with rapid ventricular response ST depression, consider subendocardial injury Abnormal ECG Confirmed by Davonna Belling [phone]) on 12/22/2019 7:24:09 AM   Radiology DG Chest 2 View  Result Date: 12/22/2019 CLINICAL DATA:  Irregular heartbeat and atrial fibrillation EXAM: CHEST - 2 VIEW COMPARISON:  03/25/2013 FINDINGS: The heart size and mediastinal contours are within normal limits. Both lungs are clear. The visualized skeletal structures are unremarkable. IMPRESSION: No active cardiopulmonary disease. Electronically Signed   By: Inez Catalina M.D.   On: 12/22/2019 00:50    Procedures Procedures (including critical care time)  Medications Ordered in ED Medications  sodium chloride flush (NS) 0.9 % injection 3 mL (0 mLs Intravenous Hold 12/22/19 0739)    ED Course  I have reviewed the triage vital signs and the nursing notes.  Pertinent labs & imaging results that were available during my care of the patient were reviewed by me and considered in my medical decision making (see chart for details).    MDM Rules/Calculators/A&P                      Patient presented with paroxysmal A. fib.  EMS EKGs and first EKG here shows A. fib with RVR.  Does have history of same.  Not on anticoagulation.  Reviewed cardiology notes had a chadsvasc score of 1.  Cardiology did not think he needed anticoagulation at that time. Unfortunately patient had around a 7-hour wait before he got back to see me, although fortunately he had converted himself it appears to be likely shortly after arrival.  We will continue his home medicines.  Will follow up with his primary cardiologist in the A. fib  clinic.  Will stay with cardiology recommendations at this time and not start anticoagulation through the ER but they can discuss this with him.  Overall he is a low risk of thromboembolic event in the time for follow-up.  Will discharge home. Final Clinical Impression(s) / ED Diagnoses Final diagnoses:  Paroxysmal atrial fibrillation with rapid ventricular response [facility])    Rx / DC Orders ED Discharge Orders    None       Davonna Belling, MD 12/22/19 [phone]

## 2019-12-22 NOTE — Telephone Encounter (Signed)
Patient on ED report. Reached out to schedule ED follow-up appointment no answer left voicemail.

## 2019-12-22 NOTE — ED Triage Notes (Addendum)
Pt to ED via [facility] with c/o irreg. Heart beat.  Pt st's he was at rest tonight when he felt his heart start racing.  Pt has hx of A-fib but is normally controlled.  Pt denies any chest pain

## 2019-12-22 NOTE — Telephone Encounter (Signed)
Called and left VM for patient to call back to schedule  f/u from ED visit 12/22/19.

## 2019-12-29 NOTE — Telephone Encounter (Signed)
Called and left 2nd VM on 12/25/19 and still no response from pt.

## 2019-12-29 NOTE — Telephone Encounter (Signed)
Letter mailed to pt asking that he contact [facility] to schedule ED f/u appt.

## 2020-01-15 ENCOUNTER — Other Ambulatory Visit: Payer: Self-pay | Admitting: Physician Assistant

## 2020-01-18 MED ORDER — FLECAINIDE ACETATE 100 MG PO TABS: 100.0000 mg | ORAL_TABLET | Freq: Two times a day (BID) | ORAL | 5 refills | Status: DC

## 2020-01-25 DIAGNOSIS — I10 Essential (primary) hypertension: Secondary | ICD-10-CM | POA: Insufficient documentation

## 2020-01-25 DIAGNOSIS — Z7189 Other specified counseling: Secondary | ICD-10-CM | POA: Insufficient documentation

## 2020-01-25 DIAGNOSIS — I480 Paroxysmal atrial fibrillation: Secondary | ICD-10-CM | POA: Insufficient documentation

## 2020-01-25 NOTE — Progress Notes (Signed)
Cardiology Office Note   Date:  01/26/2020   ID:  Scott Townsend, DOB 1956-10-05, MRN [id number]  PCP:  Patient, No Pcp Per  Cardiologist:   Minus Breeding, MD   Chief Complaint  Patient presents with  . Palpitations      History of Present Illness: Scott Townsend is a 64 y.o. male who presentS for evaluation of atrial fibrillation. His fibrillation started at 2013. He's been cardioverted 3 times.   First on Cardizem 180 mg, second on Cardizem 240 mg and the third time he was started on flecainide.  Since I last saw him he was in the ED with atrial fib but converted prior to seeing the ED MD.     He says he has been getting he thinks very short paroxysms may be more frequently.  He knew that night that he was starting to go into it.  Because he felt it being out of rhythm and he could tell on his watch that his heart rate was irregular.  He went into it persistently at about 1030 that night and was in it for several hours before finally converting spontaneously.  I did review the ER records and he was not given any therapy.  I saw an EKG and his heart rate was in the 120s and he said it was in the 130s at times.  He felt the palpitations.  He feels somewhat weak with it.  He is not describing chest discomfort, neck or arm discomfort.  He is not having any new shortness of breath, PND or orthopnea.  His blood pressure has been elevated at home.  Past Medical History:  Diagnosis Date  . Atrial fibrillation   . Atrial fibrillation ([facility])   . Barrett esophagus   . Hypertension     Past Surgical History:  Procedure Laterality Date  . KNEE SURGERY Left      Current Outpatient Medications  Medication Sig Dispense Refill  . diltiazem (CARTIA XT) 240 MG 24 hr capsule Take 1 capsule (240 mg total) by mouth daily. 90 capsule 3  . diphenhydramine-acetaminophen (TYLENOL PM) 25-500 MG TABS Take 1 tablet by mouth at bedtime as needed.    . flecainide (TAMBOCOR) 100 MG tablet Take  1 tablet (100 mg total) by mouth 2 (two) times daily. 60 tablet 5  . lansoprazole (PREVACID) 30 MG capsule Take 30 mg by mouth daily.    . Loratadine-Pseudoephedrine (CLARITIN-D 24 HOUR PO)     . ramipril (ALTACE) 10 MG capsule Take 1 capsule (10 mg total) by mouth 2 (two) times daily. 180 capsule 3  . rivaroxaban (XARELTO) 20 MG TABS tablet Take 1 tablet (20 mg total) by mouth daily with supper. 30 tablet 11   No current facility-administered medications for this visit.    Allergies:   Percocet [oxycodone-acetaminophen] and Vicodin [hydrocodone-acetaminophen]    ROS:  Please see the history of present illness.   Otherwise, review of systems are positive for none.   All other systems are reviewed and negative.    PHYSICAL EXAM: VS:  BP (!) 160/90   Pulse 61   Temp (!) 97.5 F (36.4 C)   Ht 5\' 10"  (1.778 m)   Wt 238 lb 9.6 oz (108.2 kg)   SpO2 98%   BMI 34.24 kg/m  , BMI Body mass index is 34.24 kg/m. GENERAL:  Well appearing NECK:  No jugular venous distention, waveform within normal limits, carotid upstroke brisk and symmetric, no bruits, no  thyromegaly LUNGS:  Clear to auscultation bilaterally CHEST:  Unremarkable HEART:  PMI not displaced or sustained,S1 and S2 within normal limits, no S3, no S4, no clicks, no rubs, no murmurs ABD:  Flat, positive bowel sounds normal in frequency in pitch, no bruits, no rebound, no guarding, no midline pulsatile mass, no hepatomegaly, no splenomegaly EXT:  2 plus pulses throughout, no edema, no cyanosis no clubbing    EKG:  EKG is ordered today. The ekg ordered today demonstrates sinus rhythm, rate 61, axis within normal limits, intervals within normal limits, no acute ST-T wave changes.   Recent Labs: 12/22/2019: BUN 13; Creatinine, Ser 1.01; Hemoglobin 16.5; Platelets 214; Potassium 3.5; Sodium 140    Lipid Panel    Component Value Date/Time   CHOL 160 01/04/2017 1304   TRIG 86 01/04/2017 1304   HDL 61 01/04/2017 1304   CHOLHDL  2.6 01/04/2017 1304   VLDL 17 01/04/2017 1304   LDLCALC 82 01/04/2017 1304      Wt Readings from Last 3 Encounters:  01/26/20 238 lb 9.6 oz (108.2 kg)  12/22/19 230 lb (104.3 kg)  02/17/19 230 lb (104.3 kg)      Other studies Reviewed: Additional studies/ records that were reviewed today include: ED records. Review of the above records demonstrates:  Please see elsewhere in the note.     ASSESSMENT AND PLAN:  ATRIAL FIB: He is having some small breakthroughs and one sustained episode that was self-limited.  I am going to check a flecainide level and if he is low I will increase the dose.  Otherwise he does not think at this point that he would classify this as flecainide failure as it is still relatively infrequent.  I did suggest that we could switch him to Tikosyn if he is having increasing symptoms.  And he will let me know.  Technically Scott Townsend has a CHA2DS2 - VASc score of 1.  However he is closing in on 65 and about to get another point.  We talked about this and the risk of stroke and the risk benefit of anticoagulation.  We had patient activation and involvement in his biggest fear would be throwing an embolic stroke so he would choose to take anticoagulation at this point so I will start him on Xarelto.  He has no contraindication.  I will check a TSH.  HTN:    His blood pressure is not at target and I am going to increase his Altace to 10 mg twice daily.  COVID EDUCATION: He has had his vaccine.   Current medicines are reviewed at length with the patient today.  The patient does not have concerns regarding medicines.  The following changes have been made:  no change  Labs/ tests ordered today include:   Orders Placed This Encounter  Procedures  . Flecainide level  . TSH  . EKG 12-Lead     Disposition:   FU with me or APP in two months.     Signed, Rollene Rotunda, MD  01/26/2020 10:08 AM    [facility]

## 2020-01-26 ENCOUNTER — Other Ambulatory Visit: Payer: Self-pay

## 2020-01-26 ENCOUNTER — Encounter: Payer: Self-pay | Admitting: Cardiology

## 2020-01-26 ENCOUNTER — Ambulatory Visit (INDEPENDENT_AMBULATORY_CARE_PROVIDER_SITE_OTHER): Payer: Self-pay | Admitting: Cardiology

## 2020-01-26 VITALS — BP 160/90 | HR 61 | Temp 97.5°F | Ht 70.0 in | Wt 238.6 lb

## 2020-01-26 DIAGNOSIS — I10 Essential (primary) hypertension: Secondary | ICD-10-CM

## 2020-01-26 DIAGNOSIS — I480 Paroxysmal atrial fibrillation: Secondary | ICD-10-CM

## 2020-01-26 DIAGNOSIS — Z7189 Other specified counseling: Secondary | ICD-10-CM

## 2020-01-26 MED ORDER — RIVAROXABAN 20 MG PO TABS: 20.0000 mg | ORAL_TABLET | Freq: Every day | ORAL | 11 refills | Status: DC

## 2020-01-26 MED ORDER — RAMIPRIL 10 MG PO CAPS: 10.0000 mg | ORAL_CAPSULE | Freq: Two times a day (BID) | ORAL | 3 refills | Status: DC

## 2020-01-26 NOTE — Patient Instructions (Addendum)
Medication Instructions:  INCREASE ALTACE TO 10MG  TWICE A DAY START XARELTO 20MG  DAILY *If you need a refill on your cardiac medications before your next appointment, please call your pharmacy*  Lab Work: Your physician recommends that you return for lab work this afternoon (Flecainide level and TSH) If you have labs (blood work) drawn today and your tests are completely normal, you will receive your results only by: MyChart Message (if you have MyChart) OR . A paper copy in the mail If you have any lab test that is abnormal or we need to change your treatment, we will call you to review the results.  Testing/Procedures: None ordered this visit  Follow-Up: At [facility], you and your health needs are our priority.  As part of our continuing mission to provide you with exceptional heart care, we have created designated Provider Care Teams.  These Care Teams include your primary Cardiologist (physician) and Advanced Practice Providers (APPs -  Physician Assistants and Nurse Practitioners) who all work together to provide you with the care you need, when you need it.  Your next appointment:   2 month(s)  The format for your next appointment:   In Person  Provider:   Marland Kitchen, PA-C

## 2020-01-29 ENCOUNTER — Ambulatory Visit: Payer: Self-pay

## 2020-01-30 LAB — TSH: TSH: 1.06 u[IU]/mL (ref 0.450–4.500)

## 2020-01-30 LAB — FLECAINIDE LEVEL: Flecainide: 0.31 ug/mL (ref 0.20–1.00)

## 2020-02-02 NOTE — Telephone Encounter (Signed)
I spoke with patient who reports he had a missed call from office yesterday. No message. I reviewed recent lab results with patient and confirmed he is aware of appointment on 6/10.  I cannot find where a call was placed to patient. Patient is unable to afford Xarelto. Does have 3 weeks left from first month free. He has completed and turned in paperwork for assistance program.  Will forward to Dr Hochrein's nurse for follow up on this

## 2020-02-26 ENCOUNTER — Ambulatory Visit: Payer: Self-pay | Admitting: Physician Assistant

## 2020-02-27 DIAGNOSIS — Z79899 Other long term (current) drug therapy: Secondary | ICD-10-CM

## 2020-02-28 NOTE — Telephone Encounter (Signed)
Spoke with patient. Hypertension occurred yesterday. Patient did not check blood pressure today but will start checking it daily tomorrow and report back with readings for review. Patient is asymptomatic at this time states he has blurred vision on occasion but saw it is a side effect of one of his medications. Reports his blood pressure usually runs 160s/90s. Will await readings.

## 2020-03-05 MED ORDER — CHLORTHALIDONE 25 MG PO TABS: 25.0000 mg | ORAL_TABLET | Freq: Every day | ORAL | 3 refills | Status: DC

## 2020-03-05 NOTE — Telephone Encounter (Signed)
Per Dr. Antoine Poche: BP is not at target. I would add Chlorthalidone 25 mg daily. However, last potassium was borderline low. Ask him to increase his potassium containing foods and we can repeat a BMET about 1 week after starting the new med.

## 2020-03-05 NOTE — Addendum Note (Signed)
Addended by: Teressa Senter on: 03/05/2020 12:49 PM   Modules accepted: Orders

## 2020-03-08 ENCOUNTER — Ambulatory Visit (INDEPENDENT_AMBULATORY_CARE_PROVIDER_SITE_OTHER): Payer: Self-pay | Admitting: Pharmacist

## 2020-03-08 ENCOUNTER — Other Ambulatory Visit: Payer: Self-pay

## 2020-03-08 DIAGNOSIS — Z7901 Long term (current) use of anticoagulants: Secondary | ICD-10-CM | POA: Insufficient documentation

## 2020-03-08 DIAGNOSIS — I4891 Unspecified atrial fibrillation: Secondary | ICD-10-CM

## 2020-03-08 DIAGNOSIS — I480 Paroxysmal atrial fibrillation: Secondary | ICD-10-CM

## 2020-03-08 MED ORDER — WARFARIN SODIUM 5 MG PO TABS: 5.0000 mg | ORAL_TABLET | Freq: Every evening | ORAL | 0 refills | Status: DC

## 2020-03-08 NOTE — Patient Instructions (Addendum)
Take Xarelto and warfarin together in the evening tonight, Saturday night, and Sunday night.  Then take only warfarin on Monday night.  Come to the warfarin clinic on Tuesday the 25th for INR check

## 2020-03-12 ENCOUNTER — Other Ambulatory Visit: Payer: Self-pay

## 2020-03-12 ENCOUNTER — Ambulatory Visit (INDEPENDENT_AMBULATORY_CARE_PROVIDER_SITE_OTHER): Payer: Self-pay | Admitting: Pharmacist

## 2020-03-12 DIAGNOSIS — I480 Paroxysmal atrial fibrillation: Secondary | ICD-10-CM

## 2020-03-12 DIAGNOSIS — I4891 Unspecified atrial fibrillation: Secondary | ICD-10-CM

## 2020-03-12 DIAGNOSIS — Z7901 Long term (current) use of anticoagulants: Secondary | ICD-10-CM

## 2020-03-12 LAB — POCT INR: INR: 2.1 (ref 2.0–3.0)

## 2020-03-13 LAB — BASIC METABOLIC PANEL
BUN/Creatinine Ratio: 16 (ref 10–24)
BUN: 20 mg/dL (ref 8–27)
CO2: 18 mmol/L — ABNORMAL LOW (ref 20–29)
Calcium: 9.2 mg/dL (ref 8.6–10.2)
Chloride: 87 mmol/L — ABNORMAL LOW (ref 96–106)
Creatinine, Ser: 1.24 mg/dL (ref 0.76–1.27)
GFR calc Af Amer: 71 mL/min/{1.73_m2} (ref >59)
GFR calc non Af Amer: 61 mL/min/{1.73_m2} (ref >59)
Glucose: 116 mg/dL — ABNORMAL HIGH (ref 65–99)
Potassium: 4 mmol/L (ref 3.5–5.2)
Sodium: 125 mmol/L — ABNORMAL LOW (ref 134–144)

## 2020-03-14 ENCOUNTER — Other Ambulatory Visit: Payer: Self-pay

## 2020-03-14 DIAGNOSIS — E871 Hypo-osmolality and hyponatremia: Secondary | ICD-10-CM

## 2020-03-14 DIAGNOSIS — Z79899 Other long term (current) drug therapy: Secondary | ICD-10-CM

## 2020-03-14 NOTE — Progress Notes (Signed)
Repeat BMP on Friday per Dr. Antoine Poche. Order placed.

## 2020-03-15 ENCOUNTER — Other Ambulatory Visit: Payer: Self-pay

## 2020-03-15 DIAGNOSIS — E871 Hypo-osmolality and hyponatremia: Secondary | ICD-10-CM

## 2020-03-15 DIAGNOSIS — Z79899 Other long term (current) drug therapy: Secondary | ICD-10-CM

## 2020-03-16 LAB — BASIC METABOLIC PANEL
BUN/Creatinine Ratio: 13 (ref 10–24)
BUN: 16 mg/dL (ref 8–27)
CO2: 21 mmol/L (ref 20–29)
Calcium: 8.9 mg/dL (ref 8.6–10.2)
Chloride: 89 mmol/L — ABNORMAL LOW (ref 96–106)
Creatinine, Ser: 1.19 mg/dL (ref 0.76–1.27)
GFR calc Af Amer: 75 mL/min/{1.73_m2} (ref >59)
GFR calc non Af Amer: 65 mL/min/{1.73_m2} (ref >59)
Glucose: 112 mg/dL — ABNORMAL HIGH (ref 65–99)
Potassium: 4.1 mmol/L (ref 3.5–5.2)
Sodium: 126 mmol/L — ABNORMAL LOW (ref 134–144)

## 2020-03-19 ENCOUNTER — Ambulatory Visit (INDEPENDENT_AMBULATORY_CARE_PROVIDER_SITE_OTHER): Payer: Self-pay | Admitting: Pharmacist Clinician (PhC)/ Clinical Pharmacy Specialist

## 2020-03-19 ENCOUNTER — Other Ambulatory Visit: Payer: Self-pay

## 2020-03-19 DIAGNOSIS — I4891 Unspecified atrial fibrillation: Secondary | ICD-10-CM

## 2020-03-19 DIAGNOSIS — Z7901 Long term (current) use of anticoagulants: Secondary | ICD-10-CM

## 2020-03-19 DIAGNOSIS — E871 Hypo-osmolality and hyponatremia: Secondary | ICD-10-CM

## 2020-03-19 DIAGNOSIS — I480 Paroxysmal atrial fibrillation: Secondary | ICD-10-CM

## 2020-03-19 LAB — POCT INR: INR: 2.2 (ref 2.0–3.0)

## 2020-03-19 NOTE — Progress Notes (Signed)
Rollene Rotunda, MD  Teressa Senter, RN  His Na is still low. I don't suspect clinically hypervolemia. I don't recall any findings of hypovolemia. TSH was normal. Please make sure he is not drinking excessive water. I would like to check urinalysis for electrolytes to include Na and chloride levels and check urine and serum osmolality and an AM cortisol level. Please order and call. He needs to restrict his water intake in the meantime.

## 2020-03-25 ENCOUNTER — Other Ambulatory Visit: Payer: Self-pay

## 2020-03-25 DIAGNOSIS — E871 Hypo-osmolality and hyponatremia: Secondary | ICD-10-CM

## 2020-03-25 NOTE — Progress Notes (Signed)
Per Dr. Antoine Poche: "I looked back at the timing of the med change and labs and it most likely was the chlorthalidone. Go ahead and skip the urine studies and cortisol and repeat a BMET on Monday."

## 2020-03-25 NOTE — Addendum Note (Signed)
Addended by: Teressa Senter on: 03/25/2020 01:18 PM   Modules accepted: Orders

## 2020-03-26 LAB — OSMOLALITY, URINE: Osmolality, Ur: 163 mOsmol/kg

## 2020-03-26 LAB — OSMOLALITY: Osmolality Meas: 289 mOsmol/kg (ref 280–301)

## 2020-03-26 LAB — CORTISOL-AM, BLOOD: Cortisol - AM: 11.4 ug/dL (ref 6.2–19.4)

## 2020-03-27 LAB — BASIC METABOLIC PANEL
BUN/Creatinine Ratio: 11 (ref 10–24)
BUN: 11 mg/dL (ref 8–27)
CO2: 21 mmol/L (ref 20–29)
Calcium: 9.2 mg/dL (ref 8.6–10.2)
Chloride: 103 mmol/L (ref 96–106)
Creatinine, Ser: 1.02 mg/dL (ref 0.76–1.27)
GFR calc Af Amer: 90 mL/min/{1.73_m2} (ref >59)
GFR calc non Af Amer: 78 mL/min/{1.73_m2} (ref >59)
Glucose: 110 mg/dL — ABNORMAL HIGH (ref 65–99)
Potassium: 4.2 mmol/L (ref 3.5–5.2)
Sodium: 138 mmol/L (ref 134–144)

## 2020-03-28 ENCOUNTER — Ambulatory Visit (INDEPENDENT_AMBULATORY_CARE_PROVIDER_SITE_OTHER): Payer: Self-pay | Admitting: Physician Assistant

## 2020-03-28 ENCOUNTER — Encounter: Payer: Self-pay | Admitting: Physician Assistant

## 2020-03-28 ENCOUNTER — Ambulatory Visit (INDEPENDENT_AMBULATORY_CARE_PROVIDER_SITE_OTHER): Payer: Self-pay | Admitting: Pharmacist Clinician (PhC)/ Clinical Pharmacy Specialist

## 2020-03-28 ENCOUNTER — Other Ambulatory Visit: Payer: Self-pay

## 2020-03-28 VITALS — BP 144/72 | HR 74 | Ht 70.0 in | Wt 235.8 lb

## 2020-03-28 DIAGNOSIS — I480 Paroxysmal atrial fibrillation: Secondary | ICD-10-CM

## 2020-03-28 DIAGNOSIS — Z7901 Long term (current) use of anticoagulants: Secondary | ICD-10-CM

## 2020-03-28 DIAGNOSIS — I4891 Unspecified atrial fibrillation: Secondary | ICD-10-CM

## 2020-03-28 DIAGNOSIS — I10 Essential (primary) hypertension: Secondary | ICD-10-CM

## 2020-03-28 LAB — POCT INR: INR: 5.1 — AB (ref 2.0–3.0)

## 2020-03-28 MED ORDER — HYDRALAZINE HCL 10 MG PO TABS
10.0000 mg | ORAL_TABLET | Freq: Two times a day (BID) | ORAL | 3 refills | Status: DC
Start: 2020-03-28 — End: 2021-05-14

## 2020-03-28 NOTE — Patient Instructions (Addendum)
Medication Instructions:   START Hydralazine 10 mg 2 times a day  *If you need a refill on your cardiac medications before your next appointment, please call your pharmacy*  Lab Work: NONE ordered at this time of appointment   If you have labs (blood work) drawn today and your tests are completely normal, you will receive your results only by: Marland Kitchen MyChart Message (if you have MyChart) OR . A paper copy in the mail If you have any lab test that is abnormal or we need to change your treatment, we will call you to review the results.  Testing/Procedures: NONE ordered at this time of appointment   Follow-Up: At [facility], you and your health needs are our priority.  As part of our continuing mission to provide you with exceptional heart care, we have created designated Provider Care Teams.  These Care Teams include your primary Cardiologist (physician) and Advanced Practice Providers (APPs -  Physician Assistants and Nurse Practitioners) who all work together to provide you with the care you need, when you need it.   Your next appointment:   3-4 month(s)  The format for your next appointment:   In Person  Provider:   Rollene Rotunda, MD  Other Instructions  Contact our office if your palpitations become more frequent or worse.

## 2020-03-28 NOTE — Patient Instructions (Signed)
No warfarin Thursday June 10 or Friday June 11, then continue with 1/2 tablet daily except for 1 tablet every Monday, Wednesday and Friday. Repeat INR in 1 week.

## 2020-03-28 NOTE — Progress Notes (Signed)
Cardiology Office Note:    Date:  03/30/2020   ID:  Scott Townsend, DOB May 26, 1956, MRN [id number]  PCP:  Patient, No Pcp Per  [facility] Cardiologist:  Rollene Rotunda, MD  [facility] Electrophysiologist:  None   Referring MD: No ref. provider found   Chief Complaint  Patient presents with  . Follow-up    seen for Dr. Antoine Poche    History of Present Illness:    Scott Townsend is a 64 y.o. male with a hx of atrial fibrillation, Barrett's esophagus and hypertension.  He was diagnosed with atrial fibrillation in 2013 and has been cardioverted multiple times.  He was later started on flecainide.  He was last seen by Dr. Antoine Poche on 01/26/2020, he went back to atrial fibrillation and palpitation, he was started on Xarelto and had ramipril increased to 10 mg twice a day.  Unfortunately due to cost reasons, he was unable to take the Xarelto and was switched to Coumadin.  He also has significant dizziness and fatigue on the chlorthalidone, this was taken off.    He presents today for follow-up.  His blood pressure is elevated at 144/72, he says this is his average blood pressure now at home after he discontinued the chlorthalidone.  I recommend the addition of low-dose hydralazine 10 mg twice daily to help with his blood pressure.  I recommended systolic blood pressure goal of 120-130s range.  As for his atrial fibrillation, he has not noticed significant recurrence of atrial fibrillation.  He says his A. fib probably occurs about once a month and appears to be very transient in nature.  I recommended continuing the current dose of diltiazem and flecainide.  If he has increased palpitation in the future, we can consider increase the diltiazem to either 300 mg daily or 360 mg daily for better suppression of irregular rhythm.  Overall, he has been feeling well after he discontinued chlorthalidone.  He can follow-up with Dr. Antoine Poche in 3 months.   Past Medical History:  Diagnosis Date    . Atrial fibrillation   . Atrial fibrillation (HCC)   . Barrett esophagus   . Hypertension     Past Surgical History:  Procedure Laterality Date  . KNEE SURGERY Left     Current Medications: Current Meds  Medication Sig  . diltiazem (CARTIA XT) 240 MG 24 hr capsule Take 1 capsule (240 mg total) by mouth daily.  . diphenhydramine-acetaminophen (TYLENOL PM) 25-500 MG TABS Take 1 tablet by mouth at bedtime as needed.  . flecainide (TAMBOCOR) 100 MG tablet Take 1 tablet (100 mg total) by mouth 2 (two) times daily.  . lansoprazole (PREVACID) 30 MG capsule Take 30 mg by mouth daily.  . ramipril (ALTACE) 10 MG capsule Take 1 capsule (10 mg total) by mouth 2 (two) times daily.  Marland Kitchen warfarin (COUMADIN) 5 MG tablet Take 1 tablet (5 mg total) by mouth every evening.     Allergies:   Percocet [oxycodone-acetaminophen] and Vicodin [hydrocodone-acetaminophen]   Social History   Socioeconomic History  . Marital status: Married    Spouse name: Not on file  . Number of children: 3  . Years of education: Not on file  . Highest education level: Not on file  Occupational History    Employer: [employer]  Tobacco Use  . Smoking status: Former Smoker    Packs/day: 1.00    Years: 25.00    Pack years: 25.00    Types: Cigarettes  . Smokeless tobacco:  Never Used  . Tobacco comment: Off and on   Vaping Use  . Vaping Use: Never used  Substance and Sexual Activity  . Alcohol use: Yes  . Drug use: No  . Sexual activity: Not on file  Other Topics Concern  . Not on file  Social History Narrative   Works for [employer] Strain:   . Difficulty of Paying Living Expenses:   Food Insecurity:   . Worried About Charity fundraiser in the Last Year:   . Arboriculturist in the Last Year:   Transportation Needs:   . Film/video editor (Medical):   Marland Kitchen Lack of Transportation (Non-Medical):   Physical Activity:   . Days of Exercise per  Week:   . Minutes of Exercise per Session:   Stress:   . Feeling of Stress :   Social Connections:   . Frequency of Communication with Friends and Family:   . Frequency of Social Gatherings with Friends and Family:   . Attends Religious Services:   . Active Member of Clubs or Organizations:   . Attends Archivist Meetings:   Marland Kitchen Marital Status:      Family History: The patient's family history includes Heart attack (age of onset: 53) in his father; Stroke (age of onset: 45) in his father.  ROS:   Please see the history of present illness.     All other systems reviewed and are negative.  EKGs/Labs/Other Studies Reviewed:    The following studies were reviewed today:  ETT 06/27/2014 ETT Interpretation: normal - no evidence of ischemia by ST  analysis   Comments:  The patient had an good exercise tolerance. There was no chest  pain. There was an appropriate level of dyspnea. There were no  arrhythmias, a normal heart rate response. There were no  ischemic ST T wave changes and a normal heart rate recovery.   There was no proarrhythmic events with flecainide. He did have an  accelerated blood pressure response.  EKG:  EKG is not ordered today.   Recent Labs: 12/22/2019: Hemoglobin 16.5; Platelets 214 01/26/2020: TSH 1.060 03/27/2020: BUN 11; Creatinine, Ser 1.02; Potassium 4.2; Sodium 138  Recent Lipid Panel    Component Value Date/Time   CHOL 160 01/04/2017 1304   TRIG 86 01/04/2017 1304   HDL 61 01/04/2017 1304   CHOLHDL 2.6 01/04/2017 1304   VLDL 17 01/04/2017 1304   LDLCALC 82 01/04/2017 1304    Physical Exam:    VS:  BP (!) 144/72   Pulse 74   Ht 5\' 10"  (1.778 m)   Wt 235 lb 12.8 oz (107 kg)   SpO2 97%   BMI 33.83 kg/m     Wt Readings from Last 3 Encounters:  03/28/20 235 lb 12.8 oz (107 kg)  01/26/20 238 lb 9.6 oz (108.2 kg)  12/22/19 230 lb (104.3 kg)     GEN:  Well nourished, well developed in no acute distress HEENT: Normal NECK: No  JVD; No carotid bruits LYMPHATICS: No lymphadenopathy CARDIAC: RRR, no murmurs, rubs, gallops RESPIRATORY:  Clear to auscultation without rales, wheezing or rhonchi  ABDOMEN: Soft, non-tender, non-distended MUSCULOSKELETAL:  No edema; No deformity  SKIN: Warm and dry NEUROLOGIC:  Alert and oriented x 3 PSYCHIATRIC:  Normal affect   ASSESSMENT:    1. PAF (paroxysmal atrial fibrillation) ([place])   2. Essential hypertension    PLAN:    In order  of problems listed above:  1. PAF: Since the last visit, the frequency of atrial fibrillation has decreased.  He probably still has atrial fibrillation about once a month however duration is quite transient.  I recommended continue on the current dose of flecainide and the diltiazem.  If symptom recurs, we can increase the diltiazem further to either 300 mg daily or 360 mg daily.  2. Hypertension: Previously he did not feel very well on the chlorthalidone, since the chlorthalidone was taken off, he is feeling much better.  His blood pressure however has been elevated in the 140s at home, I recommended addition of low-dose hydralazine 10 mg twice daily.   Medication Adjustments/Labs and Tests Ordered: Current medicines are reviewed at length with the patient today.  Concerns regarding medicines are outlined above.  No orders of the defined types were placed in this encounter.  Meds ordered this encounter  Medications  . hydrALAZINE (APRESOLINE) 10 MG tablet    Sig: Take 1 tablet (10 mg total) by mouth in the morning and at bedtime.    Dispense:  180 tablet    Refill:  3    Patient Instructions  Medication Instructions:   START Hydralazine 10 mg 2 times a day  *If you need a refill on your cardiac medications before your next appointment, please call your pharmacy*  Lab Work: NONE ordered at this time of appointment   If you have labs (blood work) drawn today and your tests are completely normal, you will receive your results only  by: Marland Kitchen MyChart Message (if you have MyChart) OR . A paper copy in the mail If you have any lab test that is abnormal or we need to change your treatment, we will call you to review the results.  Testing/Procedures: NONE ordered at this time of appointment   Follow-Up: At [facility], you and your health needs are our priority.  As part of our continuing mission to provide you with exceptional heart care, we have created designated Provider Care Teams.  These Care Teams include your primary Cardiologist (physician) and Advanced Practice Providers (APPs -  Physician Assistants and Nurse Practitioners) who all work together to provide you with the care you need, when you need it.   Your next appointment:   3-4 month(s)  The format for your next appointment:   In Person  Provider:   Rollene Rotunda, MD  Other Instructions  Contact our office if your palpitations become more frequent or worse.     Ramond Dial, [place]  03/30/2020 11:24 PM    [facility]

## 2020-03-30 ENCOUNTER — Encounter: Payer: Self-pay | Admitting: Physician Assistant

## 2020-04-02 ENCOUNTER — Encounter (HOSPITAL_COMMUNITY): Payer: Self-pay

## 2020-04-02 ENCOUNTER — Ambulatory Visit (HOSPITAL_COMMUNITY)
Admission: EM | Admit: 2020-04-02 | Discharge: 2020-04-02 | Disposition: A | Discharge status: Home / Self Care | Payer: Self-pay

## 2020-04-02 ENCOUNTER — Other Ambulatory Visit: Payer: Self-pay

## 2020-04-02 DIAGNOSIS — L601 Onycholysis: Secondary | ICD-10-CM

## 2020-04-02 NOTE — Discharge Instructions (Signed)
There is no sign of active bleeding.  You need to gradually trim the toe nail back, you may also consider using a bandaid to keep the nail pressed down to prevent it from being pulled back  Warm soaks or warm compresses may also benefit you  Please follow up with your Primary care for further concerns   If nail rips and you are bleeding and are unable to stop this, go to the ED

## 2020-04-02 NOTE — ED Provider Notes (Signed)
[facility]    CSN: [id number] Arrival date & time: 04/02/20  0836      History   Chief Complaint Chief Complaint  Patient presents with  . toe issue    HPI Scott Townsend is a 64 y.o. male.   Patient presents for concern of left great toenail becoming detached. Reports is wife encouraged him to come in if she is worried about bleeding. Patient reports between 3 to 5 months ago he noticed a bruising discoloration under the nail and to the side. He is unsure whether he dropped any thing on the toe or injured it. This subsided however since then the left toe and nail has started to come up somewhat. Reports occasional pain. No significant swelling. There has been no active bleeding. Patient has primary care and cardiologist he follows with regularly. Patient is on Coumadin and this is his biggest concern for bleeding. No fevers or chills.     Past Medical History:  Diagnosis Date  . Atrial fibrillation   . Atrial fibrillation (HCC)   . Barrett esophagus   . Hypertension     Patient Active Problem List   Diagnosis Date Noted  . Atrial fibrillation (HCC) 03/08/2020  . Long term (current) use of anticoagulants 03/08/2020  . Paroxysmal atrial fibrillation (HCC) 01/25/2020  . Essential hypertension 01/25/2020  . Educated about COVID-19 virus infection 01/25/2020  . Atrial fibrillation with RVR (HCC) 02/16/2012    Past Surgical History:  Procedure Laterality Date  . KNEE SURGERY Left        Home Medications    Prior to Admission medications   Medication Sig Start Date End Date Taking? Authorizing Provider  diltiazem (CARTIA XT) 240 MG 24 hr capsule Take 1 capsule (240 mg total) by mouth daily. 02/17/19   Rollene Rotunda, MD  diphenhydramine-acetaminophen (TYLENOL PM) 25-500 MG TABS Take 1 tablet by mouth at bedtime as needed.    [provider]  flecainide (TAMBOCOR) 100 MG tablet Take 1 tablet (100 mg total) by mouth 2 (two) times daily. 01/18/20    Rollene Rotunda, MD  hydrALAZINE (APRESOLINE) 10 MG tablet Take 1 tablet (10 mg total) by mouth in the morning and at bedtime. 03/28/20 03/23/21  Azalee Course, PA  lansoprazole (PREVACID) 30 MG capsule Take 30 mg by mouth daily.    [provider]  ramipril (ALTACE) 10 MG capsule Take 1 capsule (10 mg total) by mouth 2 (two) times daily. 01/26/20 07/24/20  Rollene Rotunda, MD  warfarin (COUMADIN) 5 MG tablet Take 1 tablet (5 mg total) by mouth every evening. 03/08/20   Rollene Rotunda, MD    Family History Family History  Problem Relation Age of Onset  . Heart attack Father 62  . Stroke Father 33    Social History Social History   Tobacco Use  . Smoking status: Former Smoker    Packs/day: 1.00    Years: 25.00    Pack years: 25.00    Types: Cigarettes  . Smokeless tobacco: Never Used  . Tobacco comment: Off and on   Vaping Use  . Vaping Use: Never used  Substance Use Topics  . Alcohol use: Yes    Comment: occ  . Drug use: No     Allergies   Percocet [oxycodone-acetaminophen] and Vicodin [hydrocodone-acetaminophen]   Review of Systems Review of Systems   Physical Exam Triage Vital Signs ED Triage Vitals  Enc Vitals Group     BP 04/02/20 0903 (!) 152/76  Pulse Rate 04/02/20 0903 66     Resp 04/02/20 0903 16     Temp 04/02/20 0903 98.2 F (36.8 C)     Temp Source 04/02/20 0903 Oral     SpO2 04/02/20 0903 98 %     Weight 04/02/20 0904 225 lb (102.1 kg)     Height 04/02/20 0904 5\' 10"  (1.778 m)     Head Circumference --      Peak Flow --      Pain Score 04/02/20 0904 3     Pain Loc --      Pain Edu? --      Excl. in GC? --    No data found.  Updated Vital Signs BP (!) 152/76   Pulse 66   Temp 98.2 F (36.8 C) (Oral)   Resp 16   Ht 5\' 10"  (1.778 m)   Wt 225 lb (102.1 kg)   SpO2 98%   BMI 32.28 kg/m   Visual Acuity Right Eye Distance:   Left Eye Distance:   Bilateral Distance:    Right Eye Near:   Left Eye Near:    Bilateral Near:      Physical Exam Vitals and nursing note reviewed.  Constitutional:      Appearance: Normal appearance.  Skin:    Comments: Left great toe nail with evidence with yellow discoloration.  Detached from nail bed and distal aspects.  Connected to cuticle.  No cuticle swelling or nail fold swelling.  Mild tenderness at the cuticle.  No evidence of infection or bleeding  Neurological:     Mental Status: He is alert.      UC Treatments / Results  Labs (all labs ordered are listed, but only abnormal results are displayed) Labs Reviewed - No data to display  EKG   Radiology No results found.  Procedures Procedures (including critical care time)  Medications Ordered in UC Medications - No data to display  Initial Impression / Assessment and Plan / UC Course  I have reviewed the triage vital signs and the nursing notes.  Pertinent labs & imaging results that were available during my care of the patient were reviewed by me and considered in my medical decision making (see chart for details).     #Detachment of nail Patient is a 64 year old of the attaching left great toenail.  Is likely secondary to recent subungual hematoma or possibly history of onychomycosis.  Discussed that he needs to trim the nail to ensure less possibility of ripping off causing bleeding episode.  Patient is not insured we will have him follow-up with primary care for evaluation and monitoring as opposed to immediate referral to podiatry, as I feel he can trim the nail and monitor.  Patient is agreeable to this plan. Final Clinical Impressions(s) / UC Diagnoses   Final diagnoses:  Detachment of nail     Discharge Instructions     There is no sign of active bleeding.  You need to gradually trim the toe nail back, you may also consider using a bandaid to keep the nail pressed down to prevent it from being pulled back  Warm soaks or warm compresses may also benefit you  Please follow up with your Primary  care for further concerns   If nail rips and you are bleeding and are unable to stop this, go to the ED      ED Prescriptions    None     PDMP not reviewed this encounter.   Shakthi Scipio,  Marguerita Beards, PA-C 04/02/20 2315

## 2020-04-02 NOTE — ED Triage Notes (Signed)
Pt c/o ecchymotic area under 1st left digit of foot and it is pushing the nail upx3-54mos. Pt came in, because he is on coumadin and doesn't want to lose the nail and start bleeding profusely.

## 2020-04-03 ENCOUNTER — Ambulatory Visit (INDEPENDENT_AMBULATORY_CARE_PROVIDER_SITE_OTHER): Payer: Self-pay | Admitting: Pharmacist

## 2020-04-03 DIAGNOSIS — I480 Paroxysmal atrial fibrillation: Secondary | ICD-10-CM

## 2020-04-03 DIAGNOSIS — I4891 Unspecified atrial fibrillation: Secondary | ICD-10-CM

## 2020-04-03 DIAGNOSIS — Z7901 Long term (current) use of anticoagulants: Secondary | ICD-10-CM

## 2020-04-03 LAB — POCT INR: INR: 3 (ref 2.0–3.0)

## 2020-04-03 NOTE — Patient Instructions (Signed)
Description   Decrease Wednesday dose to 1/2 a tablet (2.5mg ). Take 1/2 a tablet daily except for 1 tablet every Monday and Friday. Repeat INR in 1 week.

## 2020-04-05 NOTE — Telephone Encounter (Signed)
Thank you. I agree 

## 2020-04-05 NOTE — Telephone Encounter (Signed)
No. BNP is borderline, I recommend continue the current therapy

## 2020-04-05 NOTE — Telephone Encounter (Signed)
Per last PA note: He says his A. fib probably occurs about once a month and appears to be very transient in nature.  I recommended continuing the current dose of diltiazem and flecainide.  If he has increased palpitation in the future, we can consider increase the diltiazem to either 300 mg daily or 360 mg daily for better suppression of irregular rhythm.

## 2020-04-12 ENCOUNTER — Ambulatory Visit (INDEPENDENT_AMBULATORY_CARE_PROVIDER_SITE_OTHER): Payer: Self-pay | Admitting: Pharmacist

## 2020-04-12 ENCOUNTER — Other Ambulatory Visit: Payer: Self-pay

## 2020-04-12 ENCOUNTER — Other Ambulatory Visit: Payer: Self-pay | Admitting: Cardiology

## 2020-04-12 DIAGNOSIS — I4891 Unspecified atrial fibrillation: Secondary | ICD-10-CM

## 2020-04-12 DIAGNOSIS — Z7901 Long term (current) use of anticoagulants: Secondary | ICD-10-CM

## 2020-04-12 LAB — POCT INR: INR: 5.4 — AB (ref 2.0–3.0)

## 2020-04-15 MED ORDER — DILTIAZEM HCL ER COATED BEADS 240 MG PO CP24: 240.0000 mg | ORAL_CAPSULE | Freq: Every day | ORAL | 3 refills | Status: DC

## 2020-04-24 ENCOUNTER — Ambulatory Visit (INDEPENDENT_AMBULATORY_CARE_PROVIDER_SITE_OTHER): Payer: Self-pay

## 2020-04-24 ENCOUNTER — Other Ambulatory Visit: Payer: Self-pay

## 2020-04-24 DIAGNOSIS — Z7901 Long term (current) use of anticoagulants: Secondary | ICD-10-CM

## 2020-04-24 DIAGNOSIS — Z5181 Encounter for therapeutic drug level monitoring: Secondary | ICD-10-CM

## 2020-04-24 DIAGNOSIS — I4891 Unspecified atrial fibrillation: Secondary | ICD-10-CM

## 2020-04-24 DIAGNOSIS — I480 Paroxysmal atrial fibrillation: Secondary | ICD-10-CM

## 2020-04-24 LAB — POCT INR: INR: 1.2 — AB (ref 2.0–3.0)

## 2020-04-24 NOTE — Patient Instructions (Signed)
Take 1.5 tablets today and then start taking 0.5 tablet daily except for 1 tablet every Monday and Friday. Repeat INR in 1 weeks.

## 2020-05-01 ENCOUNTER — Ambulatory Visit (INDEPENDENT_AMBULATORY_CARE_PROVIDER_SITE_OTHER): Payer: Self-pay

## 2020-05-01 ENCOUNTER — Other Ambulatory Visit: Payer: Self-pay

## 2020-05-01 DIAGNOSIS — I4891 Unspecified atrial fibrillation: Secondary | ICD-10-CM

## 2020-05-01 DIAGNOSIS — Z7901 Long term (current) use of anticoagulants: Secondary | ICD-10-CM

## 2020-05-01 DIAGNOSIS — Z5181 Encounter for therapeutic drug level monitoring: Secondary | ICD-10-CM

## 2020-05-01 DIAGNOSIS — I480 Paroxysmal atrial fibrillation: Secondary | ICD-10-CM

## 2020-05-01 LAB — POCT INR: INR: 2.5 (ref 2.0–3.0)

## 2020-05-01 NOTE — Patient Instructions (Signed)
Start taking 0.5 tablet daily except for 1 tablet every  Friday. Repeat INR in 2 weeks.

## 2020-05-02 ENCOUNTER — Other Ambulatory Visit: Payer: Self-pay

## 2020-05-02 DIAGNOSIS — I4891 Unspecified atrial fibrillation: Secondary | ICD-10-CM

## 2020-05-02 DIAGNOSIS — Z7901 Long term (current) use of anticoagulants: Secondary | ICD-10-CM

## 2020-05-03 ENCOUNTER — Other Ambulatory Visit: Payer: Self-pay

## 2020-05-03 DIAGNOSIS — Z7901 Long term (current) use of anticoagulants: Secondary | ICD-10-CM

## 2020-05-03 DIAGNOSIS — I4891 Unspecified atrial fibrillation: Secondary | ICD-10-CM

## 2020-05-03 MED ORDER — WARFARIN SODIUM 5 MG PO TABS: 5.0000 mg | ORAL_TABLET | Freq: Every evening | ORAL | 0 refills | Status: DC

## 2020-05-15 ENCOUNTER — Other Ambulatory Visit: Payer: Self-pay

## 2020-05-15 ENCOUNTER — Ambulatory Visit (INDEPENDENT_AMBULATORY_CARE_PROVIDER_SITE_OTHER): Payer: Self-pay

## 2020-05-15 DIAGNOSIS — Z5181 Encounter for therapeutic drug level monitoring: Secondary | ICD-10-CM

## 2020-05-15 DIAGNOSIS — I480 Paroxysmal atrial fibrillation: Secondary | ICD-10-CM

## 2020-05-15 DIAGNOSIS — I4891 Unspecified atrial fibrillation: Secondary | ICD-10-CM

## 2020-05-15 DIAGNOSIS — Z7901 Long term (current) use of anticoagulants: Secondary | ICD-10-CM

## 2020-05-15 LAB — POCT INR: INR: 3.6 — AB (ref 2.0–3.0)

## 2020-05-15 MED ORDER — WARFARIN SODIUM 3 MG PO TABS: 3.0000 mg | ORAL_TABLET | Freq: Every evening | ORAL | 0 refills | Status: DC

## 2020-05-15 NOTE — Patient Instructions (Signed)
Take 1 tablet daily except 1/2 tablet on Wednesday.  Repeat INR in 2 weeks.

## 2020-05-29 ENCOUNTER — Ambulatory Visit (INDEPENDENT_AMBULATORY_CARE_PROVIDER_SITE_OTHER): Payer: Self-pay

## 2020-05-29 ENCOUNTER — Other Ambulatory Visit: Payer: Self-pay

## 2020-05-29 DIAGNOSIS — I480 Paroxysmal atrial fibrillation: Secondary | ICD-10-CM

## 2020-05-29 DIAGNOSIS — Z5181 Encounter for therapeutic drug level monitoring: Secondary | ICD-10-CM

## 2020-05-29 DIAGNOSIS — I4891 Unspecified atrial fibrillation: Secondary | ICD-10-CM

## 2020-05-29 DIAGNOSIS — Z7901 Long term (current) use of anticoagulants: Secondary | ICD-10-CM

## 2020-05-29 LAB — POCT INR: INR: 3.1 — AB (ref 2.0–3.0)

## 2020-05-29 NOTE — Patient Instructions (Signed)
Continue taking 1 tablet daily except 1/2 tablet on Wednesday.  Repeat INR in 2 weeks. Per Belenda Cruise.

## 2020-06-14 ENCOUNTER — Ambulatory Visit (INDEPENDENT_AMBULATORY_CARE_PROVIDER_SITE_OTHER): Payer: Self-pay | Admitting: Pharmacist

## 2020-06-14 ENCOUNTER — Other Ambulatory Visit: Payer: Self-pay

## 2020-06-14 DIAGNOSIS — I480 Paroxysmal atrial fibrillation: Secondary | ICD-10-CM

## 2020-06-14 DIAGNOSIS — I4891 Unspecified atrial fibrillation: Secondary | ICD-10-CM

## 2020-06-14 DIAGNOSIS — Z7901 Long term (current) use of anticoagulants: Secondary | ICD-10-CM

## 2020-06-14 LAB — POCT INR: INR: 2 (ref 2.0–3.0)

## 2020-06-14 NOTE — Patient Instructions (Signed)
Description    Continue taking 1 tablet daily except 1/2 tablet on Wednesday.  Repeat INR in 2 weeks. Per Belenda Cruise.

## 2020-06-27 NOTE — Progress Notes (Signed)
Cardiology Office Note   Date:  06/28/2020   ID:  Scott Townsend, DOB 09/30/1956, MRN [id number]  PCP:  Patient, No Pcp Per  Cardiologist:   Rollene Rotunda, MD   Chief Complaint  Patient presents with  . Palpitations      History of Present Illness: Scott Townsend is a 64 y.o. male who presents for follow up of atrial fibrillation.  He was diagnosed with atrial fibrillation in 2013 and has been cardioverted multiple times.  He was started on flecainide.  He  went back to atrial fibrillation and palpitation, he was started on Xarelto and had ramipril increased to 10 mg twice a day.  Unfortunately due to cost reasons, he was unable to take the Xarelto and was switched to Coumadin.  He also has significant dizziness and fatigue on the chlorthalidone.    Since he was last seen he has had some increased palpitations but he knows that this is not atrial fibrillation.  He feels much different than that.  These are increasing skipped beats.  The patient denies any new symptoms such as chest discomfort, neck or arm discomfort. There has been no new shortness of breath, PND or orthopnea. There have been no reported palpitations, presyncope or syncope.    Past Medical History:  Diagnosis Date  . Atrial fibrillation   . Atrial fibrillation (HCC)   . Barrett esophagus   . Hypertension     Past Surgical History:  Procedure Laterality Date  . KNEE SURGERY Left      Current Outpatient Medications  Medication Sig Dispense Refill  . diltiazem (CARTIA XT) 300 MG 24 hr capsule Take 1 capsule (300 mg total) by mouth daily. 90 capsule 3  . diphenhydramine-acetaminophen (TYLENOL PM) 25-500 MG TABS Take 1 tablet by mouth at bedtime as needed.    . flecainide (TAMBOCOR) 100 MG tablet Take 1 tablet (100 mg total) by mouth 2 (two) times daily. 60 tablet 5  . hydrALAZINE (APRESOLINE) 10 MG tablet Take 1 tablet (10 mg total) by mouth in the morning and at bedtime. 180 tablet 3  .  lansoprazole (PREVACID) 30 MG capsule Take 30 mg by mouth daily.    . ramipril (ALTACE) 10 MG capsule Take 1 capsule (10 mg total) by mouth 2 (two) times daily. 180 capsule 3  . warfarin (COUMADIN) 3 MG tablet Take 1 tablet (3 mg total) by mouth every evening. 50 tablet 0   No current facility-administered medications for this visit.    Allergies:   Percocet [oxycodone-acetaminophen] and Vicodin [hydrocodone-acetaminophen]    ROS:  Please see the history of present illness.   Otherwise, review of systems are positive for none.   All other systems are reviewed and negative.    PHYSICAL EXAM: VS:  BP (!) 142/70   Pulse 67   Ht 5\' 10"  (1.778 m)   Wt 232 lb (105.2 kg)   SpO2 (!) 9%   BMI 33.29 kg/m  , BMI Body mass index is 33.29 kg/m. GENERAL:  Well appearing NECK:  No jugular venous distention, waveform within normal limits, carotid upstroke brisk and symmetric, no bruits, no thyromegaly LUNGS:  Clear to auscultation bilaterally CHEST:  Unremarkable HEART:  PMI not displaced or sustained,S1 and S2 within normal limits, no S3, no S4, no clicks, no rubs, no murmurs ABD:  Flat, positive bowel sounds normal in frequency in pitch, no bruits, no rebound, no guarding, no midline pulsatile mass, no hepatomegaly, no splenomegaly EXT:  2 plus pulses throughout, no edema, no cyanosis no clubbing  EKG:  EKG is not ordered today.   Recent Labs: 12/22/2019: Hemoglobin 16.5; Platelets 214 01/26/2020: TSH 1.060 03/27/2020: BUN 11; Creatinine, Ser 1.02; Potassium 4.2; Sodium 138    Lipid Panel    Component Value Date/Time   CHOL 160 01/04/2017 1304   TRIG 86 01/04/2017 1304   HDL 61 01/04/2017 1304   CHOLHDL 2.6 01/04/2017 1304   VLDL 17 01/04/2017 1304   LDLCALC 82 01/04/2017 1304      Wt Readings from Last 3 Encounters:  06/28/20 232 lb (105.2 kg)  04/02/20 225 lb (102.1 kg)  03/28/20 235 lb 12.8 oz (107 kg)      Other studies Reviewed: Additional studies/ records that were  reviewed today include: None. Review of the above records demonstrates:  NA   ASSESSMENT AND PLAN:  PAF:   He has had no symptomatic paroxysms of this.  No change in therapy other than as below.  PALPITATIONS: He is describing palpitations that are likely premature ectopic beats rather than fibrillation.  I am going to try to increase his Cardizem to 300 mg daily.  Knowing that he had lightheadedness previously if he does have lightheadedness or his blood pressure drops too low I would rather back off on the hydralazine or ACE inhibitor.  HTN: Blood pressure is slightly above target and will be managed as above.  COVID EDUCATION: He has had his vaccine.  Current medicines are reviewed at length with the patient today.  The patient does not have concerns regarding medicines.  The following changes have been made:  As above  Labs/ tests ordered today include: NA No orders of the defined types were placed in this encounter.    Disposition:   FU with me in six months.     Signed, Rollene Rotunda, MD  06/28/2020 9:33 AM    [facility]

## 2020-06-28 ENCOUNTER — Encounter: Payer: Self-pay | Admitting: Cardiology

## 2020-06-28 ENCOUNTER — Ambulatory Visit (INDEPENDENT_AMBULATORY_CARE_PROVIDER_SITE_OTHER): Payer: Self-pay | Admitting: Cardiology

## 2020-06-28 ENCOUNTER — Other Ambulatory Visit: Payer: Self-pay

## 2020-06-28 ENCOUNTER — Ambulatory Visit (INDEPENDENT_AMBULATORY_CARE_PROVIDER_SITE_OTHER): Payer: Self-pay

## 2020-06-28 VITALS — BP 142/70 | HR 67 | Ht 70.0 in | Wt 232.0 lb

## 2020-06-28 DIAGNOSIS — Z7901 Long term (current) use of anticoagulants: Secondary | ICD-10-CM

## 2020-06-28 DIAGNOSIS — Z5181 Encounter for therapeutic drug level monitoring: Secondary | ICD-10-CM

## 2020-06-28 DIAGNOSIS — I4891 Unspecified atrial fibrillation: Secondary | ICD-10-CM

## 2020-06-28 DIAGNOSIS — Z7189 Other specified counseling: Secondary | ICD-10-CM

## 2020-06-28 DIAGNOSIS — I10 Essential (primary) hypertension: Secondary | ICD-10-CM

## 2020-06-28 DIAGNOSIS — I480 Paroxysmal atrial fibrillation: Secondary | ICD-10-CM

## 2020-06-28 LAB — POCT INR: INR: 2.4 (ref 2.0–3.0)

## 2020-06-28 MED ORDER — DILTIAZEM HCL ER COATED BEADS 300 MG PO CP24: 300.0000 mg | ORAL_CAPSULE | Freq: Every day | ORAL | 3 refills | Status: DC

## 2020-06-28 NOTE — Patient Instructions (Addendum)
Medication Instructions:  INCREASE DILTIAZEM TO 300 MG DAILY   *If you need a refill on your cardiac medications before your next appointment, please call your pharmacy*  Lab Work: NONE  Testing/Procedures: NONE    Follow-Up: At [facility], you and your health needs are our priority.  As part of our continuing mission to provide you with exceptional heart care, we have created designated Provider Care Teams.  These Care Teams include your primary Cardiologist (physician) and Advanced Practice Providers (APPs -  Physician Assistants and Nurse Practitioners) who all work together to provide you with the care you need, when you need it.  We recommend signing up for the patient portal called "MyChart".  Sign up information is provided on this After Visit Summary.  MyChart is used to connect with patients for Virtual Visits (Telemedicine).  Patients are able to view lab/test results, encounter notes, upcoming appointments, etc.  Non-urgent messages can be sent to your provider as well.   To learn more about what you can do with MyChart, go to [email or web address].    Your next appointment:   6 month(s)  The format for your next appointment:   In Person  Provider:   You may see Rollene Rotunda, MD or one of the following Advanced Practice Providers on your designated Care Team:    Theodore Demark, PA-C  Joni Reining, DNP, ANP  Cadence Fransico Michael, [place]

## 2020-06-28 NOTE — Patient Instructions (Signed)
Continue taking 1 tablet daily except 1/2 tablet on Wednesday.  Repeat INR in 4 weeks.

## 2020-07-07 ENCOUNTER — Other Ambulatory Visit: Payer: Self-pay

## 2020-07-07 DIAGNOSIS — Z7901 Long term (current) use of anticoagulants: Secondary | ICD-10-CM

## 2020-07-07 DIAGNOSIS — I4891 Unspecified atrial fibrillation: Secondary | ICD-10-CM

## 2020-07-08 MED ORDER — WARFARIN SODIUM 3 MG PO TABS: ORAL_TABLET | ORAL | 0 refills | Status: DC

## 2020-07-08 NOTE — Telephone Encounter (Signed)
Last INR check 06/28/20

## 2020-07-17 ENCOUNTER — Other Ambulatory Visit: Payer: Self-pay | Admitting: Cardiology

## 2020-07-18 MED ORDER — FLECAINIDE ACETATE 100 MG PO TABS
100.0000 mg | ORAL_TABLET | Freq: Two times a day (BID) | ORAL | 11 refills | Status: DC
Start: 2020-07-18 — End: 2021-06-26

## 2020-07-26 ENCOUNTER — Ambulatory Visit (INDEPENDENT_AMBULATORY_CARE_PROVIDER_SITE_OTHER): Payer: Self-pay

## 2020-07-26 ENCOUNTER — Other Ambulatory Visit: Payer: Self-pay

## 2020-07-26 DIAGNOSIS — I4891 Unspecified atrial fibrillation: Secondary | ICD-10-CM

## 2020-07-26 DIAGNOSIS — Z5181 Encounter for therapeutic drug level monitoring: Secondary | ICD-10-CM

## 2020-07-26 DIAGNOSIS — Z7901 Long term (current) use of anticoagulants: Secondary | ICD-10-CM

## 2020-07-26 DIAGNOSIS — I480 Paroxysmal atrial fibrillation: Secondary | ICD-10-CM

## 2020-07-26 LAB — POCT INR: INR: 2.8 (ref 2.0–3.0)

## 2020-07-26 MED ORDER — WARFARIN SODIUM 3 MG PO TABS: ORAL_TABLET | ORAL | 1 refills | Status: DC

## 2020-07-26 NOTE — Patient Instructions (Signed)
Continue taking 1 tablet daily except 1/2 tablet on Wednesday.  Repeat INR in 6 weeks. 

## 2020-09-06 ENCOUNTER — Ambulatory Visit (INDEPENDENT_AMBULATORY_CARE_PROVIDER_SITE_OTHER): Payer: Self-pay

## 2020-09-06 ENCOUNTER — Other Ambulatory Visit: Payer: Self-pay

## 2020-09-06 DIAGNOSIS — I480 Paroxysmal atrial fibrillation: Secondary | ICD-10-CM

## 2020-09-06 DIAGNOSIS — I4891 Unspecified atrial fibrillation: Secondary | ICD-10-CM

## 2020-09-06 DIAGNOSIS — Z7901 Long term (current) use of anticoagulants: Secondary | ICD-10-CM

## 2020-09-06 DIAGNOSIS — Z5181 Encounter for therapeutic drug level monitoring: Secondary | ICD-10-CM

## 2020-09-06 LAB — POCT INR: INR: 3.1 — AB (ref 2.0–3.0)

## 2020-09-06 NOTE — Patient Instructions (Signed)
Continue taking 1 tablet daily except 1/2 tablet on Wednesday.  Repeat INR in 6 weeks. Eat greens over the next 2 days.

## 2020-09-09 MED ORDER — WARFARIN SODIUM 3 MG PO TABS: ORAL_TABLET | ORAL | 1 refills | Status: DC

## 2020-10-16 ENCOUNTER — Ambulatory Visit (INDEPENDENT_AMBULATORY_CARE_PROVIDER_SITE_OTHER): Payer: Self-pay

## 2020-10-16 ENCOUNTER — Other Ambulatory Visit: Payer: Self-pay

## 2020-10-16 DIAGNOSIS — I480 Paroxysmal atrial fibrillation: Secondary | ICD-10-CM

## 2020-10-16 DIAGNOSIS — Z5181 Encounter for therapeutic drug level monitoring: Secondary | ICD-10-CM

## 2020-10-16 DIAGNOSIS — I4891 Unspecified atrial fibrillation: Secondary | ICD-10-CM

## 2020-10-16 DIAGNOSIS — Z7901 Long term (current) use of anticoagulants: Secondary | ICD-10-CM

## 2020-10-16 LAB — POCT INR: INR: 2.3 (ref 2.0–3.0)

## 2020-10-16 NOTE — Patient Instructions (Signed)
Continue taking 1 tablet daily except 1/2 tablet on Wednesday.  Repeat INR in 6 weeks. 

## 2020-10-25 ENCOUNTER — Encounter: Payer: Self-pay | Admitting: Cardiology

## 2020-10-26 ENCOUNTER — Ambulatory Visit: Payer: Self-pay | Attending: Internal Medicine

## 2020-10-26 DIAGNOSIS — Z23 Encounter for immunization: Secondary | ICD-10-CM

## 2020-10-26 NOTE — Progress Notes (Signed)
   Covid-19 Vaccination Clinic  Name:  Scott Townsend    MRN: [id number] DOB: [birth year]  10/26/2020  [name] was observed post Covid-19 immunization for 15 minutes without incident. He was provided with Vaccine Information Sheet and instruction to access the V-Safe system.   [name] was instructed to call 911 with any severe reactions post vaccine: Marland Kitchen Difficulty breathing  . Swelling of face and throat  . A fast heartbeat  . A bad rash all over body  . Dizziness and weakness   Immunizations Administered    Name Date Dose VIS Date Route   Pfizer COVID-19 Vaccine 10/26/2020  9:55 AM 0.3 mL 08/07/2020 Intramuscular   Manufacturer: [employer], [employer]   Lot: [id number]   NDC: [address]-1

## 2020-11-27 ENCOUNTER — Ambulatory Visit (INDEPENDENT_AMBULATORY_CARE_PROVIDER_SITE_OTHER): Payer: Self-pay

## 2020-11-27 ENCOUNTER — Other Ambulatory Visit: Payer: Self-pay

## 2020-11-27 DIAGNOSIS — Z7901 Long term (current) use of anticoagulants: Secondary | ICD-10-CM

## 2020-11-27 DIAGNOSIS — Z5181 Encounter for therapeutic drug level monitoring: Secondary | ICD-10-CM

## 2020-11-27 DIAGNOSIS — I480 Paroxysmal atrial fibrillation: Secondary | ICD-10-CM

## 2020-11-27 DIAGNOSIS — I4891 Unspecified atrial fibrillation: Secondary | ICD-10-CM

## 2020-11-27 LAB — POCT INR: INR: 2.9 (ref 2.0–3.0)

## 2020-11-27 NOTE — Patient Instructions (Signed)
Continue taking 1 tablet daily except 1/2 tablet on Wednesday.  Repeat INR in 6 weeks.

## 2020-12-26 ENCOUNTER — Ambulatory Visit: Payer: Self-pay | Admitting: Cardiology

## 2021-01-05 ENCOUNTER — Encounter (HOSPITAL_COMMUNITY): Payer: Self-pay | Admitting: Emergency Medicine

## 2021-01-05 ENCOUNTER — Emergency Department (HOSPITAL_BASED_OUTPATIENT_CLINIC_OR_DEPARTMENT_OTHER)
Admission: RE | Admit: 2021-01-05 | Discharge: 2021-01-05 | Disposition: A | Discharge status: Home / Self Care | Payer: Self-pay | Source: Ambulatory Visit | Attending: Emergency Medicine | Admitting: Emergency Medicine

## 2021-01-05 ENCOUNTER — Other Ambulatory Visit: Payer: Self-pay

## 2021-01-05 ENCOUNTER — Ambulatory Visit (HOSPITAL_COMMUNITY)
Admission: EM | Admit: 2021-01-05 | Discharge: 2021-01-05 | Disposition: A | Discharge status: Home / Self Care | Payer: Self-pay

## 2021-01-05 ENCOUNTER — Emergency Department (HOSPITAL_COMMUNITY)
Admission: EM | Admit: 2021-01-05 | Discharge: 2021-01-05 | Disposition: A | Discharge status: Home / Self Care | Payer: Self-pay | Attending: Emergency Medicine | Admitting: Emergency Medicine

## 2021-01-05 DIAGNOSIS — S8011XA Contusion of right lower leg, initial encounter: Secondary | ICD-10-CM

## 2021-01-05 DIAGNOSIS — R600 Localized edema: Secondary | ICD-10-CM

## 2021-01-05 DIAGNOSIS — I10 Essential (primary) hypertension: Secondary | ICD-10-CM | POA: Insufficient documentation

## 2021-01-05 DIAGNOSIS — X58XXXA Exposure to other specified factors, initial encounter: Secondary | ICD-10-CM | POA: Insufficient documentation

## 2021-01-05 DIAGNOSIS — M79661 Pain in right lower leg: Secondary | ICD-10-CM

## 2021-01-05 DIAGNOSIS — Z7901 Long term (current) use of anticoagulants: Secondary | ICD-10-CM | POA: Insufficient documentation

## 2021-01-05 DIAGNOSIS — M79604 Pain in right leg: Secondary | ICD-10-CM | POA: Insufficient documentation

## 2021-01-05 DIAGNOSIS — Z8616 Personal history of COVID-19: Secondary | ICD-10-CM | POA: Insufficient documentation

## 2021-01-05 DIAGNOSIS — Z79899 Other long term (current) drug therapy: Secondary | ICD-10-CM | POA: Insufficient documentation

## 2021-01-05 DIAGNOSIS — Z87891 Personal history of nicotine dependence: Secondary | ICD-10-CM | POA: Insufficient documentation

## 2021-01-05 LAB — BASIC METABOLIC PANEL
Anion gap: 8 (ref 5–15)
BUN: 12 mg/dL (ref 8–23)
CO2: 22 mmol/L (ref 22–32)
Calcium: 8.6 mg/dL — ABNORMAL LOW (ref 8.9–10.3)
Chloride: 104 mmol/L (ref 98–111)
Creatinine, Ser: 0.95 mg/dL (ref 0.61–1.24)
GFR, Estimated: >60 mL/min (ref >60)
Glucose, Bld: 110 mg/dL — ABNORMAL HIGH (ref 70–99)
Potassium: 3.5 mmol/L (ref 3.5–5.1)
Sodium: 134 mmol/L — ABNORMAL LOW (ref 135–145)

## 2021-01-05 LAB — CBC
HCT: 43.1 % (ref 39.0–52.0)
Hemoglobin: 15 g/dL (ref 13.0–17.0)
MCH: 31.9 pg (ref 26.0–34.0)
MCHC: 34.8 g/dL (ref 30.0–36.0)
MCV: 91.7 fL (ref 80.0–100.0)
Platelets: 239 10*3/uL (ref 150–400)
RBC: 4.7 MIL/uL (ref 4.22–5.81)
RDW: 13.2 % (ref 11.5–15.5)
WBC: 8 10*3/uL (ref 4.0–10.5)
nRBC: 0 % (ref 0.0–0.2)

## 2021-01-05 LAB — PROTIME-INR
INR: 2.6 — ABNORMAL HIGH (ref 0.8–1.2)
Prothrombin Time: 26.6 seconds — ABNORMAL HIGH (ref 11.4–15.2)

## 2021-01-05 NOTE — Discharge Instructions (Addendum)
Elevate leg, ace wrap for swelling,  Return if any problems.  Follow up with your Physician for recheck in 2 days.   You may have a small muscle tear that bleed or a blood vessel that bleed.

## 2021-01-05 NOTE — Progress Notes (Signed)
VASCULAR LAB    Right lower extremity venous duplex has been performed.  See CV proc for preliminary results.  Messaged results to  Dr. Rosalia Hammers via secure chat  Rosezetta Schlatter, [facility], RVT 01/05/2021, 1:47 PM

## 2021-01-05 NOTE — ED Notes (Signed)
Patient is being discharged from the Urgent Care and sent to the Emergency Department via POV. Per Clent Jacks, PA, patient is in need of higher level of care due to suspected dvt. Patient is aware and verbalizes understanding of plan of care.  Vitals:   01/05/21 1149  BP: (!) 164/83  Pulse: 78  Resp: 18  Temp: 97.8 F (36.6 C)  SpO2: 97%

## 2021-01-05 NOTE — ED Notes (Signed)
Patient transported to Ultrasound 

## 2021-01-05 NOTE — ED Provider Notes (Signed)
[facility]GENCY [facility] Provider Note   CSN: [id number] Arrival date & time: 01/05/21  1213     History Chief Complaint  Patient presents with  . Leg Pain    Scott Townsend is a 65 y.o. male.  The history is provided by the patient. No language interpreter was used.  Leg Pain Location:  Leg Injury: no   Leg location:  R leg Pain details:    Quality:  Pressure   Radiates to:  Does not radiate   Severity:  Moderate   Onset quality:  Gradual   Timing:  Constant Relieved by:  Nothing Worsened by:  Nothing Ineffective treatments:  None tried      Past Medical History:  Diagnosis Date  . Atrial fibrillation   . Atrial fibrillation (HCC)   . Barrett esophagus   . Hypertension     Patient Active Problem List   Diagnosis Date Noted  . Atrial fibrillation (HCC) 03/08/2020  . Long term (current) use of anticoagulants 03/08/2020  . Paroxysmal atrial fibrillation (HCC) 01/25/2020  . Essential hypertension 01/25/2020  . Educated about COVID-19 virus infection 01/25/2020  . Atrial fibrillation with RVR (HCC) 02/16/2012    Past Surgical History:  Procedure Laterality Date  . KNEE SURGERY Left        Family History  Problem Relation Age of Onset  . Heart attack Father 9262  . Stroke Father 6262  . Deep vein thrombosis Mother   . Varicose Veins Mother     Social History   Tobacco Use  . Smoking status: Former Smoker    Packs/day: 1.00    Years: 25.00    Pack years: 25.00    Types: Cigarettes  . Smokeless tobacco: Never Used  . Tobacco comment: Off and on   Vaping Use  . Vaping Use: Never used  Substance Use Topics  . Alcohol use: Yes    Comment: occ  . Drug use: No    Home Medications Prior to Admission medications   Medication Sig Start Date End Date Taking? Authorizing Provider  diltiazem (CARTIA XT) 300 MG 24 hr capsule Take 1 capsule (300 mg total) by mouth daily. 06/28/20  Yes Rollene RotundaHochrein, James, MD   diphenhydramine-acetaminophen (TYLENOL PM) 25-500 MG TABS Take 1 tablet by mouth daily.   Yes [provider]  flecainide (TAMBOCOR) 100 MG tablet Take 1 tablet (100 mg total) by mouth 2 (two) times daily. 07/18/20  Yes Rollene RotundaHochrein, James, MD  hydrALAZINE (APRESOLINE) 10 MG tablet Take 1 tablet (10 mg total) by mouth in the morning and at bedtime. 03/28/20 03/23/21 Yes Azalee CourseMeng, Hao, PA  lansoprazole (PREVACID) 30 MG capsule Take 30 mg by mouth daily.   Yes [provider]  ramipril (ALTACE) 10 MG capsule Take 1 capsule (10 mg total) by mouth 2 (two) times daily. 01/26/20 07/24/20 Yes Rollene RotundaHochrein, James, MD  warfarin (COUMADIN) 3 MG tablet Take 1/2 to 1 tablet by mouth every evening or as directed by Coumadin clinic Patient taking differently: Take 1.5-3 mg by mouth See admin instructions. Takes 3 mg once daily except Wednesday. Takes 1.5 mg on Wednesday 09/09/20  Yes Rollene RotundaHochrein, James, MD    Allergies    Percocet [oxycodone-acetaminophen] and Vicodin [hydrocodone-acetaminophen]  Review of Systems   Review of Systems  All other systems reviewed and are negative.   Physical Exam Updated Vital Signs BP (!) 175/88   Pulse 72   Temp 97.7 F (36.5 C) (Oral)   Resp 18  SpO2 95%   Physical Exam Vitals and nursing note reviewed.  Constitutional:      Appearance: He is well-developed.  HENT:     Head: Normocephalic and atraumatic.  Eyes:     Conjunctiva/sclera: Conjunctivae normal.  Musculoskeletal:        General: Swelling present.     Comments: Bruising heel, swelling calf muscle,  nv and ns intact  Skin:    General: Skin is warm and dry.  Neurological:     General: No focal deficit present.     Mental Status: He is alert.  Psychiatric:        Mood and Affect: Mood normal.     ED Results / Procedures / Treatments   Labs (all labs ordered are listed, but only abnormal results are displayed) Labs Reviewed  BASIC METABOLIC PANEL - Abnormal; Notable for the following  components:      Result Value   Sodium 134 (*)    Glucose, Bld 110 (*)    Calcium 8.6 (*)    All other components within normal limits  PROTIME-INR - Abnormal; Notable for the following components:   Prothrombin Time 26.6 (*)    INR 2.6 (*)    All other components within normal limits  CBC    EKG EKG Interpretation  Date/Time:  Sunday January 05 2021 12:19:12 EDT Ventricular Rate:  84 PR Interval:  148 QRS Duration: 92 QT Interval:  388 QTC Calculation: 458 R Axis:   29 Text Interpretation: Normal sinus rhythm Normal ECG sinus replacing afib on prior 3/21 Confirmed by Meridee Score [phone]) on 01/05/2021 1:19:37 PM   Radiology VAS [place] LOWER EXTREMITY VENOUS (DVT) (ONLY [facility])  Result Date: 01/05/2021  Lower Venous DVT Study Indications: Calf tightness.  Limitations: Musculature of calf. Comparison Study: No prior study on file Performing Technologist: Sherren Kerns RVS  Examination Guidelines: A complete evaluation includes B-mode imaging, spectral Doppler, color Doppler, and power Doppler as needed of all accessible portions of each vessel. Bilateral testing is considered an integral part of a complete examination. Limited examinations for reoccurring indications may be performed as noted. The reflux portion of the exam is performed with the patient in reverse Trendelenburg.  +---------+---------------+---------+-----------+----------+-------------------+ RIGHT    CompressibilityPhasicitySpontaneityPropertiesThrombus Aging      +---------+---------------+---------+-----------+----------+-------------------+ CFV      Full                                                             +---------+---------------+---------+-----------+----------+-------------------+ SFJ      Full                                                             +---------+---------------+---------+-----------+----------+-------------------+ FV Prox  Full                                                              +---------+---------------+---------+-----------+----------+-------------------+ FV Mid   Full                                                             +---------+---------------+---------+-----------+----------+-------------------+  FV DistalFull                                                             +---------+---------------+---------+-----------+----------+-------------------+ PFV      Full                                                             +---------+---------------+---------+-----------+----------+-------------------+ POP      Full                                                             +---------+---------------+---------+-----------+----------+-------------------+ PTV      Full                                                             +---------+---------------+---------+-----------+----------+-------------------+ PERO                                                  Not well visualized +---------+---------------+---------+-----------+----------+-------------------+ Gastroc  Full                                                             +---------+---------------+---------+-----------+----------+-------------------+ GSV      Full                                                             +---------+---------------+---------+-----------+----------+-------------------+   Left Technical Findings: Left leg not evaluated.   Summary: RIGHT: - There is no evidence of deep vein thrombosis in the lower extremity.  - No cystic structure found in the popliteal fossa. - Area of mixed echoes noted in the proximal calf as well as a hypoechoic area noted medial knee, etiology unknown. Interstitial fluid noted throughout the calf.   *See table(s) above for measurements and observations.    Preliminary     Procedures Procedures   Medications Ordered in ED Medications - No data to display  ED Course  I have  reviewed the triage vital signs and the nursing notes.  Pertinent labs & imaging results that were available during my care of the patient were reviewed by me and considered in my medical decision making (see chart for details).    MDM Rules/Calculators/A&P  MDM:  Ultrasound shows no evidence of dvt,  Pt appears to have had bleeding from vein or possible muscle  INR is 2.6.   Ace wrap placed.  Pt advised to follow up with his MD for recheck  Final Clinical Impression(s) / ED Diagnoses Final diagnoses:  Hematoma of right lower leg    Rx / DC Orders ED Discharge Orders    None    An After Visit Summary was printed and given to the patient.    Osie Cheeks 01/05/21 1518    Terrilee Files, MD 01/05/21 [phone]

## 2021-01-05 NOTE — ED Triage Notes (Signed)
Right leg pain started Wednesday.  No known injury.  Patient is very specific about pain medial right knee, continuing down right lower leg.  Visible swelling from knee to and including ankle and foot is swollen.  Bruising discoloration present below right ankle.  Lower leg is painful, tingling "weird" feeling.  1+right pedal pulse.  Able to wiggle toes, brisk cap refill

## 2021-01-05 NOTE — ED Provider Notes (Signed)
[facility]    CSN: [id number] Arrival date & time: 01/05/21  1038      History   Chief Complaint Chief Complaint  Patient presents with  . Leg Pain    HPI Scott Townsend is a 65 y.o. male.   HPI  Right Calf Pain: Pt states that he started having right leg pain starting on Wednesday.  No known injury.  Patient is very specific about pain medial right knee, continuing down right lower leg with calf pain. Worse with walking or movement of the foot upwards.  Visible swelling from knee to and including ankle and foot is swollen.  Bruising discoloration present below right ankle along with varicose veins.  Lower leg is painful, tingling "weird" feeling.  Able to wiggle toes, brisk cap refill. He has not tried anything for symptoms. Of note he is on coumadin for A. Fib. No chest pains, SOB or pain of the left calf.   Past Medical History:  Diagnosis Date  . Atrial fibrillation   . Atrial fibrillation (HCC)   . Barrett esophagus   . Hypertension     Patient Active Problem List   Diagnosis Date Noted  . Atrial fibrillation (HCC) 03/08/2020  . Long term (current) use of anticoagulants 03/08/2020  . Paroxysmal atrial fibrillation (HCC) 01/25/2020  . Essential hypertension 01/25/2020  . Educated about COVID-19 virus infection 01/25/2020  . Atrial fibrillation with RVR (HCC) 02/16/2012    Past Surgical History:  Procedure Laterality Date  . KNEE SURGERY Left        Home Medications    Prior to Admission medications   Medication Sig Start Date End Date Taking? Authorizing Provider  diltiazem (CARTIA XT) 300 MG 24 hr capsule Take 1 capsule (300 mg total) by mouth daily. 06/28/20  Yes Rollene Rotunda, MD  flecainide (TAMBOCOR) 100 MG tablet Take 1 tablet (100 mg total) by mouth 2 (two) times daily. 07/18/20  Yes Rollene Rotunda, MD  hydrALAZINE (APRESOLINE) 10 MG tablet Take 1 tablet (10 mg total) by mouth in the morning and at bedtime. 03/28/20 03/23/21 Yes Azalee Course, PA  lansoprazole (PREVACID) 30 MG capsule Take 30 mg by mouth daily.   Yes [provider]  warfarin (COUMADIN) 3 MG tablet Take 1/2 to 1 tablet by mouth every evening or as directed by Coumadin clinic 09/09/20  Yes Rollene Rotunda, MD  diphenhydramine-acetaminophen (TYLENOL PM) 25-500 MG TABS Take 1 tablet by mouth at bedtime as needed.    [provider]  ramipril (ALTACE) 10 MG capsule Take 1 capsule (10 mg total) by mouth 2 (two) times daily. 01/26/20 07/24/20  Rollene Rotunda, MD    Family History Family History  Problem Relation Age of Onset  . Heart attack Father 75  . Stroke Father 73  . Deep vein thrombosis Mother   . Varicose Veins Mother     Social History Social History   Tobacco Use  . Smoking status: Former Smoker    Packs/day: 1.00    Years: 25.00    Pack years: 25.00    Types: Cigarettes  . Smokeless tobacco: Never Used  . Tobacco comment: Off and on   Vaping Use  . Vaping Use: Never used  Substance Use Topics  . Alcohol use: Yes    Comment: occ  . Drug use: No     Allergies   Percocet [oxycodone-acetaminophen] and Vicodin [hydrocodone-acetaminophen]   Review of Systems Review of Systems  As stated above in HPI Physical Exam  Triage Vital Signs ED Triage Vitals  Enc Vitals Group     BP 01/05/21 1149 (!) 164/83     Pulse Rate 01/05/21 1149 78     Resp 01/05/21 1149 18     Temp 01/05/21 1149 97.8 F (36.6 C)     Temp Source 01/05/21 1149 Oral     SpO2 01/05/21 1149 97 %     Weight --      Height --      Head Circumference --      Peak Flow --      Pain Score 01/05/21 1145 6     Pain Loc --      Pain Edu? --      Excl. in GC? --    No data found.  Updated Vital Signs BP (!) 164/83 (BP Location: Right Arm)   Pulse 78   Temp 97.8 F (36.6 C) (Oral)   Resp 18   SpO2 97%   Physical Exam Vitals and nursing note reviewed.  Constitutional:      General: He is not in acute distress.    Appearance: Normal appearance.  He is not ill-appearing, toxic-appearing or diaphoretic.  HENT:     Head: Normocephalic and atraumatic.  Cardiovascular:     Rate and Rhythm: Normal rate and regular rhythm.     Heart sounds: Normal heart sounds.     Comments: Pedel pulses 1+ bilaterally  Pulmonary:     Breath sounds: Normal breath sounds.  Musculoskeletal:        General: Swelling and tenderness present.     Comments: Left calf circumference is 17 inches. Right calf is 18.25 inches. Positive Homans sign of the right   Skin:    Capillary Refill: Capillary refill takes less than 2 seconds.     Findings: Bruising (right lower leg) and lesion (new vericosities visulized per pt) present.  Neurological:     General: No focal deficit present.     Mental Status: He is alert.      UC Treatments / Results  Labs (all labs ordered are listed, but only abnormal results are displayed) Labs Reviewed - No data to display  EKG   Radiology No results found.  Procedures Procedures (including critical care time)  Medications Ordered in UC Medications - No data to display  Initial Impression / Assessment and Plan / UC Course  I have reviewed the triage vital signs and the nursing notes.  Pertinent labs & imaging results that were available during my care of the patient were reviewed by me and considered in my medical decision making (see chart for details).     New.  Significant concern for DVT.  I have discussed this with patient.  Given that this finding can cause further cardiovascular injury I have recommended that he be evaluated in the emergency room as he will need further imaging and adjustment of his anticoagulant medication.  Discussed red flag signs and symptoms.  He prefers private transportation to the emergency room.  Final Clinical Impressions(s) / UC Diagnoses   Final diagnoses:  None   Discharge Instructions   None    ED Prescriptions    None     PDMP not reviewed this encounter.    Rushie Chestnut, [place] 01/05/21 1204

## 2021-01-05 NOTE — ED Triage Notes (Addendum)
Pt sent from [facility].  Reports R medial knee pain since Wednesday that radiates down R leg.  No known injury.  States it has been warm to touch with swelling.  Bruising since last night to R lower leg and R ankle.  States it is tingling.  CMS intact.  Takes Coumadin.

## 2021-01-06 ENCOUNTER — Telehealth: Payer: Self-pay

## 2021-01-06 NOTE — Telephone Encounter (Signed)
lmom to reschedule coumadin appt since they just had it done no need to come again so soon and would like to postpone it for another 6 wk

## 2021-01-06 NOTE — Telephone Encounter (Signed)
Can you call him and arrange his next INR appointment in 6 weeks.  INR was good at [facility], he should continue with the same dose

## 2021-02-07 ENCOUNTER — Other Ambulatory Visit: Payer: Self-pay

## 2021-02-10 MED ORDER — RAMIPRIL 10 MG PO CAPS
10.0000 mg | ORAL_CAPSULE | Freq: Two times a day (BID) | ORAL | 1 refills | Status: DC
Start: 2021-02-10 — End: 2021-08-18

## 2021-02-10 NOTE — Telephone Encounter (Signed)
Rx(s) sent to pharmacy electronically.  

## 2021-02-17 ENCOUNTER — Other Ambulatory Visit: Payer: Self-pay

## 2021-02-17 ENCOUNTER — Ambulatory Visit (INDEPENDENT_AMBULATORY_CARE_PROVIDER_SITE_OTHER): Payer: Self-pay

## 2021-02-17 DIAGNOSIS — Z7901 Long term (current) use of anticoagulants: Secondary | ICD-10-CM

## 2021-02-17 DIAGNOSIS — Z5181 Encounter for therapeutic drug level monitoring: Secondary | ICD-10-CM

## 2021-02-17 DIAGNOSIS — I4891 Unspecified atrial fibrillation: Secondary | ICD-10-CM

## 2021-02-17 DIAGNOSIS — I480 Paroxysmal atrial fibrillation: Secondary | ICD-10-CM

## 2021-02-17 LAB — POCT INR: INR: 2.2 (ref 2.0–3.0)

## 2021-02-17 NOTE — Patient Instructions (Signed)
Continue taking 1 tablet daily except 1/2 tablet on Wednesday.  Repeat INR in 6 weeks. 

## 2021-03-27 ENCOUNTER — Other Ambulatory Visit: Payer: Self-pay

## 2021-03-31 ENCOUNTER — Other Ambulatory Visit: Payer: Self-pay

## 2021-03-31 ENCOUNTER — Ambulatory Visit (INDEPENDENT_AMBULATORY_CARE_PROVIDER_SITE_OTHER): Payer: Self-pay

## 2021-03-31 DIAGNOSIS — I480 Paroxysmal atrial fibrillation: Secondary | ICD-10-CM

## 2021-03-31 DIAGNOSIS — Z5181 Encounter for therapeutic drug level monitoring: Secondary | ICD-10-CM

## 2021-03-31 DIAGNOSIS — I4891 Unspecified atrial fibrillation: Secondary | ICD-10-CM

## 2021-03-31 DIAGNOSIS — Z7901 Long term (current) use of anticoagulants: Secondary | ICD-10-CM

## 2021-03-31 LAB — POCT INR: INR: 2.6 (ref 2.0–3.0)

## 2021-03-31 NOTE — Patient Instructions (Signed)
Continue taking 1 tablet daily except 1/2 tablet on Wednesday.  Repeat INR in 6 weeks. 

## 2021-05-12 ENCOUNTER — Other Ambulatory Visit: Payer: Self-pay

## 2021-05-12 ENCOUNTER — Ambulatory Visit (INDEPENDENT_AMBULATORY_CARE_PROVIDER_SITE_OTHER): Payer: Self-pay

## 2021-05-12 DIAGNOSIS — Z7901 Long term (current) use of anticoagulants: Secondary | ICD-10-CM

## 2021-05-12 DIAGNOSIS — I480 Paroxysmal atrial fibrillation: Secondary | ICD-10-CM

## 2021-05-12 DIAGNOSIS — I4891 Unspecified atrial fibrillation: Secondary | ICD-10-CM

## 2021-05-12 DIAGNOSIS — Z5181 Encounter for therapeutic drug level monitoring: Secondary | ICD-10-CM

## 2021-05-12 LAB — POCT INR: INR: 2.8 (ref 2.0–3.0)

## 2021-05-12 NOTE — Patient Instructions (Signed)
Continue taking 1 tablet daily except 1/2 tablet on Wednesday.  Repeat INR in 7 weeks.

## 2021-05-13 ENCOUNTER — Other Ambulatory Visit: Payer: Self-pay | Admitting: Physician Assistant

## 2021-05-22 IMAGING — CR DG CHEST 2V
2 series · 2 of 2 positions shown · non-contrast
Comparison: 03/25/2013

CLINICAL DATA: Irregular heartbeat and atrial fibrillation

EXAM:
CHEST - 2 VIEW

[chest pa]
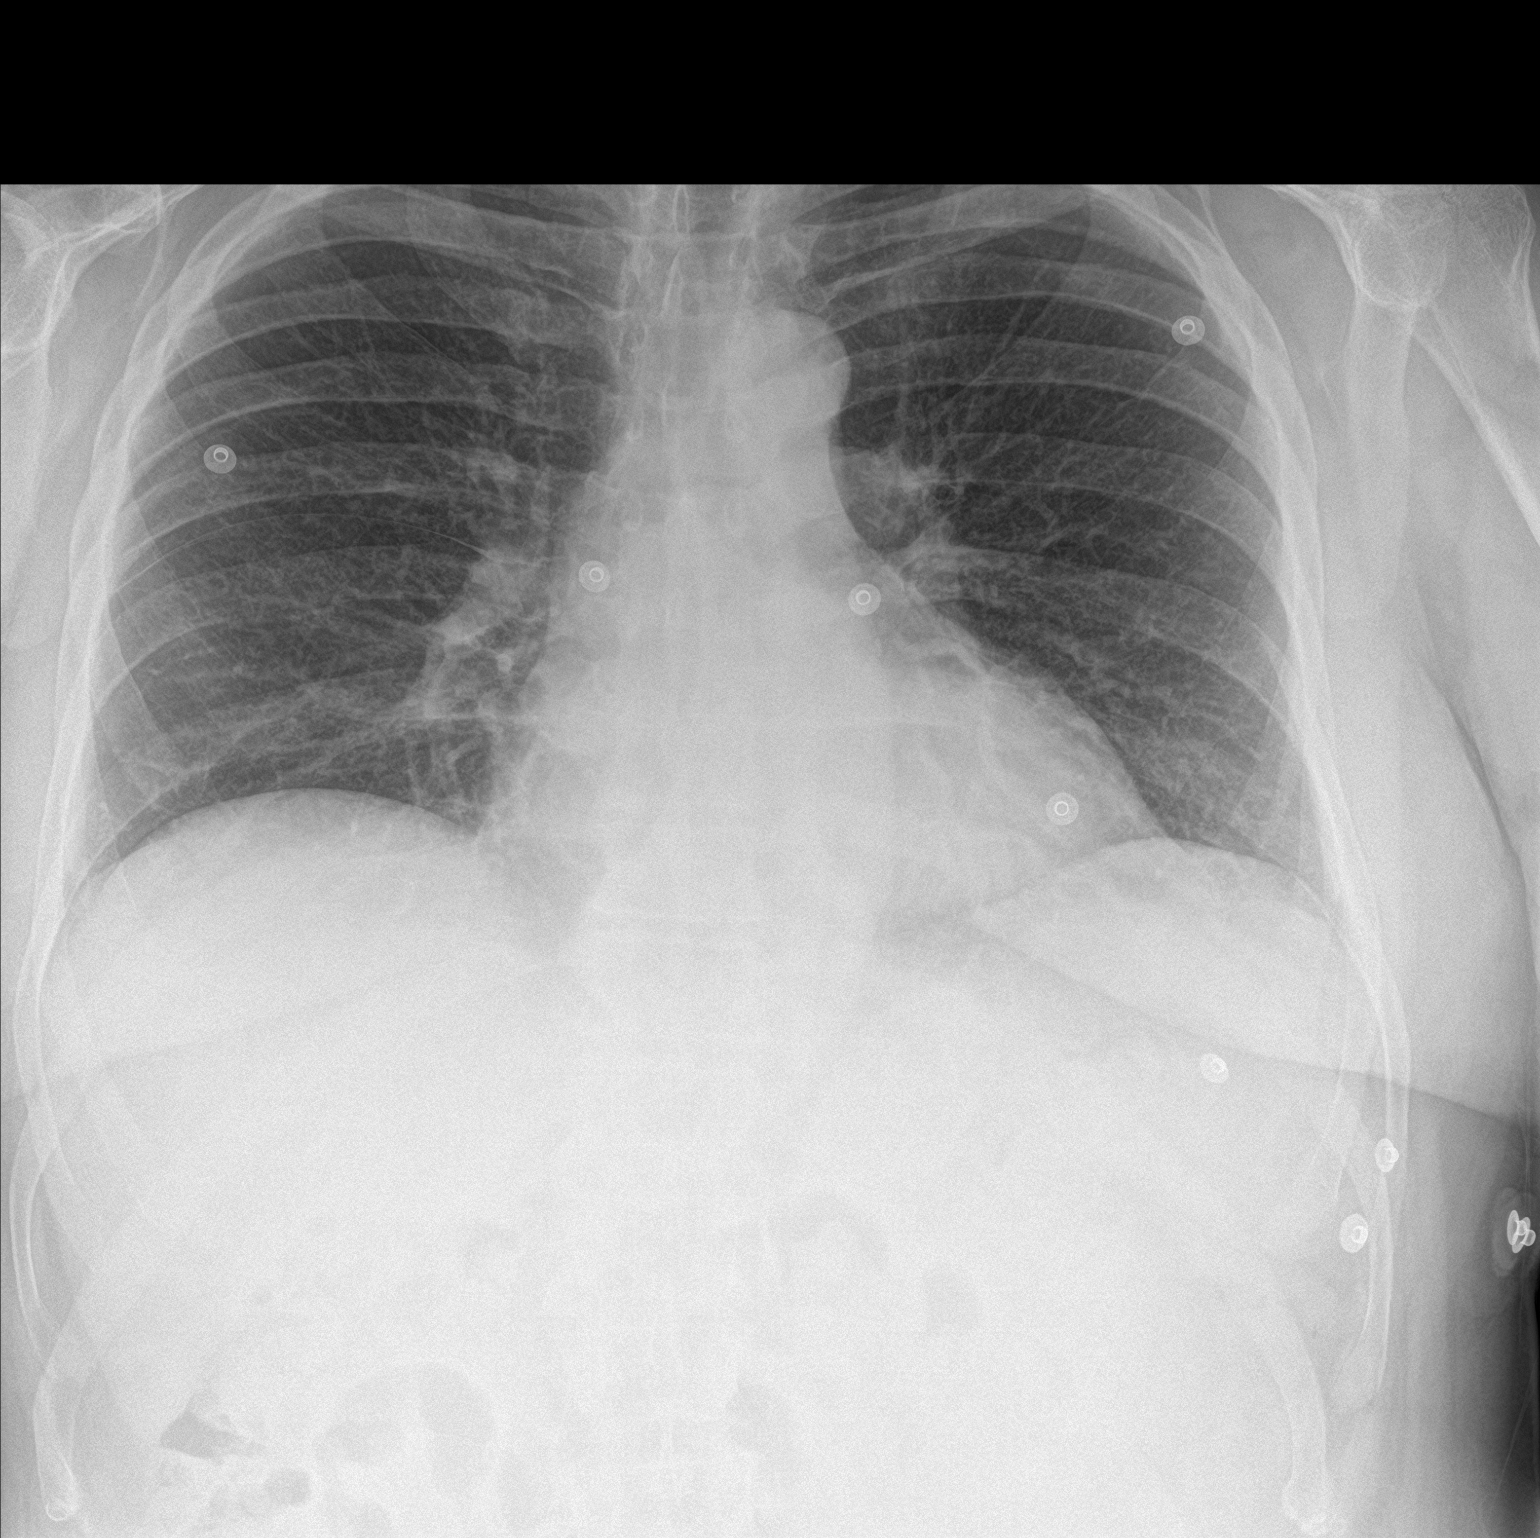

[chest lat]
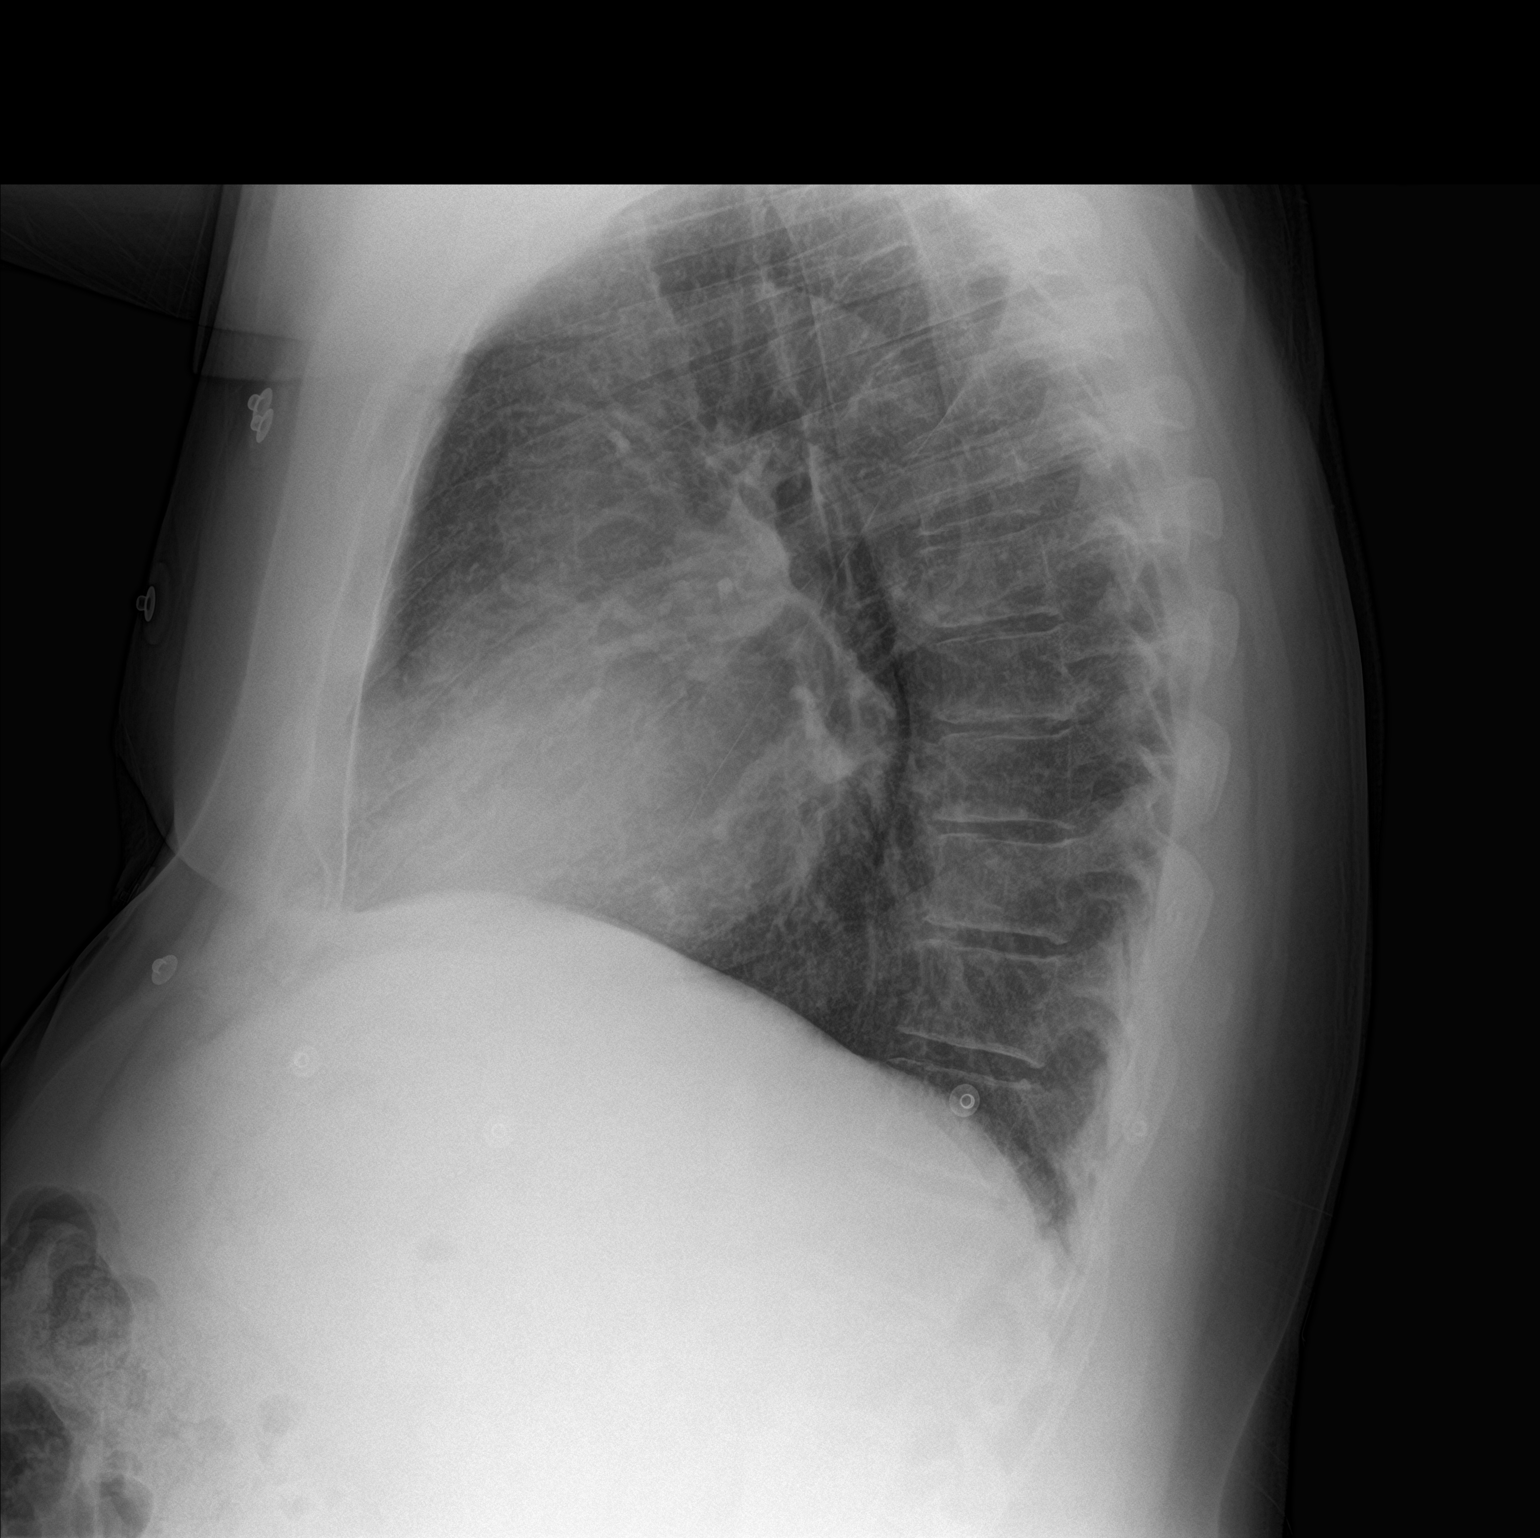

[2 of 2 positions shown; findings below may reference images not displayed]

FINDINGS: The heart size and mediastinal contours are within normal limits.
Both lungs are clear. The visualized skeletal structures are
unremarkable.
IMPRESSION: No active cardiopulmonary disease.

## 2021-06-16 ENCOUNTER — Other Ambulatory Visit: Payer: Self-pay | Admitting: Cardiology

## 2021-06-17 MED ORDER — DILTIAZEM HCL ER COATED BEADS 300 MG PO CP24: 300.0000 mg | ORAL_CAPSULE | Freq: Every day | ORAL | 0 refills | Status: DC

## 2021-06-24 NOTE — Telephone Encounter (Signed)
Lm for pt to call back and make an appt was last seen 9 of 2021 ./cy

## 2021-06-25 NOTE — Telephone Encounter (Signed)
Pt is scheduled to see Azalee Course 06/26/2021.

## 2021-06-26 ENCOUNTER — Encounter: Payer: Self-pay | Admitting: Physician Assistant

## 2021-06-26 ENCOUNTER — Ambulatory Visit (INDEPENDENT_AMBULATORY_CARE_PROVIDER_SITE_OTHER): Payer: Self-pay

## 2021-06-26 ENCOUNTER — Ambulatory Visit (INDEPENDENT_AMBULATORY_CARE_PROVIDER_SITE_OTHER): Payer: Self-pay | Admitting: Physician Assistant

## 2021-06-26 ENCOUNTER — Other Ambulatory Visit: Payer: Self-pay

## 2021-06-26 VITALS — BP 138/72 | HR 73 | Ht 70.0 in | Wt 230.4 lb

## 2021-06-26 DIAGNOSIS — I480 Paroxysmal atrial fibrillation: Secondary | ICD-10-CM

## 2021-06-26 DIAGNOSIS — I10 Essential (primary) hypertension: Secondary | ICD-10-CM

## 2021-06-26 DIAGNOSIS — Z7901 Long term (current) use of anticoagulants: Secondary | ICD-10-CM

## 2021-06-26 DIAGNOSIS — I4891 Unspecified atrial fibrillation: Secondary | ICD-10-CM

## 2021-06-26 LAB — POCT INR: INR: 3 (ref 2.0–3.0)

## 2021-06-26 MED ORDER — FLECAINIDE ACETATE 100 MG PO TABS: 100.0000 mg | ORAL_TABLET | Freq: Two times a day (BID) | ORAL | 3 refills | Status: DC

## 2021-06-26 MED ORDER — FLECAINIDE ACETATE 100 MG PO TABS: 100.0000 mg | ORAL_TABLET | Freq: Two times a day (BID) | ORAL | Status: DC

## 2021-06-26 MED ORDER — HYDRALAZINE HCL 25 MG PO TABS: 25.0000 mg | ORAL_TABLET | Freq: Two times a day (BID) | ORAL | 3 refills | Status: DC

## 2021-06-26 MED ORDER — DILTIAZEM HCL ER COATED BEADS 300 MG PO CP24: 300.0000 mg | ORAL_CAPSULE | Freq: Every day | ORAL | 3 refills | Status: DC

## 2021-06-26 NOTE — Patient Instructions (Signed)
Continue taking 1 tablet daily except 1/2 tablet on Wednesday.  Repeat INR in 7 weeks.  

## 2021-06-26 NOTE — Progress Notes (Signed)
Cardiology Office Note:    Date:  06/28/2021   ID:  Scott Townsend, DOB [birth year], MRN [id number]  PCP:  Patient, No Pcp Per (Inactive)   [facility] Providers Cardiologist:  Rollene Rotunda, MD     Referring MD: No ref. provider found   Chief Complaint  Patient presents with   Follow-up    Seen for Dr. Antoine Poche     History of Present Illness:    Scott Townsend is a 65 y.o. male with a hx of hypertension and atrial fibrillation.  He underwent multiple cardioversion since being diagnosed with atrial fibrillation in 2013.  He was started on flecainide and Xarelto.  Unfortunately due to cost reasons, he was later switched to Coumadin.  He has a history of significant dizziness and fatigue on chlorthalidone.  He was last seen by Dr. Antoine Poche on 06/28/2020 at which time he was having premature ectopic beats, Cardizem was increased to 300 mg daily.  He returns today for 52-month follow-up.  He was seen in the ED in March 2022 for leg pain.  Right lower extremity venous Doppler obtained on 01/05/2021 showed area of mixed echoes noted in the proximal calf as well as hypoechoic area noted in the medial knee, interstitial fluid noted throughout the calf.  Patient presents today for follow-up.  He denies any recent chest pain or worsening shortness of breath.  He still has occasional premature heartbeat that occurs about 3-4 times a week.  Now quite transient and usually suggestive of either PACs or PVCs.  He has not had any recurrent atrial fibrillation.  He has no lower extremity edema, orthopnea or PND.  He can follow-up in 1 year.   Past Medical History:  Diagnosis Date   Atrial fibrillation    Atrial fibrillation (HCC)    Barrett esophagus    Hypertension     Past Surgical History:  Procedure Laterality Date   KNEE SURGERY Left     Current Medications: Current Meds  Medication Sig   diphenhydramine-acetaminophen (TYLENOL PM) 25-500 MG TABS Take 1 tablet by mouth daily.    lansoprazole (PREVACID) 30 MG capsule Take 30 mg by mouth daily.   ramipril (ALTACE) 10 MG capsule Take 1 capsule (10 mg total) by mouth 2 (two) times daily.   warfarin (COUMADIN) 3 MG tablet Take 1/2 to 1 tablet by mouth every evening or as directed by Coumadin clinic (Patient taking differently: Take 1.5-3 mg by mouth See admin instructions. Takes 3 mg once daily except Wednesday. Takes 1.5 mg on Wednesday)   [DISCONTINUED] diltiazem (CARDIZEM CD) 300 MG 24 hr capsule Take 1 capsule (300 mg total) by mouth daily. Keep upcoming appointment for future refills   [DISCONTINUED] flecainide (TAMBOCOR) 100 MG tablet Take 1 tablet (100 mg total) by mouth 2 (two) times daily.   [DISCONTINUED] hydrALAZINE (APRESOLINE) 10 MG tablet TAKE ONE TABLET BY MOUTH EVERY MORNING AND AT BEDTIME     Allergies:   Percocet [oxycodone-acetaminophen] and Vicodin [hydrocodone-acetaminophen]   Social History   Socioeconomic History   Marital status: Married    Spouse name: Not on file   Number of children: 3   Years of education: Not on file   Highest education level: Not on file  Occupational History    Employer: [employer]  Tobacco Use   Smoking status: Former    Packs/day: 1.00    Years: 25.00    Pack years: 25.00    Types: Cigarettes   Smokeless tobacco: Never  Tobacco comments:    Off and on   Vaping Use   Vaping Use: Never used  Substance and Sexual Activity   Alcohol use: Yes    Comment: occ   Drug use: No   Sexual activity: Not on file  Other Topics Concern   Not on file  Social History Narrative   ** Merged History Encounter **       Works for [employer]   Social Determinants of Corporate investment banker Strain: Not on file  Food Insecurity: Not on file  Transportation Needs: Not on file  Physical Activity: Not on file  Stress: Not on file  Social Connections: Not on file     Family History: The patient's family history includes Deep vein thrombosis in his mother;  Heart attack (age of onset: 32) in his father; Stroke (age of onset: 25) in his father; Varicose Veins in his mother.  ROS:   Please see the history of present illness.     All other systems reviewed and are negative.  EKGs/Labs/Other Studies Reviewed:    The following studies were reviewed today:  ETT 06/27/2014 no ischemic changes  EKG:  EKG is not ordered today.  Last EKG from 01/05/2021 was reviewed  Recent Labs: 01/05/2021: BUN 12; Creatinine, Ser 0.95; Hemoglobin 15.0; Platelets 239; Potassium 3.5; Sodium 134  Recent Lipid Panel    Component Value Date/Time   CHOL 160 01/04/2017 1304   TRIG 86 01/04/2017 1304   HDL 61 01/04/2017 1304   CHOLHDL 2.6 01/04/2017 1304   VLDL 17 01/04/2017 1304   LDLCALC 82 01/04/2017 1304     Risk Assessment/Calculations:    CHA2DS2-VASc Score = 1   This indicates a 0.6% annual risk of stroke. The patient's score is based upon: CHF History: 0 HTN History: 1 Diabetes History: 0 Stroke History: 0 Vascular Disease History: 0 Age Score: 0 Gender Score: 0          Physical Exam:    VS:  BP 138/72   Pulse 73   Ht 5\' 10"  (1.778 m)   Wt 230 lb 6.4 oz (104.5 kg)   SpO2 97%   BMI 33.06 kg/m     Wt Readings from Last 3 Encounters:  06/26/21 230 lb 6.4 oz (104.5 kg)  06/28/20 232 lb (105.2 kg)  04/02/20 225 lb (102.1 kg)     GEN:  Well nourished, well developed in no acute distress HEENT: Normal NECK: No JVD; No carotid bruits LYMPHATICS: No lymphadenopathy CARDIAC: RRR, no murmurs, rubs, gallops RESPIRATORY:  Clear to auscultation without rales, wheezing or rhonchi  ABDOMEN: Soft, non-tender, non-distended MUSCULOSKELETAL:  No edema; No deformity  SKIN: Warm and dry NEUROLOGIC:  Alert and oriented x 3 PSYCHIATRIC:  Normal affect   ASSESSMENT:    1. PAF (paroxysmal atrial fibrillation) (HCC)   2. Primary hypertension    PLAN:    In order of problems listed above:  Paroxysmal atrial fibrillation: No recent recurrence.   Continue diltiazem, flecainide and Coumadin therapy.  Last EKG obtained in March 2022 which showed a sinus rhythm.  Heart rate regular on physical exam  Hypertension: Blood pressure borderline elevated in the office today, will increase hydralazine to 25 mg twice a day.         Medication Adjustments/Labs and Tests Ordered: Current medicines are reviewed at length with the patient today.  Concerns regarding medicines are outlined above.  No orders of the defined types were placed in this encounter.  Meds  ordered this encounter  Medications   hydrALAZINE (APRESOLINE) 25 MG tablet    Sig: Take 1 tablet (25 mg total) by mouth in the morning and at bedtime.    Dispense:  180 tablet    Refill:  3   diltiazem (CARDIZEM CD) 300 MG 24 hr capsule    Sig: Take 1 capsule (300 mg total) by mouth daily. Keep upcoming appointment for future refills    Dispense:  90 capsule    Refill:  3   DISCONTD: flecainide (TAMBOCOR) 100 MG tablet    Sig: Take 1 tablet (100 mg total) by mouth 2 (two) times daily.    Dispense:  180 tablet    Refill:  .   flecainide (TAMBOCOR) 100 MG tablet    Sig: Take 1 tablet (100 mg total) by mouth 2 (two) times daily.    Dispense:  180 tablet    Refill:  3    Patient Instructions  Medication Instructions:  INCREASE Hydralazine to 25 mg 2 times a day  *If you need a refill on your cardiac medications before your next appointment, please call your pharmacy*  Lab Work: NONE ordered at this time of appointment   If you have labs (blood work) drawn today and your tests are completely normal, you will receive your results only by: MyChart Message (if you have MyChart) OR A paper copy in the mail If you have any lab test that is abnormal or we need to change your treatment, we will call you to review the results.  Testing/Procedures: NONE ordered at this time of appointment   Follow-Up: At [facility], you and your health needs are our priority.  As part of our  continuing mission to provide you with exceptional heart care, we have created designated Provider Care Teams.  These Care Teams include your primary Cardiologist (physician) and Advanced Practice Providers (APPs -  Physician Assistants and Nurse Practitioners) who all work together to provide you with the care you need, when you need it.  Your next appointment:   1 year(s)  The format for your next appointment:   In Person  Provider:   Rollene Rotunda, MD  Other Instructions    Signed, Azalee Course, PA  06/28/2021 10:04 PM    [facility]

## 2021-06-26 NOTE — Patient Instructions (Signed)
Medication Instructions:  INCREASE Hydralazine to 25 mg 2 times a day  *If you need a refill on your cardiac medications before your next appointment, please call your pharmacy*  Lab Work: NONE ordered at this time of appointment   If you have labs (blood work) drawn today and your tests are completely normal, you will receive your results only by: MyChart Message (if you have MyChart) OR A paper copy in the mail If you have any lab test that is abnormal or we need to change your treatment, we will call you to review the results.  Testing/Procedures: NONE ordered at this time of appointment   Follow-Up: At [facility], you and your health needs are our priority.  As part of our continuing mission to provide you with exceptional heart care, we have created designated Provider Care Teams.  These Care Teams include your primary Cardiologist (physician) and Advanced Practice Providers (APPs -  Physician Assistants and Nurse Practitioners) who all work together to provide you with the care you need, when you need it.  Your next appointment:   1 year(s)  The format for your next appointment:   In Person  Provider:   Rollene Rotunda, MD  Other Instructions

## 2021-06-28 ENCOUNTER — Encounter: Payer: Self-pay | Admitting: Physician Assistant

## 2021-08-14 ENCOUNTER — Other Ambulatory Visit: Payer: Self-pay

## 2021-08-14 ENCOUNTER — Ambulatory Visit (INDEPENDENT_AMBULATORY_CARE_PROVIDER_SITE_OTHER): Payer: Medicare Other

## 2021-08-14 DIAGNOSIS — Z5181 Encounter for therapeutic drug level monitoring: Secondary | ICD-10-CM | POA: Diagnosis not present

## 2021-08-14 DIAGNOSIS — I4891 Unspecified atrial fibrillation: Secondary | ICD-10-CM

## 2021-08-14 DIAGNOSIS — Z7901 Long term (current) use of anticoagulants: Secondary | ICD-10-CM | POA: Diagnosis not present

## 2021-08-14 DIAGNOSIS — I480 Paroxysmal atrial fibrillation: Secondary | ICD-10-CM

## 2021-08-14 LAB — POCT INR: INR: 3.3 — AB (ref 2.0–3.0)

## 2021-08-14 NOTE — Patient Instructions (Signed)
Continue taking 1 tablet daily except 1/2 tablet on Wednesday.  Repeat INR in 4 weeks.

## 2021-08-16 ENCOUNTER — Other Ambulatory Visit: Payer: Self-pay | Admitting: Cardiology

## 2021-08-16 ENCOUNTER — Other Ambulatory Visit: Payer: Self-pay

## 2021-09-08 ENCOUNTER — Ambulatory Visit (INDEPENDENT_AMBULATORY_CARE_PROVIDER_SITE_OTHER): Payer: Medicare Other

## 2021-09-08 ENCOUNTER — Other Ambulatory Visit: Payer: Self-pay

## 2021-09-08 DIAGNOSIS — I4891 Unspecified atrial fibrillation: Secondary | ICD-10-CM | POA: Diagnosis not present

## 2021-09-08 DIAGNOSIS — Z5181 Encounter for therapeutic drug level monitoring: Secondary | ICD-10-CM | POA: Diagnosis not present

## 2021-09-08 DIAGNOSIS — Z7901 Long term (current) use of anticoagulants: Secondary | ICD-10-CM | POA: Diagnosis not present

## 2021-09-08 DIAGNOSIS — I480 Paroxysmal atrial fibrillation: Secondary | ICD-10-CM

## 2021-09-08 LAB — POCT INR: INR: 2.5 (ref 2.0–3.0)

## 2021-09-08 NOTE — Patient Instructions (Signed)
Continue taking 1 tablet daily except 1/2 tablet on Wednesday.  Repeat INR in 6 weeks.

## 2021-10-05 ENCOUNTER — Other Ambulatory Visit: Payer: Self-pay | Admitting: Cardiology

## 2021-10-05 DIAGNOSIS — I4891 Unspecified atrial fibrillation: Secondary | ICD-10-CM

## 2021-10-05 DIAGNOSIS — Z7901 Long term (current) use of anticoagulants: Secondary | ICD-10-CM

## 2021-10-07 ENCOUNTER — Telehealth: Payer: Self-pay

## 2021-10-07 NOTE — Telephone Encounter (Signed)
Lmom to r/s °

## 2021-10-14 NOTE — Telephone Encounter (Signed)
Lmom to r/s coumadin appt  °

## 2021-10-27 ENCOUNTER — Ambulatory Visit (INDEPENDENT_AMBULATORY_CARE_PROVIDER_SITE_OTHER): Payer: Medicare Other

## 2021-10-27 ENCOUNTER — Other Ambulatory Visit: Payer: Self-pay

## 2021-10-27 DIAGNOSIS — I4891 Unspecified atrial fibrillation: Secondary | ICD-10-CM

## 2021-10-27 DIAGNOSIS — Z7901 Long term (current) use of anticoagulants: Secondary | ICD-10-CM | POA: Diagnosis not present

## 2021-10-27 DIAGNOSIS — Z5181 Encounter for therapeutic drug level monitoring: Secondary | ICD-10-CM

## 2021-10-27 DIAGNOSIS — I480 Paroxysmal atrial fibrillation: Secondary | ICD-10-CM

## 2021-10-27 LAB — POCT INR: INR: 2.6 (ref 2.0–3.0)

## 2021-10-27 NOTE — Patient Instructions (Signed)
Continue taking 1 tablet daily except 1/2 tablet on Wednesday.  Repeat INR in 6 weeks. 

## 2021-11-06 ENCOUNTER — Encounter: Payer: Self-pay | Admitting: Cardiology

## 2021-12-08 ENCOUNTER — Ambulatory Visit (INDEPENDENT_AMBULATORY_CARE_PROVIDER_SITE_OTHER): Payer: Medicare Other

## 2021-12-08 ENCOUNTER — Other Ambulatory Visit: Payer: Self-pay

## 2021-12-08 DIAGNOSIS — I480 Paroxysmal atrial fibrillation: Secondary | ICD-10-CM

## 2021-12-08 DIAGNOSIS — I4891 Unspecified atrial fibrillation: Secondary | ICD-10-CM | POA: Diagnosis not present

## 2021-12-08 DIAGNOSIS — Z5181 Encounter for therapeutic drug level monitoring: Secondary | ICD-10-CM

## 2021-12-08 DIAGNOSIS — Z7901 Long term (current) use of anticoagulants: Secondary | ICD-10-CM

## 2021-12-08 LAB — POCT INR: INR: 2.2 (ref 2.0–3.0)

## 2021-12-08 NOTE — Patient Instructions (Signed)
Continue taking 1 tablet daily except 1/2 tablet on Wednesday.  Repeat INR in 6 weeks. 

## 2022-01-19 ENCOUNTER — Ambulatory Visit (INDEPENDENT_AMBULATORY_CARE_PROVIDER_SITE_OTHER): Payer: Medicare Other

## 2022-01-19 DIAGNOSIS — I4891 Unspecified atrial fibrillation: Secondary | ICD-10-CM

## 2022-01-19 DIAGNOSIS — Z5181 Encounter for therapeutic drug level monitoring: Secondary | ICD-10-CM | POA: Diagnosis not present

## 2022-01-19 DIAGNOSIS — Z7901 Long term (current) use of anticoagulants: Secondary | ICD-10-CM | POA: Diagnosis not present

## 2022-01-19 DIAGNOSIS — I480 Paroxysmal atrial fibrillation: Secondary | ICD-10-CM

## 2022-01-19 LAB — POCT INR: INR: 2.3 (ref 2.0–3.0)

## 2022-01-19 NOTE — Patient Instructions (Signed)
Continue taking 1 tablet daily except 1/2 tablet on Wednesday.  Repeat INR in 6 weeks. 

## 2022-02-07 ENCOUNTER — Other Ambulatory Visit: Payer: Self-pay | Admitting: Cardiology

## 2022-02-21 ENCOUNTER — Other Ambulatory Visit: Payer: Self-pay | Admitting: Cardiology

## 2022-02-21 DIAGNOSIS — I4891 Unspecified atrial fibrillation: Secondary | ICD-10-CM

## 2022-02-21 DIAGNOSIS — Z7901 Long term (current) use of anticoagulants: Secondary | ICD-10-CM

## 2022-03-02 ENCOUNTER — Ambulatory Visit (INDEPENDENT_AMBULATORY_CARE_PROVIDER_SITE_OTHER): Payer: Medicare Other

## 2022-03-02 DIAGNOSIS — Z5181 Encounter for therapeutic drug level monitoring: Secondary | ICD-10-CM

## 2022-03-02 DIAGNOSIS — I480 Paroxysmal atrial fibrillation: Secondary | ICD-10-CM

## 2022-03-02 DIAGNOSIS — I4891 Unspecified atrial fibrillation: Secondary | ICD-10-CM | POA: Diagnosis not present

## 2022-03-02 DIAGNOSIS — Z7901 Long term (current) use of anticoagulants: Secondary | ICD-10-CM | POA: Diagnosis not present

## 2022-03-02 LAB — POCT INR: INR: 2.4 (ref 2.0–3.0)

## 2022-03-02 NOTE — Patient Instructions (Signed)
Continue taking 1 tablet daily except 1/2 tablet on Wednesday.  Repeat INR in 6 weeks. 

## 2022-03-07 ENCOUNTER — Encounter: Payer: Self-pay | Admitting: Cardiology

## 2022-03-12 ENCOUNTER — Encounter: Payer: Self-pay | Admitting: Cardiology

## 2022-03-27 ENCOUNTER — Ambulatory Visit (HOSPITAL_COMMUNITY)
Admission: RE | Admit: 2022-03-27 | Discharge: 2022-03-27 | Disposition: A | Discharge status: Home / Self Care | Payer: Medicare Other | Source: Ambulatory Visit | Attending: Physician Assistant | Admitting: Physician Assistant

## 2022-03-27 VITALS — BP 166/76 | HR 61 | Ht 70.0 in | Wt 228.4 lb

## 2022-03-27 DIAGNOSIS — E669 Obesity, unspecified: Secondary | ICD-10-CM | POA: Insufficient documentation

## 2022-03-27 DIAGNOSIS — Z7901 Long term (current) use of anticoagulants: Secondary | ICD-10-CM | POA: Diagnosis not present

## 2022-03-27 DIAGNOSIS — D6869 Other thrombophilia: Secondary | ICD-10-CM | POA: Insufficient documentation

## 2022-03-27 DIAGNOSIS — I10 Essential (primary) hypertension: Secondary | ICD-10-CM | POA: Diagnosis not present

## 2022-03-27 DIAGNOSIS — Z6832 Body mass index (BMI) 32.0-32.9, adult: Secondary | ICD-10-CM | POA: Insufficient documentation

## 2022-03-27 DIAGNOSIS — I480 Paroxysmal atrial fibrillation: Secondary | ICD-10-CM | POA: Diagnosis present

## 2022-03-27 DIAGNOSIS — Z79899 Other long term (current) drug therapy: Secondary | ICD-10-CM | POA: Diagnosis not present

## 2022-03-27 NOTE — Progress Notes (Signed)
Primary Care Physician: Patient, No Pcp Per (Inactive) Primary Cardiologist: Dr Percival Spanish Primary Electrophysiologist: none Referring Physician: Dr Ellwood Handler II is a 66 y.o. male with a history of HTN and atrial fibrillation who presents for consultation in the [facility].  The patient was initially diagnosed with atrial fibrillation in 2013 and has been maintained on flecainide. Patient is on warfarin for a CHADS2VASC score of 2. Patient has done very well on flecainide until the last several months. He has had multiple afib episodes in January and April. On May 25th he had a prolonged symptomatic episode and took an extra dose of flecainide which eventually converted him. There are no specific triggers that he can identify.   Today, he denies symptoms of palpitations, chest pain, shortness of breath, orthopnea, PND, lower extremity edema, dizziness, presyncope, syncope, snoring, daytime somnolence, bleeding, or neurologic sequela. The patient is tolerating medications without difficulties and is otherwise without complaint today.    Atrial Fibrillation Risk Factors:  he does not have symptoms or diagnosis of sleep apnea. he does not have a history of rheumatic fever. he does not have a history of alcohol use. The patient does not have a history of early familial atrial fibrillation or other arrhythmias.  he has a BMI of Body mass index is 32.77 kg/m.Marland Kitchen Filed Weights   03/27/22 0826  Weight: 103.6 kg    Family History  Problem Relation Age of Onset   Heart attack Father 50   Stroke Father 27   Deep vein thrombosis Mother    Varicose Veins Mother      Atrial Fibrillation Management history:  Previous antiarrhythmic drugs: flecainide  Previous cardioversions: x 3 remotely  Previous ablations: none CHADS2VASC score: 2 Anticoagulation history: Xarelto, warfarin    Past Medical History:  Diagnosis Date   Atrial fibrillation     Atrial fibrillation (HCC)    Barrett esophagus    Hypertension    Past Surgical History:  Procedure Laterality Date   KNEE SURGERY Left     Current Outpatient Medications  Medication Sig Dispense Refill   diltiazem (CARDIZEM CD) 300 MG 24 hr capsule Take 1 capsule (300 mg total) by mouth daily. Keep upcoming appointment for future refills 90 capsule 3   diphenhydramine-acetaminophen (TYLENOL PM) 25-500 MG TABS Take 1 tablet by mouth daily.     flecainide (TAMBOCOR) 100 MG tablet Take 1 tablet (100 mg total) by mouth 2 (two) times daily. 180 tablet 3   hydrALAZINE (APRESOLINE) 25 MG tablet Take 1 tablet (25 mg total) by mouth in the morning and at bedtime. 180 tablet 3   lansoprazole (PREVACID) 30 MG capsule Take 30 mg by mouth daily.     ramipril (ALTACE) 10 MG capsule TAKE ONE CAPSULE BY MOUTH TWICE A DAY 180 capsule 3   warfarin (COUMADIN) 3 MG tablet TAKE 1 TABLET BY MOUTH EVERY  EVENING OR AS DIRECTED BY COUMADIN CLINIC 60 tablet 1   No current facility-administered medications for this encounter.    Allergies  Allergen Reactions   Percocet [Oxycodone-Acetaminophen] Nausea And Vomiting   Vicodin [Hydrocodone-Acetaminophen] Nausea Only    Social History   Socioeconomic History   Marital status: Married    Spouse name: Not on file   Number of children: 3   Years of education: Not on file   Highest education level: Not on file  Occupational History    Employer: [employer]  Tobacco Use   Smoking status:  Former    Packs/day: 1.00    Years: 25.00    Total pack years: 25.00    Types: Cigarettes   Smokeless tobacco: Never   Tobacco comments:    Off and on   Vaping Use   Vaping Use: Never used  Substance and Sexual Activity   Alcohol use: Yes    Comment: occ   Drug use: No   Sexual activity: Not on file  Other Topics Concern   Not on file  Social History Narrative   ** Merged History Encounter **       Works for [employer] Strain: Not on file  Food Insecurity: Not on file  Transportation Needs: Not on file  Physical Activity: Not on file  Stress: Not on file  Social Connections: Not on file  Intimate Partner Violence: Not on file     ROS- All systems are reviewed and negative except as per the HPI above.  Physical Exam: Vitals:   03/27/22 0826  BP: (!) 166/76  Pulse: 61  Weight: 103.6 kg  Height: 5\' 10"  (1.778 m)    GEN- The patient is a well appearing obese male, alert and oriented x 3 today.   Head- normocephalic, atraumatic Eyes-  Sclera clear, conjunctiva pink Ears- hearing intact Oropharynx- clear Neck- supple  Lungs- Clear to ausculation bilaterally, normal work of breathing Heart- Regular rate and rhythm, no murmurs, rubs or gallops  GI- soft, NT, ND, + BS Extremities- no clubbing, cyanosis, or edema MS- no significant deformity or atrophy Skin- no rash or lesion Psych- euthymic mood, full affect Neuro- strength and sensation are intact  Wt Readings from Last 3 Encounters:  03/27/22 103.6 kg  06/26/21 104.5 kg  06/28/20 105.2 kg    EKG today demonstrates  SR Vent. rate 61 BPM PR interval 148 ms QRS duration 90 ms QT/QTcB 422/424 ms   Epic records are reviewed at length today  CHA2DS2-VASc Score = 2  The patient's score is based upon: CHF History: 0 HTN History: 1 Diabetes History: 0 Stroke History: 0 Vascular Disease History: 0 Age Score: 1 Gender Score: 0       ASSESSMENT AND PLAN: 1. Paroxysmal Atrial Fibrillation (ICD10:  I48.0) The patient's CHA2DS2-VASc score is 2, indicating a 2.2% annual risk of stroke.   We discussed rhythm control options today including increase flecainide, changing AAD (dofetilide), or ablation. Patient interested in consultation with EP for ablation to hopefully come off AAD, will refer.  Continue flecainide 100 mg BID for now. Patient may take extra 100 mg PRN for afib, would not repeat any more  frequent than 4 days.  Continue diltiazem 300 mg daily Continue warfarin Apple Watch for home monitoring.   2. Secondary Hypercoagulable State (ICD10:  D68.69) The patient is at significant risk for stroke/thromboembolism based upon his CHA2DS2-VASc Score of 2.  Continue Warfarin (Coumadin).   3. Obesity Body mass index is 32.77 kg/m. Lifestyle modification was discussed at length including regular exercise and weight reduction.  4. HTN Elevated today, better controlled at home.   Follow up with EP for ablation evaluation.    [facility] [address] [place], [place] [address] [phone] 03/27/2022 9:03 AM

## 2022-04-02 ENCOUNTER — Telehealth: Payer: Self-pay

## 2022-04-02 NOTE — Telephone Encounter (Signed)
Called pt to see if he wants his Echocardiogram rescheduled. No answer, left a message for him to reach out to [place] to confirm.

## 2022-04-06 ENCOUNTER — Ambulatory Visit (HOSPITAL_COMMUNITY): Payer: Medicare Other

## 2022-04-13 ENCOUNTER — Ambulatory Visit (INDEPENDENT_AMBULATORY_CARE_PROVIDER_SITE_OTHER): Payer: Medicare Other

## 2022-04-13 DIAGNOSIS — Z5181 Encounter for therapeutic drug level monitoring: Secondary | ICD-10-CM | POA: Diagnosis not present

## 2022-04-13 DIAGNOSIS — I4891 Unspecified atrial fibrillation: Secondary | ICD-10-CM

## 2022-04-13 DIAGNOSIS — I480 Paroxysmal atrial fibrillation: Secondary | ICD-10-CM

## 2022-04-13 DIAGNOSIS — Z7901 Long term (current) use of anticoagulants: Secondary | ICD-10-CM

## 2022-04-13 LAB — POCT INR: INR: 2.5 (ref 2.0–3.0)

## 2022-05-04 ENCOUNTER — Ambulatory Visit (HOSPITAL_COMMUNITY): Payer: Medicare Other

## 2022-05-04 ENCOUNTER — Encounter (HOSPITAL_COMMUNITY): Payer: Self-pay

## 2022-05-12 ENCOUNTER — Institutional Professional Consult (permissible substitution): Payer: Medicare Other | Admitting: Cardiology

## 2022-05-18 ENCOUNTER — Other Ambulatory Visit: Payer: Self-pay | Admitting: Cardiology

## 2022-05-18 DIAGNOSIS — I4891 Unspecified atrial fibrillation: Secondary | ICD-10-CM

## 2022-05-18 DIAGNOSIS — Z7901 Long term (current) use of anticoagulants: Secondary | ICD-10-CM

## 2022-05-25 ENCOUNTER — Ambulatory Visit (INDEPENDENT_AMBULATORY_CARE_PROVIDER_SITE_OTHER): Payer: Self-pay | Admitting: *Deleted

## 2022-05-25 DIAGNOSIS — I480 Paroxysmal atrial fibrillation: Secondary | ICD-10-CM

## 2022-05-25 DIAGNOSIS — Z7901 Long term (current) use of anticoagulants: Secondary | ICD-10-CM

## 2022-05-25 DIAGNOSIS — I4891 Unspecified atrial fibrillation: Secondary | ICD-10-CM

## 2022-05-25 LAB — POCT INR: INR: 2.5 (ref 2.0–3.0)

## 2022-05-25 NOTE — Patient Instructions (Signed)
Description    Continue taking 1 tablet daily except 1/2 tablet on Wednesday.  Repeat INR in 6 weeks. [phone]

## 2022-06-26 ENCOUNTER — Institutional Professional Consult (permissible substitution): Payer: Self-pay | Admitting: Cardiology

## 2022-06-30 ENCOUNTER — Other Ambulatory Visit: Payer: Self-pay | Admitting: Physician Assistant

## 2022-07-06 ENCOUNTER — Ambulatory Visit: Payer: Self-pay | Attending: Cardiology

## 2022-07-06 DIAGNOSIS — I4891 Unspecified atrial fibrillation: Secondary | ICD-10-CM

## 2022-07-06 DIAGNOSIS — Z7901 Long term (current) use of anticoagulants: Secondary | ICD-10-CM

## 2022-07-06 DIAGNOSIS — Z5181 Encounter for therapeutic drug level monitoring: Secondary | ICD-10-CM

## 2022-07-06 DIAGNOSIS — I480 Paroxysmal atrial fibrillation: Secondary | ICD-10-CM

## 2022-07-06 LAB — POCT INR: INR: 2.2 (ref 2.0–3.0)

## 2022-07-06 NOTE — Patient Instructions (Signed)
Continue taking 1 tablet daily except 1/2 tablet on Wednesday.  Repeat INR in 6 weeks. [phone]

## 2022-07-15 NOTE — Progress Notes (Signed)
Cardiology Office Note   Date:  07/17/2022   ID:  Scott Townsend, DOB May 24, 1956, MRN [id number]  PCP:  Patient, No Pcp Per  Cardiologist:   Rollene Rotunda, MD   Chief Complaint  Patient presents with   Palpitations      History of Present Illness: Scott Townsend is a 66 y.o. male who presents for follow up of atrial fibrillation.  He was diagnosed with atrial fibrillation in 2013 and has been cardioverted multiple times.  He was started on flecainide.  He  went back to atrial fibrillation and palpitation, he was started on Xarelto and had ramipril increased to 10 mg twice a day.  Unfortunately due to cost reasons, he was unable to take the Xarelto and was switched to Coumadin.  He also has significant dizziness and fatigue on the chlorthalidone.    He has had multiple afib episodes in January and April. On May 25th he had a prolonged symptomatic episode and took an extra dose of flecainide which eventually converted him. He is taking the scheduled flecainide and takes PRN with breakthrough flecainide.  Since I last saw him he is not had any other frequent breakthroughs.  He has been doing well.  He still works.  He is he denies any presyncope or syncope.  Has had no chest pain.   Past Medical History:  Diagnosis Date   Atrial fibrillation (HCC)    Barrett esophagus    Hypertension     Past Surgical History:  Procedure Laterality Date   KNEE SURGERY Left      Current Outpatient Medications  Medication Sig Dispense Refill   diltiazem (CARDIZEM CD) 300 MG 24 hr capsule Take 1 capsule (300 mg total) by mouth daily. Keep upcoming appointment for future refills 90 capsule 3   diphenhydramine-acetaminophen (TYLENOL PM) 25-500 MG TABS Take 1 tablet by mouth daily.     flecainide (TAMBOCOR) 100 MG tablet Take 1 tablet (100 mg total) by mouth 2 (two) times daily. SCHEDULE OFFICE VISIT FOR FUTURE REFILLS. 180 tablet 0   lansoprazole (PREVACID) 30 MG capsule Take 30 mg by  mouth daily.     ramipril (ALTACE) 10 MG capsule TAKE ONE CAPSULE BY MOUTH TWICE A DAY 180 capsule 3   warfarin (COUMADIN) 3 MG tablet TAKE ONE TABLET BY MOUTH EVERY EVENING OR AS DIRECTED BY COUMADIN CLINIC 90 tablet 1   hydrALAZINE (APRESOLINE) 25 MG tablet Take 1 tablet (25 mg total) by mouth 3 (three) times daily. 180 tablet 3   No current facility-administered medications for this visit.    Allergies:   Percocet [oxycodone-acetaminophen] and Vicodin [hydrocodone-acetaminophen]    ROS:  Please see the history of present illness.   Otherwise, review of systems are positive for none.   All other systems are reviewed and negative.    PHYSICAL EXAM: VS:  BP (!) 148/72   Pulse 78   Ht 5\' 10"  (1.778 m)   Wt 227 lb 9.6 oz (103.2 kg)   SpO2 97%   BMI 32.66 kg/m  , BMI Body mass index is 32.66 kg/m. GENERAL:  Well appearing NECK:  No jugular venous distention, waveform within normal limits, carotid upstroke brisk and symmetric, no bruits, no thyromegaly LUNGS:  Clear to auscultation bilaterally CHEST:  Unremarkable HEART:  PMI not displaced or sustained,S1 and S2 within normal limits, no S3, no S4, no clicks, no rubs, no murmurs ABD:  Flat, positive bowel sounds normal in frequency in pitch,  no bruits, no rebound, no guarding, no midline pulsatile mass, no hepatomegaly, no splenomegaly EXT:  2 plus pulses throughout, no edema, no cyanosis no clubbing  EKG:  EKG is not ordered today. NA  Recent Labs: No results found for requested labs within last 365 days.    Lipid Panel    Component Value Date/Time   CHOL 160 01/04/2017 1304   TRIG 86 01/04/2017 1304   HDL 61 01/04/2017 1304   CHOLHDL 2.6 01/04/2017 1304   VLDL 17 01/04/2017 1304   LDLCALC 82 01/04/2017 1304      Wt Readings from Last 3 Encounters:  07/17/22 227 lb 9.6 oz (103.2 kg)  03/27/22 228 lb 6.4 oz (103.6 kg)  06/26/21 230 lb 6.4 oz (104.5 kg)      Other studies Reviewed: Additional studies/ records that  were reviewed today include: None. Review of the above records demonstrates:  NA   ASSESSMENT AND PLAN:  PAF: He tolerates anticoagulation.  He has not been able to afford DOAC.  He does well with Coumadin.  He is rarely having to use as needed flecainide.  No change in therapy.   HTN: Blood pressure is not at target.  And bending increases in general is a to 10 mg 3 times daily instead of twice.    Current medicines are reviewed at length with the patient today.  The patient does not have concerns regarding medicines.  The following changes have been made: As above  Labs/ tests ordered today include: None   Orders Placed This Encounter  Procedures   CBC     Disposition:   FU with me in 12 months.     Signed, Minus Breeding, MD  07/17/2022 7:59 AM    [place]

## 2022-07-17 ENCOUNTER — Ambulatory Visit: Payer: Self-pay | Attending: Cardiology | Admitting: Cardiology

## 2022-07-17 ENCOUNTER — Encounter: Payer: Self-pay | Admitting: Cardiology

## 2022-07-17 VITALS — BP 148/72 | HR 78 | Ht 70.0 in | Wt 227.6 lb

## 2022-07-17 DIAGNOSIS — I10 Essential (primary) hypertension: Secondary | ICD-10-CM

## 2022-07-17 DIAGNOSIS — I4891 Unspecified atrial fibrillation: Secondary | ICD-10-CM

## 2022-07-17 LAB — CBC
Hematocrit: 42.8 % (ref 37.5–51.0)
Hemoglobin: 14.6 g/dL (ref 13.0–17.7)
MCH: 31.5 pg (ref 26.6–33.0)
MCHC: 34.1 g/dL (ref 31.5–35.7)
MCV: 92 fL (ref 79–97)
Platelets: 222 10*3/uL (ref 150–450)
RBC: 4.64 x10E6/uL (ref 4.14–5.80)
RDW: 12.4 % (ref 11.6–15.4)
WBC: 5.9 10*3/uL (ref 3.4–10.8)

## 2022-07-17 MED ORDER — HYDRALAZINE HCL 25 MG PO TABS: 25.0000 mg | ORAL_TABLET | Freq: Three times a day (TID) | ORAL | 3 refills | Status: DC

## 2022-07-17 NOTE — Patient Instructions (Signed)
Medication Instructions:  Take Hydralazine three times a day  *If you need a refill on your cardiac medications before your next appointment, please call your pharmacy*   Lab Work: CBC today  If you have labs (blood work) drawn today and your tests are completely normal, you will receive your results only by: [place] (if you have MyChart) OR A paper copy in the mail If you have any lab test that is abnormal or we need to change your treatment, we will call you to review the results.   Follow-Up: At [facility], you and your health needs are our priority.  As part of our continuing mission to provide you with exceptional heart care, we have created designated Provider Care Teams.  These Care Teams include your primary Cardiologist (physician) and Advanced Practice Providers (APPs -  Physician Assistants and Nurse Practitioners) who all work together to provide you with the care you need, when you need it.  We recommend signing up for the patient portal called "MyChart".  Sign up information is provided on this After Visit Summary.  MyChart is used to connect with patients for Virtual Visits (Telemedicine).  Patients are able to view lab/test results, encounter notes, upcoming appointments, etc.  Non-urgent messages can be sent to your provider as well.   To learn more about what you can do with MyChart, go to [email or web address].    Your next appointment:   12 month(s)  The format for your next appointment:   In Person  Provider:   Minus Breeding, MD

## 2022-07-24 ENCOUNTER — Encounter: Payer: Self-pay | Admitting: *Deleted

## 2022-07-28 ENCOUNTER — Other Ambulatory Visit: Payer: Self-pay | Admitting: Physician Assistant

## 2022-08-14 ENCOUNTER — Encounter: Payer: Self-pay | Admitting: Cardiology

## 2022-08-17 ENCOUNTER — Ambulatory Visit: Payer: Self-pay

## 2022-08-24 ENCOUNTER — Ambulatory Visit: Payer: Self-pay | Attending: Cardiology

## 2022-08-24 DIAGNOSIS — I480 Paroxysmal atrial fibrillation: Secondary | ICD-10-CM

## 2022-08-24 DIAGNOSIS — Z7901 Long term (current) use of anticoagulants: Secondary | ICD-10-CM

## 2022-08-24 DIAGNOSIS — I4891 Unspecified atrial fibrillation: Secondary | ICD-10-CM

## 2022-08-24 LAB — POCT INR: INR: 1.3 — AB (ref 2.0–3.0)

## 2022-08-24 NOTE — Patient Instructions (Signed)
Description   Take 2 tablets today and 1.5 tablets tomorrow and then continue taking 1 tablet daily except 1/2 tablet on Wednesday.   Repeat INR in 2 weeks. [phone]

## 2022-08-26 ENCOUNTER — Encounter: Payer: Self-pay | Admitting: Cardiology

## 2022-08-27 NOTE — Telephone Encounter (Signed)
Thanks for making Coumadin Clinic aware. Anticoagulation episode resolved in pt's chart.

## 2022-09-09 ENCOUNTER — Other Ambulatory Visit: Payer: Self-pay | Admitting: Physician Assistant

## 2022-09-23 ENCOUNTER — Encounter: Payer: Self-pay | Admitting: Cardiology

## 2022-09-23 ENCOUNTER — Other Ambulatory Visit: Payer: Self-pay | Admitting: *Deleted

## 2022-09-23 MED ORDER — FLECAINIDE ACETATE 100 MG PO TABS: 100.0000 mg | ORAL_TABLET | Freq: Two times a day (BID) | ORAL | 3 refills | Status: DC

## 2022-09-23 NOTE — Telephone Encounter (Signed)
From: [name] To: Office of [place], [place] Sent: 09/22/2022 10:07 AM EST Subject: Medication Renewal Request  Refills have been requested for the following medications:   flecainide (TAMBOCOR) 100 MG tablet [James Hochrein]  Patient Comment: I just saw Dr Antoine Poche in September   Preferred pharmacy: [facility] [id number] - [place], [place] - [address] Delivery method: Daryll Drown

## 2022-09-23 NOTE — Telephone Encounter (Signed)
Reviewed  patient chart . Last office visit 06/2022. Next  visit will be 12 months ( Sept 2024)  Medication reviewed

## 2022-09-23 NOTE — Telephone Encounter (Signed)
Sent message to patient medication refilled °

## 2022-11-17 ENCOUNTER — Other Ambulatory Visit: Payer: Self-pay | Admitting: Cardiology

## 2022-11-17 DIAGNOSIS — Z7901 Long term (current) use of anticoagulants: Secondary | ICD-10-CM

## 2022-11-17 DIAGNOSIS — I4891 Unspecified atrial fibrillation: Secondary | ICD-10-CM

## 2022-11-17 NOTE — Telephone Encounter (Signed)
Warfarin discontinued on 08/26/22.

## 2022-12-04 ENCOUNTER — Encounter: Payer: Self-pay | Admitting: Nurse Practitioner

## 2022-12-04 ENCOUNTER — Ambulatory Visit (INDEPENDENT_AMBULATORY_CARE_PROVIDER_SITE_OTHER): Payer: Medicare Other | Admitting: Nurse Practitioner

## 2022-12-04 VITALS — BP 144/78 | HR 79 | Temp 98.3°F | Ht 70.0 in | Wt 228.2 lb

## 2022-12-04 DIAGNOSIS — Z87891 Personal history of nicotine dependence: Secondary | ICD-10-CM | POA: Diagnosis not present

## 2022-12-04 DIAGNOSIS — M545 Low back pain, unspecified: Secondary | ICD-10-CM | POA: Insufficient documentation

## 2022-12-04 DIAGNOSIS — G8929 Other chronic pain: Secondary | ICD-10-CM | POA: Insufficient documentation

## 2022-12-04 DIAGNOSIS — I10 Essential (primary) hypertension: Secondary | ICD-10-CM

## 2022-12-04 DIAGNOSIS — I480 Paroxysmal atrial fibrillation: Secondary | ICD-10-CM | POA: Diagnosis not present

## 2022-12-04 DIAGNOSIS — M5442 Lumbago with sciatica, left side: Secondary | ICD-10-CM

## 2022-12-04 MED ORDER — PREDNISONE 10 MG PO TABS: ORAL_TABLET | ORAL | 0 refills | Status: DC

## 2022-12-04 NOTE — Assessment & Plan Note (Signed)
Chronic, stable.  He is currently following with cardiology.  Will have him continue diltiazem 300 mg daily and flecainide 100 mg twice a day.  He was taking Coumadin, however did not like the way it made him feel.  He declines anticoagulation at this point.  Will have him continue to follow-up with cardiology routinely.

## 2022-12-04 NOTE — Assessment & Plan Note (Signed)
Chronic, not controlled.  BP today 144/78.  He is working closely with cardiology to get his blood pressure under control, he states that his hydralazine was just increased to 25 mg 3 times a day.  Will also have him continue ramipril 10 mg daily.  Follow-up in 4 weeks.

## 2022-12-04 NOTE — Patient Instructions (Signed)
It was great to see you!  Start the stretches daily.  Start prednisone 6 tablets today, 5 tomorrow, 4 the next day, then keep decreasing by 1 every day until gone. Take this in the morning with food.   You are due for:  Pneumonia vaccine Shingrix vaccine  You can get these at your pharmacy   You are also due for:  Lung Cancer Screening - they will call to schedule  Colon Cancer Screening  Let's follow-up in 3-4 weeks, sooner if you have concerns.  If a referral was placed today, you will be contacted for an appointment. Please note that routine referrals can sometimes take up to 3-4 weeks to process. Please call our office if you haven't heard anything after this time frame.  Take care,  Vance Peper, NP

## 2022-12-04 NOTE — Assessment & Plan Note (Signed)
Symptoms started about 2 to 3 weeks ago.  No red flags on exam.  He did have a positive straight leg raise and tenderness to left lumbar spine with palpation.  Will have him start a prednisone taper, discussed possible side effects.  Also gave him stretches to do daily.  He can continue using heat or ice.  Follow-up in 3 to 4 weeks.

## 2022-12-04 NOTE — Assessment & Plan Note (Signed)
He has a 25-pack-year history of smoking, he stopped in 2013, congratulated him on this!  Discussed lung cancer screening with low-dose CT scan. He is very interested in this and would be willing to follow-up and receive treatment based on findings. Will order low-dose CT scan for lung cancer screening.

## 2022-12-04 NOTE — Progress Notes (Signed)
New Patient Visit  BP (!) 144/78 (BP Location: Right Arm, Cuff Size: Large)   Pulse 79   Temp 98.3 F (36.8 C)   Ht 5' 10"$  (1.778 m)   Wt 228 lb 3.2 oz (103.5 kg)   SpO2 97%   BMI 32.74 kg/m    Subjective:    Patient ID: Scott Townsend, male    DOB: May 23, 1956, 67 y.o.   MRN: [id number]  CC: Chief Complaint  Patient presents with   Establish Care    Concerns with having pain from left buttock that runs down leg to foot    HPI: Scott Townsend is a 67 y.o. male presents for new patient visit to establish care.  Introduced to Designer, jewellery role and practice setting.  All questions answered.  Discussed provider/patient relationship and expectations.  For the past 2-3 weeks, he has been having pain on his left buttock and it goes down to his foot. He describes the pain as severe with a throbbing and numbness/burning sensation. He denies injury. He states that sitting for long periods makes the pain worse. He has tried tylenol and ibuprofen. He states the tylenol arthritis has helped the most.   He has a history of hypertension and A-fib and is currently following with cardiology.  He is currently taking diltiazem 300 mg daily, flecainide 100 mg twice a day, hydralazine 25 mg 3 times a day, and ramipril 10 mg daily.  He has been checking his blood pressure at home and it has ranged from 140s to 160s/70s.  Cardiology recently increased his hydralazine to from twice a day to 3 times a day.  He denies chest pain and shortness of breath.  Depression and Anxiety Screen Done:     12/04/2022    8:03 AM  Depression screen PHQ 2/9  Decreased Interest 0  Down, Depressed, Hopeless 0  PHQ - 2 Score 0  Altered sleeping 2  Tired, decreased energy 0  Change in appetite 0  Feeling bad or failure about yourself  0  Trouble concentrating 0  Moving slowly or fidgety/restless 0  Suicidal thoughts 0  PHQ-9 Score 2  Difficult doing work/chores Not difficult at all      12/04/2022     8:04 AM  GAD 7 : Generalized Anxiety Score  Nervous, Anxious, on Edge 2  Control/stop worrying 2  Worry too much - different things 2  Trouble relaxing 1  Restless 1  Easily annoyed or irritable 1  Afraid - awful might happen 0  Total GAD 7 Score 9  Anxiety Difficulty Not difficult at all    Past Medical History:  Diagnosis Date   Arthritis    Atrial fibrillation (HCC)    Barrett esophagus    GERD (gastroesophageal reflux disease)    Hypertension     Past Surgical History:  Procedure Laterality Date   CARDIOVERSION      Family History  Problem Relation Age of Onset   Deep vein thrombosis Mother    Varicose Veins Mother    Alcohol abuse Father    Heart attack Father 45   Stroke Father 23     Social History   Tobacco Use   Smoking status: Former    Packs/day: 1.00    Years: 25.00    Total pack years: 25.00    Types: Cigarettes    Quit date: 07/20/2012    Years since quitting: 10.3   Smokeless tobacco: Never   Tobacco comments:  Off and on   Vaping Use   Vaping Use: Never used  Substance Use Topics   Alcohol use: Yes    Alcohol/week: 3.0 standard drinks of alcohol    Types: 3 Shots of liquor per week    Comment: occ   Drug use: No    Current Outpatient Medications on File Prior to Visit  Medication Sig Dispense Refill   diltiazem (CARDIZEM CD) 300 MG 24 hr capsule TAKE ONE CAPSULE BY MOUTH DAILY 90 capsule 3   diphenhydramine-acetaminophen (TYLENOL PM) 25-500 MG TABS Take 1 tablet by mouth daily.     flecainide (TAMBOCOR) 100 MG tablet Take 1 tablet (100 mg total) by mouth 2 (two) times daily. 180 tablet 3   hydrALAZINE (APRESOLINE) 25 MG tablet Take 1 tablet (25 mg total) by mouth 3 (three) times daily. 180 tablet 3   lansoprazole (PREVACID) 30 MG capsule Take 30 mg by mouth daily.     ramipril (ALTACE) 10 MG capsule TAKE ONE CAPSULE BY MOUTH TWICE A DAY 180 capsule 3   No current facility-administered medications on file prior to visit.      Review of Systems  Constitutional: Negative.   HENT: Negative.    Eyes: Negative.   Respiratory: Negative.    Cardiovascular: Negative.   Gastrointestinal: Negative.   Genitourinary:  Negative for dysuria and frequency.       Nocturia   Musculoskeletal:  Positive for back pain.  Skin: Negative.   Neurological:  Positive for dizziness (intermittent with turning quick).  Psychiatric/Behavioral: Negative.        Objective:    BP (!) 144/78 (BP Location: Right Arm, Cuff Size: Large)   Pulse 79   Temp 98.3 F (36.8 C)   Ht 5' 10"$  (1.778 m)   Wt 228 lb 3.2 oz (103.5 kg)   SpO2 97%   BMI 32.74 kg/m   Wt Readings from Last 3 Encounters:  12/04/22 228 lb 3.2 oz (103.5 kg)  07/17/22 227 lb 9.6 oz (103.2 kg)  03/27/22 228 lb 6.4 oz (103.6 kg)    BP Readings from Last 3 Encounters:  12/04/22 (!) 144/78  07/17/22 (!) 148/72  03/27/22 (!) 166/76    Physical Exam Vitals and nursing note reviewed.  Constitutional:      General: He is not in acute distress.    Appearance: Normal appearance.  HENT:     Head: Normocephalic and atraumatic.     Right Ear: Tympanic membrane, ear canal and external ear normal.     Left Ear: Tympanic membrane, ear canal and external ear normal.  Eyes:     Conjunctiva/sclera: Conjunctivae normal.  Cardiovascular:     Rate and Rhythm: Normal rate and regular rhythm.     Pulses: Normal pulses.     Heart sounds: Normal heart sounds.  Pulmonary:     Effort: Pulmonary effort is normal.     Breath sounds: Normal breath sounds.  Abdominal:     Palpations: Abdomen is soft.     Tenderness: There is no abdominal tenderness.  Musculoskeletal:        General: Tenderness (Left lower back) present. Normal range of motion.     Cervical back: Normal range of motion and neck supple. No tenderness.     Right lower leg: No edema.     Left lower leg: No edema.     Comments: Positive straight leg raise on left  Lymphadenopathy:     Cervical: No cervical  adenopathy.  Skin:  General: Skin is warm and dry.  Neurological:     General: No focal deficit present.     Mental Status: He is alert and oriented to person, place, and time.     Cranial Nerves: No cranial nerve deficit.     Coordination: Coordination normal.     Gait: Gait normal.  Psychiatric:        Mood and Affect: Mood normal.        Behavior: Behavior normal.        Thought Content: Thought content normal.        Judgment: Judgment normal.        Assessment & Plan:   Problem List Items Addressed This Visit       Cardiovascular and Mediastinum   Paroxysmal atrial fibrillation (HCC)    Chronic, stable.  He is currently following with cardiology.  Will have him continue diltiazem 300 mg daily and flecainide 100 mg twice a day.  He was taking Coumadin, however did not like the way it made him feel.  He declines anticoagulation at this point.  Will have him continue to follow-up with cardiology routinely.      Essential hypertension    Chronic, not controlled.  BP today 144/78.  He is working closely with cardiology to get his blood pressure under control, he states that his hydralazine was just increased to 25 mg 3 times a day.  Will also have him continue ramipril 10 mg daily.  Follow-up in 4 weeks.        Nervous and Auditory   Acute left-sided low back pain with left-sided sciatica - Primary    Symptoms started about 2 to 3 weeks ago.  No red flags on exam.  He did have a positive straight leg raise and tenderness to left lumbar spine with palpation.  Will have him start a prednisone taper, discussed possible side effects.  Also gave him stretches to do daily.  He can continue using heat or ice.  Follow-up in 3 to 4 weeks.      Relevant Medications   predniSONE (DELTASONE) 10 MG tablet     Other   History of tobacco use    He has a 25-pack-year history of smoking, he stopped in 2013, congratulated him on this!  Discussed lung cancer screening with low-dose CT scan.  He is very interested in this and would be willing to follow-up and receive treatment based on findings. Will order low-dose CT scan for lung cancer screening.       Relevant Orders   CT CHEST LUNG CANCER SCREENING LOW DOSE WO CONTRAST     Follow up plan: Return in about 4 weeks (around 01/01/2023) for 3-4 weeks , CPE.

## 2022-12-29 ENCOUNTER — Ambulatory Visit (INDEPENDENT_AMBULATORY_CARE_PROVIDER_SITE_OTHER): Payer: Medicare Other

## 2022-12-29 ENCOUNTER — Ambulatory Visit (INDEPENDENT_AMBULATORY_CARE_PROVIDER_SITE_OTHER): Payer: Medicare Other | Admitting: Nurse Practitioner

## 2022-12-29 ENCOUNTER — Encounter: Payer: Self-pay | Admitting: Nurse Practitioner

## 2022-12-29 VITALS — BP 144/80 | HR 66 | Temp 98.3°F | Ht 70.0 in | Wt 227.6 lb

## 2022-12-29 DIAGNOSIS — I480 Paroxysmal atrial fibrillation: Secondary | ICD-10-CM

## 2022-12-29 DIAGNOSIS — R351 Nocturia: Secondary | ICD-10-CM | POA: Diagnosis not present

## 2022-12-29 DIAGNOSIS — Z1211 Encounter for screening for malignant neoplasm of colon: Secondary | ICD-10-CM

## 2022-12-29 DIAGNOSIS — I10 Essential (primary) hypertension: Secondary | ICD-10-CM

## 2022-12-29 DIAGNOSIS — Z87891 Personal history of nicotine dependence: Secondary | ICD-10-CM | POA: Diagnosis not present

## 2022-12-29 DIAGNOSIS — M5442 Lumbago with sciatica, left side: Secondary | ICD-10-CM

## 2022-12-29 LAB — COMPREHENSIVE METABOLIC PANEL
ALT: 18 U/L (ref 0–53)
AST: 15 U/L (ref 0–37)
Albumin: 4.2 g/dL (ref 3.5–5.2)
Alkaline Phosphatase: 71 U/L (ref 39–117)
BUN: 13 mg/dL (ref 6–23)
CO2: 24 mEq/L (ref 19–32)
Calcium: 9.3 mg/dL (ref 8.4–10.5)
Chloride: 105 mEq/L (ref 96–112)
Creatinine, Ser: 0.98 mg/dL (ref 0.40–1.50)
GFR: 80.39 mL/min (ref >60.00)
Glucose, Bld: 100 mg/dL — ABNORMAL HIGH (ref 70–99)
Potassium: 4.1 mEq/L (ref 3.5–5.1)
Sodium: 139 mEq/L (ref 135–145)
Total Bilirubin: 0.4 mg/dL (ref 0.2–1.2)
Total Protein: 6.6 g/dL (ref 6.0–8.3)

## 2022-12-29 LAB — LIPID PANEL
Cholesterol: 148 mg/dL (ref 0–200)
HDL: 58.9 mg/dL (ref >39.00)
LDL Cholesterol: 77 mg/dL (ref 0–99)
NonHDL: 88.8
Total CHOL/HDL Ratio: 3
Triglycerides: 57 mg/dL (ref 0.0–149.0)
VLDL: 11.4 mg/dL (ref 0.0–40.0)

## 2022-12-29 LAB — PSA: PSA: 1.74 ng/mL (ref 0.10–4.00)

## 2022-12-29 LAB — CBC
HCT: 44.2 % (ref 39.0–52.0)
Hemoglobin: 15.1 g/dL (ref 13.0–17.0)
MCHC: 34.3 g/dL (ref 30.0–36.0)
MCV: 92.5 fl (ref 78.0–100.0)
Platelets: 235 10*3/uL (ref 150.0–400.0)
RBC: 4.78 Mil/uL (ref 4.22–5.81)
RDW: 13.2 % (ref 11.5–15.5)
WBC: 6.3 10*3/uL (ref 4.0–10.5)

## 2022-12-29 MED ORDER — GABAPENTIN 300 MG PO CAPS: 300.0000 mg | ORAL_CAPSULE | Freq: Two times a day (BID) | ORAL | 1 refills | Status: DC

## 2022-12-29 NOTE — Patient Instructions (Signed)
It was great to see you!  We are checking your labs today and will let you know the results via mychart/phone.   You are due for the following vaccines you can get at your pharmacy:  *Pneumonia (prevnar 20) *Shingles (shingrix)  I have placed a referral to physical therapy.   Start gabapentin 1 capsule at bedtime for 7 days, then increase to twice a day for your back pain/sciatica  Let's follow-up in 8 weeks, sooner if you have concerns.  If a referral was placed today, you will be contacted for an appointment. Please note that routine referrals can sometimes take up to 3-4 weeks to process. Please call our office if you haven't heard anything after this time frame.  Take care,  Vance Peper, NP

## 2022-12-29 NOTE — Assessment & Plan Note (Signed)
Symptoms ongoing now for about 7 weeks.  No red flags on exam.  Will have him start gabapentin 300 mg at bedtime for 7 days then can increase to twice a day.  Referral placed to physical therapy.  Will also get an x-ray as he will most likely need an MRI.  Continue stretches at home.  Follow-up in 6 to 8 weeks.

## 2022-12-29 NOTE — Assessment & Plan Note (Signed)
Chronic, not controlled.  BP today 140/80.  He is having significant pain, will hold off on increasing hydralazine today as blood pressure could be elevated due to pain.  Will have him continue hydralazine 25 mg 3 times daily and ramipril 10 mg daily.  He is also taking diltiazem 300 mg daily and flecainide 100 mg twice a day.  Follow-up in 2 months.  Check CMP, CBC, lipid panel today

## 2022-12-29 NOTE — Progress Notes (Signed)
BP (!) 144/80 (BP Location: Right Arm, Cuff Size: Large)   Pulse 66   Temp 98.3 F (36.8 C)   Ht '5\' 10"'$  (1.778 m)   Wt 227 lb 9.6 oz (103.2 kg)   SpO2 96%   BMI 32.66 kg/m    Subjective:    Patient ID: Scott Townsend, male    DOB: Dec 30, 1955, 67 y.o.   MRN: [id number]  CC: Chief Complaint  Patient presents with   Annual Exam    Concerns with sciatica    HPI: Scott Townsend is a 67 y.o. male presenting on 12/29/2022 for comprehensive medical examination. Current medical complaints include: back pain with sciatica  He states the prednisone helped the first couple of days, then stopped working.  He is still having severe back pain that radiates down his left leg.  He is unable to sit for more than 5 minutes at a time.  He has been doing the stretches daily.  He currently lives with: wife  Depression and Anxiety Screen done last visit and results listed below:     12/04/2022    8:03 AM  Depression screen PHQ 2/9  Decreased Interest 0  Down, Depressed, Hopeless 0  PHQ - 2 Score 0  Altered sleeping 2  Tired, decreased energy 0  Change in appetite 0  Feeling bad or failure about yourself  0  Trouble concentrating 0  Moving slowly or fidgety/restless 0  Suicidal thoughts 0  PHQ-9 Score 2  Difficult doing work/chores Not difficult at all      12/04/2022    8:04 AM  GAD 7 : Generalized Anxiety Score  Nervous, Anxious, on Edge 2  Control/stop worrying 2  Worry too much - different things 2  Trouble relaxing 1  Restless 1  Easily annoyed or irritable 1  Afraid - awful might happen 0  Total GAD 7 Score 9  Anxiety Difficulty Not difficult at all    The patient does not have a history of falls. I did not complete a risk assessment for falls. A plan of care for falls was not documented.   Past Medical History:  Past Medical History:  Diagnosis Date   Arthritis    Atrial fibrillation ([place])    Barrett esophagus    GERD (gastroesophageal reflux disease)     Hypertension     Surgical History:  Past Surgical History:  Procedure Laterality Date   CARDIOVERSION      Medications:  Current Outpatient Medications on File Prior to Visit  Medication Sig   diltiazem (CARDIZEM CD) 300 MG 24 hr capsule TAKE ONE CAPSULE BY MOUTH DAILY   diphenhydramine-acetaminophen (TYLENOL PM) 25-500 MG TABS Take 1 tablet by mouth daily.   flecainide (TAMBOCOR) 100 MG tablet Take 1 tablet (100 mg total) by mouth 2 (two) times daily.   hydrALAZINE (APRESOLINE) 25 MG tablet Take 1 tablet (25 mg total) by mouth 3 (three) times daily.   lansoprazole (PREVACID) 30 MG capsule Take 30 mg by mouth daily.   ramipril (ALTACE) 10 MG capsule TAKE ONE CAPSULE BY MOUTH TWICE A DAY   No current facility-administered medications on file prior to visit.    Allergies:  Allergies  Allergen Reactions   Percocet [Oxycodone-Acetaminophen] Nausea And Vomiting   Vicodin [Hydrocodone-Acetaminophen] Nausea Only    Social History:  Social History   Socioeconomic History   Marital status: Married    Spouse name: Not on file   Number of children: 3   Years  of education: Not on file   Highest education level: Not on file  Occupational History    Employer: [employer]  Tobacco Use   Smoking status: Former    Packs/day: 1.00    Years: 25.00    Total pack years: 25.00    Types: Cigarettes    Quit date: 07/20/2012    Years since quitting: 10.4   Smokeless tobacco: Never   Tobacco comments:    Off and on   Vaping Use   Vaping Use: Never used  Substance and Sexual Activity   Alcohol use: Yes    Alcohol/week: 3.0 standard drinks of alcohol    Types: 3 Shots of liquor per week    Comment: occ   Drug use: No   Sexual activity: Not Currently  Other Topics Concern   Not on file  Social History Narrative   Not on file   Social Determinants of Health   Financial Resource Strain: Not on file  Food Insecurity: Not on file  Transportation Needs: Not on file  Physical  Activity: Not on file  Stress: Not on file  Social Connections: Not on file  Intimate Partner Violence: Not on file   Social History   Tobacco Use  Smoking Status Former   Packs/day: 1.00   Years: 25.00   Total pack years: 25.00   Types: Cigarettes   Quit date: 07/20/2012   Years since quitting: 10.4  Smokeless Tobacco Never  Tobacco Comments   Off and on    Social History   Substance and Sexual Activity  Alcohol Use Yes   Alcohol/week: 3.0 standard drinks of alcohol   Types: 3 Shots of liquor per week   Comment: occ    Family History:  Family History  Problem Relation Age of Onset   Deep vein thrombosis Mother    Varicose Veins Mother    Alcohol abuse Father    Heart attack Father 74   Stroke Father 9    Past medical history, surgical history, medications, allergies, family history and social history reviewed with patient today and changes made to appropriate areas of the chart.   Review of Systems  Constitutional:  Positive for malaise/fatigue. Negative for fever.  HENT: Negative.    Eyes: Negative.   Respiratory: Negative.    Cardiovascular: Negative.   Gastrointestinal: Negative.   Genitourinary:        Wakes up once at night to urinate  Musculoskeletal:  Positive for back pain.  Skin: Negative.   Neurological: Negative.   Psychiatric/Behavioral: Negative.     All other ROS negative except what is listed above and in the HPI.      Objective:    BP (!) 144/80 (BP Location: Right Arm, Cuff Size: Large)   Pulse 66   Temp 98.3 F (36.8 C)   Ht '5\' 10"'$  (1.778 m)   Wt 227 lb 9.6 oz (103.2 kg)   SpO2 96%   BMI 32.66 kg/m   Wt Readings from Last 3 Encounters:  12/29/22 227 lb 9.6 oz (103.2 kg)  12/04/22 228 lb 3.2 oz (103.5 kg)  07/17/22 227 lb 9.6 oz (103.2 kg)    Physical Exam Vitals and nursing note reviewed.  Constitutional:      General: He is not in acute distress.    Appearance: Normal appearance.  HENT:     Head: Normocephalic and  atraumatic.     Right Ear: Tympanic membrane, ear canal and external ear normal.     Left Ear:  Tympanic membrane, ear canal and external ear normal.  Eyes:     Conjunctiva/sclera: Conjunctivae normal.  Cardiovascular:     Rate and Rhythm: Normal rate and regular rhythm.     Pulses: Normal pulses.     Heart sounds: Normal heart sounds.  Pulmonary:     Effort: Pulmonary effort is normal.     Breath sounds: Normal breath sounds.  Abdominal:     Palpations: Abdomen is soft.     Tenderness: There is no abdominal tenderness.  Musculoskeletal:        General: No tenderness. Normal range of motion.     Cervical back: Normal range of motion and neck supple. No tenderness.     Right lower leg: No edema.     Left lower leg: No edema.     Comments: Positive straight leg raise on left  Lymphadenopathy:     Cervical: No cervical adenopathy.  Skin:    General: Skin is warm and dry.  Neurological:     General: No focal deficit present.     Mental Status: He is alert and oriented to person, place, and time.     Cranial Nerves: No cranial nerve deficit.     Coordination: Coordination normal.     Gait: Gait normal.  Psychiatric:        Mood and Affect: Mood normal.        Behavior: Behavior normal.        Thought Content: Thought content normal.        Judgment: Judgment normal.     Results for orders placed or performed in visit on 08/24/22  POCT INR  Result Value Ref Range   INR 1.3 (A) 2.0 - 3.0   POC INR        Assessment & Plan:   Problem List Items Addressed This Visit       Cardiovascular and Mediastinum   Paroxysmal atrial fibrillation (HCC)    Chronic, stable.  He is currently following with cardiology.  Will have him continue diltiazem 300 mg daily and flecainide 100 mg twice a day.  He was taking Coumadin, however did not like the way it made him feel.  He declines anticoagulation at this point.  Will have him continue to follow-up with cardiology routinely.       Essential hypertension    Chronic, not controlled.  BP today 140/80.  He is having significant pain, will hold off on increasing hydralazine today as blood pressure could be elevated due to pain.  Will have him continue hydralazine 25 mg 3 times daily and ramipril 10 mg daily.  He is also taking diltiazem 300 mg daily and flecainide 100 mg twice a day.  Follow-up in 2 months.  Check CMP, CBC, lipid panel today      Relevant Orders   Comprehensive metabolic panel   CBC   Lipid panel     Nervous and Auditory   Acute left-sided low back pain with left-sided sciatica - Primary    Symptoms ongoing now for about 7 weeks.  No red flags on exam.  Will have him start gabapentin 300 mg at bedtime for 7 days then can increase to twice a day.  Referral placed to physical therapy.  Will also get an x-ray as he will most likely need an MRI.  Continue stretches at home.  Follow-up in 6 to 8 weeks.      Relevant Medications   gabapentin (NEURONTIN) 300 MG capsule   Other Relevant  Orders   Ambulatory referral to Physical Therapy   DG Lumbar Spine Complete     Other   History of tobacco use    He is scheduled for CT lung next week.      Other Visit Diagnoses     Nocturia       Will check PSA today   Relevant Orders   PSA   Screen for colon cancer       Cologuard ordered today   Relevant Orders   Cologuard        LABORATORY TESTING:  Health maintenance labs ordered today as discussed above.   The natural history of prostate cancer and ongoing controversy regarding screening and potential treatment outcomes of prostate cancer has been discussed with the patient. The meaning of a false positive PSA and a false negative PSA has been discussed. He indicates understanding of the limitations of this screening test and wishes to proceed with screening PSA testing.   IMMUNIZATIONS:   - Tdap: Tetanus vaccination status reviewed: last tetanus booster within 10 years. - Influenza: Refused -  Prevnar:  discussed getting at pharmacy - HPV: Not applicable - Zostavax vaccine:  discussed getting at pharmacy  SCREENING: - Colonoscopy: Ordered today  Discussed with patient purpose of the colonoscopy is to detect colon cancer at curable precancerous or early stages   -Lung Cancer: Scheduled  PATIENT COUNSELING:    Sexuality: Discussed sexually transmitted diseases, partner selection, use of condoms, avoidance of unintended pregnancy  and contraceptive alternatives.   Advised to avoid cigarette smoking.  I discussed with the patient that most people either abstain from alcohol or drink within safe limits (<=14/week and <=4 drinks/occasion for males, <=7/weeks and <= 3 drinks/occasion for females) and that the risk for alcohol disorders and other health effects rises proportionally with the number of drinks per week and how often a drinker exceeds daily limits.  Discussed cessation/primary prevention of drug use and availability of treatment for abuse.   Diet: Encouraged to adjust caloric intake to maintain  or achieve ideal body weight, to reduce intake of dietary saturated fat and total fat, to limit sodium intake by avoiding high sodium foods and not adding table salt, and to maintain adequate dietary potassium and calcium preferably from fresh fruits, vegetables, and low-fat dairy products.    stressed the importance of regular exercise  Injury prevention: Discussed safety belts, safety helmets, smoke detector, smoking near bedding or upholstery.   Dental health: Discussed importance of regular tooth brushing, flossing, and dental visits.   Follow up plan: NEXT PREVENTATIVE PHYSICAL DUE IN 1 YEAR. Return in about 2 months (around 02/28/2023) for back pain.

## 2022-12-29 NOTE — Assessment & Plan Note (Signed)
Chronic, stable.  He is currently following with cardiology.  Will have him continue diltiazem 300 mg daily and flecainide 100 mg twice a day.  He was taking Coumadin, however did not like the way it made him feel.  He declines anticoagulation at this point.  Will have him continue to follow-up with cardiology routinely. 

## 2022-12-29 NOTE — Assessment & Plan Note (Signed)
He is scheduled for CT lung next week.

## 2023-01-01 ENCOUNTER — Ambulatory Visit
Admission: RE | Admit: 2023-01-01 | Discharge: 2023-01-01 | Disposition: A | Discharge status: Home / Self Care | Payer: Medicare Other | Source: Ambulatory Visit | Attending: Nurse Practitioner | Admitting: Nurse Practitioner

## 2023-01-01 DIAGNOSIS — M5442 Lumbago with sciatica, left side: Secondary | ICD-10-CM

## 2023-01-04 ENCOUNTER — Other Ambulatory Visit: Payer: Self-pay

## 2023-01-04 ENCOUNTER — Ambulatory Visit: Payer: Medicare Other | Attending: Nurse Practitioner

## 2023-01-04 DIAGNOSIS — M25652 Stiffness of left hip, not elsewhere classified: Secondary | ICD-10-CM | POA: Insufficient documentation

## 2023-01-04 DIAGNOSIS — M5442 Lumbago with sciatica, left side: Secondary | ICD-10-CM | POA: Diagnosis present

## 2023-01-04 NOTE — Therapy (Signed)
OUTPATIENT PHYSICAL THERAPY THORACOLUMBAR EVALUATION   Patient Name: Scott Townsend MRN: [id number] DOB:06-21-1956, 67 y.o., male Today's Date: 01/04/2023  END OF SESSION:  PT End of Session - 01/04/23 0803     Visit Number 1    Date for PT Re-Evaluation 03/29/23    PT Start Time 0803    PT Stop Time 0845    PT Time Calculation (min) 42 min    Activity Tolerance Patient tolerated treatment well    Behavior During Therapy [facility] for tasks assessed/performed             Past Medical History:  Diagnosis Date   Arthritis    Atrial fibrillation ([place])    Barrett esophagus    GERD (gastroesophageal reflux disease)    Hypertension    Past Surgical History:  Procedure Laterality Date   CARDIOVERSION     Patient Active Problem List   Diagnosis Date Noted   Acute left-sided low back pain with left-sided sciatica 12/04/2022   History of tobacco use 12/04/2022   Atrial fibrillation ([place]) 03/08/2020   Paroxysmal atrial fibrillation ([place]) 01/25/2020   Essential hypertension 01/25/2020   Educated about COVID-19 virus infection 01/25/2020   Atrial fibrillation with RVR ([place]) 02/16/2012    PCP: Vance Peper, NP  REFERRING PROVIDER: Vance Peper, NP  REFERRING DIAG: acute LBP with L sciatica  Rationale for Evaluation and Treatment: Rehabilitation  THERAPY DIAG:  Acute midline low back pain with left-sided sciatica  Hip joint stiffness, left  ONSET DATE: 2 months, 10/19/22  SUBJECTIVE:                                                                                                                                                                                           SUBJECTIVE STATEMENT: L lower back really hurts when I sit too long, just started, no trauma or injury  PERTINENT HISTORY:  Gradual onset of L gluteal pain and lower back pain  PAIN:  Are you having pain? Yes: NPRS scale: 8/10 Pain location: L lower back and gluts Pain description: deep pain  hurts progressively worse with sitting Aggravating factors: sitting prolonged Relieving factors: standing walking  PRECAUTIONS: None  WEIGHT BEARING RESTRICTIONS: No  FALLS:  Has patient fallen in last 6 months? No  LIVING ENVIRONMENT: Lives with: lives with their spouse Lives in: House/apartment Stairs: Yes: External: 3 steps; on right going up Has following equipment at home: None  OCCUPATION: works in Proofreader with loading/ unloading, Medical laboratory scientific officer  PLOF: Independent  PATIENT GOALS: get rid of this pain so that I can drive places  NEXT MD VISIT: unkown  OBJECTIVE:  DIAGNOSTIC FINDINGS:  01/01/23:  MRI results: IMPRESSION: Mild multilevel degenerative disc disease, worst at L5-S1.  PATIENT SURVEYS:  FOTO 74.1%  SCREENING FOR RED FLAGS: Bowel or bladder incontinence: No Spinal tumors: No Cauda equina syndrome: No Compression fracture: No Abdominal aneurysm: No  COGNITION: Overall cognitive status: Within functional limits for tasks assessed     SENSATION: WFL  MUSCLE LENGTH: Thomas test: Right nt deg; Left wfl deg  POSTURE: decreased lumbar lordosis  PALPATION: Point tender L lumbar paraspinals, multifidus, L 3 to S1 levels Segmental mobility PA lumbar, + pain L 3 to S 1  LUMBAR ROM:   AROM eval  Flexion Wfl, p! L buttock  Extension 25%  Right lateral flexion wfl  Left lateral flexion 50% P!  Right rotation wfl  Left rotation wfl   (Blank rows = not tested  LOWER EXTREMITY ROM:     AAROM Right eval Left eval  Hip flexion 115 100  Hip extension wfl   Hip abduction    Hip adduction    Hip internal rotation    Hip external rotation  15  Knee flexion    Knee extension    Ankle dorsiflexion    Ankle plantarflexion    Ankle inversion    Ankle eversion     (Blank rows = not tested)  LOWER EXTREMITY MMT:    MMT Right eval Left eval  Hip flexion wfl wfl  Hip extension    Hip abduction wfl wfl  Hip adduction    Hip internal  rotation    Hip external rotation    Knee flexion    Knee extension wfl wfl  Ankle dorsiflexion heel walking wfl wfl  Ankle plantarflexion toe walking wfl wfl  Ankle inversion    Ankle eversion     (Blank rows = not tested)  FABER test: Positive, and Thomas test: Negative  LUMBAR SPECIAL TESTS:  Straight leg raise test:L Positive, Slump test: Positive B, Fabers + L  GAIT: Distance walked: in clinic 97' Assistive device utilized: None Level of assistance: Complete Independence Comments: no deficits  TODAY'S TREATMENT:                                                                                                                              DATE: 01/04/23 eval , side lying R for muscle energy for  lumbar facets, to improve extension and side bending mobility Side lying L for muscle energy to correct for L lumbar flex, rot. SB restriction, 2 sets 5 reps each Therex:  Instructed in prone props  Instructed in standing L sided lumbar facet joint flex, R side bending, R rotation mobility   PATIENT EDUCATION:  Education details: POC, goals, expectations Person educated: Patient Education method: Explanation and Demonstration Education comprehension: verbalized understanding, tactile cues required, and needs further education  HOME EXERCISE PROGRAM: Access Code: [email or web address] URL: [email or web address] Date: 01/04/2023 Prepared by: Hayden Pedro  Exercises - Prone Press Up  - 1  x daily - 7 x weekly - 3 sets - 10 reps - Prone Press Up On Elbows  - 1 x daily - 7 x weekly - 3 sets - 10 reps  ASSESSMENT:  CLINICAL IMPRESSION: Patient is a 67 y.o. male who was seen today for physical therapy evaluation and treatment for lumbar spine pain and L gluteal radiculopathy.  He has restrictions with lumbar ROM particularly for extension,and L side bending. He did not have any strength deficits.  Did have + neural tension testing . Responded well to techniques / specific exercises to  improve his L lumbo sacral mobility.  He also may benefit from lumbar traction, dry needling due to the muscular tension L lumbar multifidus and gluteus maximus. Also would recommend continued therex focused on improving mobility L lumbar facets. Will benefit from skilled PT to address his deficits and goals..   OBJECTIVE IMPAIRMENTS: decreased activity tolerance, decreased mobility, decreased ROM, hypomobility, impaired perceived functional ability, impaired flexibility, and pain.   ACTIVITY LIMITATIONS: sitting and squatting  PARTICIPATION LIMITATIONS: driving, community activity, and yard work  PERSONAL FACTORS: Time since onset of injury/illness/exacerbation and 1 comorbidity: HTN  are also affecting patient's functional outcome.   REHAB POTENTIAL: Good  CLINICAL DECISION MAKING: Stable/uncomplicated  EVALUATION COMPLEXITY: Low   GOALS: Goals reviewed with patient? Yes  SHORT TERM GOALS: Target date: 01/18/23  I HEP  Baseline:established today Goal status: INITIAL   LONG TERM GOALS: Target date: 03/29/23  FOTO 82% Baseline: 74% Goal status: INITIAL  2.  Improve L hip ROM for flexion to 110, and Faber's to wfl and pain free for better sitting tolerance Baseline: L hp flexion 105, Faber's painful Goal status: INITIAL  3.  Able to tolerate sitting for greater than 1 hour without lower back and L gluteal pain so that he may drive for longer periods  Baseline: pain at 10 min Goal status: INITIAL   PLAN:  PT FREQUENCY: 1-2x/week  PT DURATION: 12 weeks  PLANNED INTERVENTIONS: Therapeutic exercises, Therapeutic activity, Neuromuscular re-education, Balance training, Gait training, Patient/Family education, Self Care, and Joint mobilization.  PLAN FOR NEXT SESSION: progress with ? Dry needling, possibly traction, manual stretching, muscle energy, therex   Gemini Bunte L Makari Sanko, PT 01/04/2023, 5:23 PM

## 2023-01-05 ENCOUNTER — Encounter: Payer: Self-pay | Admitting: Nurse Practitioner

## 2023-01-06 ENCOUNTER — Ambulatory Visit
Admission: RE | Admit: 2023-01-06 | Discharge: 2023-01-06 | Disposition: A | Discharge status: Home / Self Care | Payer: Medicare Other | Source: Ambulatory Visit | Attending: Nurse Practitioner | Admitting: Nurse Practitioner

## 2023-01-06 DIAGNOSIS — Z87891 Personal history of nicotine dependence: Secondary | ICD-10-CM

## 2023-01-07 ENCOUNTER — Encounter: Payer: Self-pay | Admitting: Physical Therapy

## 2023-01-07 ENCOUNTER — Ambulatory Visit: Payer: Medicare Other | Admitting: Physical Therapy

## 2023-01-07 DIAGNOSIS — M5442 Lumbago with sciatica, left side: Secondary | ICD-10-CM

## 2023-01-07 DIAGNOSIS — M25652 Stiffness of left hip, not elsewhere classified: Secondary | ICD-10-CM

## 2023-01-07 LAB — COLOGUARD: COLOGUARD: NEGATIVE

## 2023-01-07 NOTE — Therapy (Signed)
OUTPATIENT PHYSICAL THERAPY THORACOLUMBAR EVALUATION   Patient Name: Scott Townsend MRN: [id number] DOB:Feb 04, 1956, 67 y.o., male Today's Date: 01/07/2023  END OF SESSION:  PT End of Session - 01/07/23 0758     Visit Number 2    Date for PT Re-Evaluation 03/29/23    PT Start Time 0754    PT Stop Time 0835    PT Time Calculation (min) 41 min    Activity Tolerance Patient tolerated treatment well    Behavior During Therapy [facility] for tasks assessed/performed              Past Medical History:  Diagnosis Date   Arthritis    Atrial fibrillation ([place])    Barrett esophagus    GERD (gastroesophageal reflux disease)    Hypertension    Past Surgical History:  Procedure Laterality Date   CARDIOVERSION     Patient Active Problem List   Diagnosis Date Noted   Acute left-sided low back pain with left-sided sciatica 12/04/2022   History of tobacco use 12/04/2022   Atrial fibrillation ([place]) 03/08/2020   Paroxysmal atrial fibrillation ([place]) 01/25/2020   Essential hypertension 01/25/2020   Educated about COVID-19 virus infection 01/25/2020   Atrial fibrillation with RVR ([place]) 02/16/2012    PCP: Vance Peper, NP  REFERRING PROVIDER: Vance Peper, NP  REFERRING DIAG: acute LBP with L sciatica  Rationale for Evaluation and Treatment: Rehabilitation  THERAPY DIAG:  Acute midline low back pain with left-sided sciatica  Hip joint stiffness, left  ONSET DATE: 2 months, 10/19/22  SUBJECTIVE:                                                                                                                                                                                           SUBJECTIVE STATEMENT: Patient reports no change in his symptoms. Pain starts within 5 minutes of sitting.  PERTINENT HISTORY:  Gradual onset of L gluteal pain and lower back pain  PAIN:  Are you having pain? Yes: NPRS scale: 8/10 Pain location: L lower back and gluts Pain description: deep  pain hurts progressively worse with sitting Aggravating factors: sitting prolonged Relieving factors: standing walking  PRECAUTIONS: None  WEIGHT BEARING RESTRICTIONS: No  FALLS:  Has patient fallen in last 6 months? No  LIVING ENVIRONMENT: Lives with: lives with their spouse Lives in: House/apartment Stairs: Yes: External: 3 steps; on right going up Has following equipment at home: None  OCCUPATION: works in Proofreader with loading/ unloading, Medical laboratory scientific officer  PLOF: Independent  PATIENT GOALS: get rid of this pain so that I can drive places  NEXT MD VISIT: unkown  OBJECTIVE:  DIAGNOSTIC FINDINGS:  01/01/23:  MRI results: IMPRESSION: Mild multilevel degenerative disc disease, worst at L5-S1.  PATIENT SURVEYS:  FOTO 74.1%  SCREENING FOR RED FLAGS: Bowel or bladder incontinence: No Spinal tumors: No Cauda equina syndrome: No Compression fracture: No Abdominal aneurysm: No  COGNITION: Overall cognitive status: Within functional limits for tasks assessed     SENSATION: WFL  MUSCLE LENGTH: Thomas test: Right nt deg; Left wfl deg  POSTURE: decreased lumbar lordosis  PALPATION: Point tender L lumbar paraspinals, multifidus, L 3 to S1 levels Segmental mobility PA lumbar, + pain L 3 to S 1  LUMBAR ROM:   AROM eval  Flexion Wfl, p! L buttock  Extension 25%  Right lateral flexion wfl  Left lateral flexion 50% P!  Right rotation wfl  Left rotation wfl   (Blank rows = not tested  LOWER EXTREMITY ROM:     AAROM Right eval Left eval  Hip flexion 115 100  Hip extension wfl   Hip abduction    Hip adduction    Hip internal rotation    Hip external rotation  15  Knee flexion    Knee extension    Ankle dorsiflexion    Ankle plantarflexion    Ankle inversion    Ankle eversion     (Blank rows = not tested)  LOWER EXTREMITY MMT:    MMT Right eval Left eval  Hip flexion wfl wfl  Hip extension    Hip abduction wfl wfl  Hip adduction    Hip  internal rotation    Hip external rotation    Knee flexion    Knee extension wfl wfl  Ankle dorsiflexion heel walking wfl wfl  Ankle plantarflexion toe walking wfl wfl  Ankle inversion    Ankle eversion     (Blank rows = not tested)  FABER test: Positive, and Thomas test: Negative  LUMBAR SPECIAL TESTS:  Straight leg raise test:L Positive, Slump test: Positive B, Fabers + L  GAIT: Distance walked: in clinic 65' Assistive device utilized: None Level of assistance: Complete Independence Comments: no deficits  TODAY'S TREATMENT:                                                                                                                              DATE:  01/07/23 Nustep L5 x 6 min IASTM to L gluts, piriformis, lumbar paraspinals. Supine stretching-Figure 4, glut stretch, 3 way stretch with strap-HS, ABD/ADD, LTR Supine clamshells, PPT, bridge x 10 reps each- TC to correctly perform  pelvic tilt. Seated pelvic tilts Standing hip abd with UE support against G tband resistance at knees, 10 each CP to L gluts for inflammation.  01/04/23 eval , side lying R for muscle energy for  lumbar facets, to improve extension and side bending mobility Side lying L for muscle energy to correct for L lumbar flex, rot. SB restriction, 2 sets 5 reps each Therex:  Instructed in prone props  Instructed in standing  L sided lumbar facet joint flex, R side bending, R rotation mobility   PATIENT EDUCATION:  Education details: POC, goals, expectations Person educated: Patient Education method: Explanation and Demonstration Education comprehension: verbalized understanding, tactile cues required, and needs further education  HOME EXERCISE PROGRAM: Access Code: [email or web address] URL: [email or web address] Date: 01/04/2023 Prepared by: Warren Lacy Speaks  Exercises - Prone Press Up  - 1 x daily - 7 x weekly - 3 sets - 10 reps - Prone Press Up On Elbows  - 1 x daily - 7 x weekly - 3 sets - 10  reps  ASSESSMENT:  CLINICAL IMPRESSION: Patient reports some relief from pain after evaluation and treatment activities. Progressed with STM and stretch F/B strengthening to gluts and piriformis with reported relief.  OBJECTIVE IMPAIRMENTS: decreased activity tolerance, decreased mobility, decreased ROM, hypomobility, impaired perceived functional ability, impaired flexibility, and pain.   ACTIVITY LIMITATIONS: sitting and squatting  PARTICIPATION LIMITATIONS: driving, community activity, and yard work  PERSONAL FACTORS: Time since onset of injury/illness/exacerbation and 1 comorbidity: HTN  are also affecting patient's functional outcome.   REHAB POTENTIAL: Good  CLINICAL DECISION MAKING: Stable/uncomplicated  EVALUATION COMPLEXITY: Low   GOALS: Goals reviewed with patient? Yes  SHORT TERM GOALS: Target date: 01/18/23  I HEP  Baseline:established today Goal status: 01/07/23 HEP provided.   LONG TERM GOALS: Target date: 03/29/23  FOTO 82% Baseline: 74% Goal status: INITIAL  2.  Improve L hip ROM for flexion to 110, and Faber's to wfl and pain free for better sitting tolerance Baseline: L hp flexion 105, Faber's painful Goal status: INITIAL  3.  Able to tolerate sitting for greater than 1 hour without lower back and L gluteal pain so that he may drive for longer periods  Baseline: pain at 10 min Goal status: INITIAL   PLAN:  PT FREQUENCY: 1-2x/week  PT DURATION: 12 weeks  PLANNED INTERVENTIONS: Therapeutic exercises, Therapeutic activity, Neuromuscular re-education, Balance training, Gait training, Patient/Family education, Self Care, and Joint mobilization.  PLAN FOR NEXT SESSION: progress with ? Dry needling, possibly traction, manual stretching, muscle energy, therex   Marcelina Morel, DPT 01/07/2023, 8:52 AM

## 2023-01-10 ENCOUNTER — Encounter: Payer: Self-pay | Admitting: Cardiology

## 2023-01-11 ENCOUNTER — Ambulatory Visit: Payer: Medicare Other

## 2023-01-11 ENCOUNTER — Other Ambulatory Visit: Payer: Self-pay

## 2023-01-11 DIAGNOSIS — M5442 Lumbago with sciatica, left side: Secondary | ICD-10-CM | POA: Diagnosis not present

## 2023-01-11 DIAGNOSIS — M25652 Stiffness of left hip, not elsewhere classified: Secondary | ICD-10-CM

## 2023-01-11 NOTE — Therapy (Signed)
OUTPATIENT PHYSICAL THERAPY THORACOLUMBAR EVALUATION   Patient Name: Scott Townsend MRN: [id number] DOB:06-Dec-1955, 67 y.o., male Today's Date: 01/11/2023  END OF SESSION:  PT End of Session - 01/11/23 0842     Visit Number 3    Date for PT Re-Evaluation 03/29/23    PT Start Time 0843    PT Stop Time 0930    PT Time Calculation (min) 47 min              Past Medical History:  Diagnosis Date   Arthritis    Atrial fibrillation ([facility])    Barrett esophagus    GERD (gastroesophageal reflux disease)    Hypertension    Past Surgical History:  Procedure Laterality Date   CARDIOVERSION     Patient Active Problem List   Diagnosis Date Noted   Acute left-sided low back pain with left-sided sciatica 12/04/2022   History of tobacco use 12/04/2022   Atrial fibrillation ([place]) 03/08/2020   Paroxysmal atrial fibrillation ([place]) 01/25/2020   Essential hypertension 01/25/2020   Educated about COVID-19 virus infection 01/25/2020   Atrial fibrillation with RVR ([place]) 02/16/2012    PCP: Vance Peper, NP  REFERRING PROVIDER: Vance Peper, NP  REFERRING DIAG: acute LBP with L sciatica  Rationale for Evaluation and Treatment: Rehabilitation  THERAPY DIAG:  Acute midline low back pain with left-sided sciatica  Hip joint stiffness, left  ONSET DATE: 2 months, 10/19/22  SUBJECTIVE:                                                                                                                                                                                           SUBJECTIVE STATEMENT: Patient reports improved Sx after last session, able to tolerate sitting longer, with less intensity with pain  PERTINENT HISTORY:  Gradual onset of L gluteal pain and lower back pain  PAIN:  Are you having pain? Yes: NPRS scale: 8/10 Pain location: L lower back and gluts Pain description: deep pain hurts progressively worse with sitting Aggravating factors: sitting  prolonged Relieving factors: standing walking  PRECAUTIONS: None  WEIGHT BEARING RESTRICTIONS: No  FALLS:  Has patient fallen in last 6 months? No  LIVING ENVIRONMENT: Lives with: lives with their spouse Lives in: House/apartment Stairs: Yes: External: 3 steps; on right going up Has following equipment at home: None  OCCUPATION: works in Proofreader with loading/ unloading, Medical laboratory scientific officer  PLOF: Independent  PATIENT GOALS: get rid of this pain so that I can drive places  NEXT MD VISIT: unkown  OBJECTIVE:   DIAGNOSTIC FINDINGS:  01/01/23:  MRI results: IMPRESSION: Mild multilevel degenerative disc disease, worst at L5-S1.  PATIENT SURVEYS:  FOTO 74.1%  SCREENING FOR RED FLAGS: Bowel or bladder incontinence: No Spinal tumors: No Cauda equina syndrome: No Compression fracture: No Abdominal aneurysm: No  COGNITION: Overall cognitive status: Within functional limits for tasks assessed     SENSATION: WFL  MUSCLE LENGTH: Thomas test: Right nt deg; Left wfl deg  POSTURE: decreased lumbar lordosis  PALPATION: Point tender L lumbar paraspinals, multifidus, L 3 to S1 levels Segmental mobility PA lumbar, + pain L 3 to S 1  LUMBAR ROM:   AROM eval  Flexion Wfl, p! L buttock  Extension 25%  Right lateral flexion wfl  Left lateral flexion 50% P!  Right rotation wfl  Left rotation wfl   (Blank rows = not tested  LOWER EXTREMITY ROM:     AAROM Right eval Left eval  Hip flexion 115 100  Hip extension wfl   Hip abduction    Hip adduction    Hip internal rotation    Hip external rotation  15  Knee flexion    Knee extension    Ankle dorsiflexion    Ankle plantarflexion    Ankle inversion    Ankle eversion     (Blank rows = not tested)  LOWER EXTREMITY MMT:    MMT Right eval Left eval  Hip flexion wfl wfl  Hip extension    Hip abduction wfl wfl  Hip adduction    Hip internal rotation    Hip external rotation    Knee flexion    Knee  extension wfl wfl  Ankle dorsiflexion heel walking wfl wfl  Ankle plantarflexion toe walking wfl wfl  Ankle inversion    Ankle eversion     (Blank rows = not tested)  FABER test: Positive, and Thomas test: Negative  LUMBAR SPECIAL TESTS:  Straight leg raise test:L Positive, Slump test: Positive B, Fabers + L  GAIT: Distance walked: in clinic 20' Assistive device utilized: None Level of assistance: Complete Independence Comments: no deficits  TODAY'S TREATMENT:                                                                                                                              DATE:  01/11/23: Mechanical traction: Supine for pelvic traction,80# max, 40 #min,  intermittent 60 on/20 off, for 15 min with moist heat lower back to improve joint spacing and soft tissue extensibility lumbar region, prior to therex.   Therapeutic exercise: Supine LTR 10 x Bridging 10 x Prone props on elbows  10 x 5 sec holds  Manual: Therapist provided L PSIS distraction/ PA glide while patient performing lumbar press ups.  10 x  L sidelying, for muscle energy to improve flexion, rotation, SB restriction L lumbar region, 2 bouts 6 reps each Provocative sign: L hip ER painful initially, improved to pain free after completion of therex and manual techniques  Therapeutic exercise:  01/07/23 Nustep L5 x 6 min IASTM to L gluts, piriformis, lumbar paraspinals. Supine  stretching-Figure 4, glut stretch, 3 way stretch with strap-HS, ABD/ADD, LTR Supine clamshells, PPT, bridge x 10 reps each- TC to correctly perform  pelvic tilt. Seated pelvic tilts Standing hip abd with UE support against G tband resistance at knees, 10 each CP to L gluts for inflammation.  01/04/23 eval , side lying R for muscle energy for  lumbar facets, to improve extension and side bending mobility Side lying L for muscle energy to correct for L lumbar flex, rot. SB restriction, 2 sets 5 reps each Therex:  Instructed in prone  props  Instructed in standing L sided lumbar facet joint flex, R side bending, R rotation mobility   PATIENT EDUCATION:  Education details: POC, goals, expectations Person educated: Patient Education method: Explanation and Demonstration Education comprehension: verbalized understanding, tactile cues required, and needs further education  HOME EXERCISE PROGRAM: Access Code: [email or web address] URL: [email or web address] Date: 01/04/2023 Prepared by: Warren Lacy Seri Kimmer  Exercises - Prone Press Up  - 1 x daily - 7 x weekly - 3 sets - 10 reps - Prone Press Up On Elbows  - 1 x daily - 7 x weekly - 3 sets - 10 reps  ASSESSMENT:  CLINICAL IMPRESSION: Patient reports some relief from pain after evaluation and treatment activities over the last 2 sessions.  Added pelvic traction to precede his therex and manual stretching.  Provocation of Sx with standing lumbar flexion and with L hip Er in supine initially, pain free with these motions by end of session.  Will continue and reassess and progress as indicated. OBJECTIVE IMPAIRMENTS: decreased activity tolerance, decreased mobility, decreased ROM, hypomobility, impaired perceived functional ability, impaired flexibility, and pain.   ACTIVITY LIMITATIONS: sitting and squatting  PARTICIPATION LIMITATIONS: driving, community activity, and yard work  PERSONAL FACTORS: Time since onset of injury/illness/exacerbation and 1 comorbidity: HTN  are also affecting patient's functional outcome.   REHAB POTENTIAL: Good  CLINICAL DECISION MAKING: Stable/uncomplicated  EVALUATION COMPLEXITY: Low   GOALS: Goals reviewed with patient? Yes  SHORT TERM GOALS: Target date: 01/18/23  I HEP  Baseline:established today Goal status: 01/07/23 HEP provided.   LONG TERM GOALS: Target date: 03/29/23  FOTO 82% Baseline: 74% Goal status: IN PROGRESS  2.  Improve L hip ROM for flexion to 110, and Faber's to wfl and pain free for better sitting  tolerance Baseline: L hp flexion 105, Faber's painful Goal status: IN PROGRESS  3.  Able to tolerate sitting for greater than 1 hour without lower back and L gluteal pain so that he may drive for longer periods  Baseline: pain at 10 min Goal status: IN PROGRESS   PLAN:  PT FREQUENCY: 1-2x/week  PT DURATION: 12 weeks  PLANNED INTERVENTIONS: Therapeutic exercises, Therapeutic activity, Neuromuscular re-education, Balance training, Gait training, Patient/Family education, Self Care, and Joint mobilization.  PLAN FOR NEXT SESSION: progress with ? Dry needling, possibly traction, manual stretching, muscle energy, therex  Joory Gough, PT, DPT  01/11/2023, 2:15 PM

## 2023-01-13 ENCOUNTER — Ambulatory Visit: Payer: Medicare Other

## 2023-01-13 ENCOUNTER — Other Ambulatory Visit: Payer: Self-pay

## 2023-01-13 DIAGNOSIS — M5442 Lumbago with sciatica, left side: Secondary | ICD-10-CM

## 2023-01-13 DIAGNOSIS — M25652 Stiffness of left hip, not elsewhere classified: Secondary | ICD-10-CM

## 2023-01-13 NOTE — Therapy (Signed)
OUTPATIENT PHYSICAL THERAPY THORACOLUMBAR TREATMENT   Patient Name: Scott Townsend MRN: [id number] DOB:1956/02/23, 67 y.o., male Today's Date: 01/13/2023  END OF SESSION:  PT End of Session - 01/13/23 0821     Visit Number 4    Date for PT Re-Evaluation 03/29/23    PT Start Time 0803    PT Stop Time 0845    PT Time Calculation (min) 42 min    Activity Tolerance Patient tolerated treatment well    Behavior During Therapy [facility] for tasks assessed/performed              Past Medical History:  Diagnosis Date   Arthritis    Atrial fibrillation ([place])    Barrett esophagus    GERD (gastroesophageal reflux disease)    Hypertension    Past Surgical History:  Procedure Laterality Date   CARDIOVERSION     Patient Active Problem List   Diagnosis Date Noted   Acute left-sided low back pain with left-sided sciatica 12/04/2022   History of tobacco use 12/04/2022   Atrial fibrillation ([place]) 03/08/2020   Paroxysmal atrial fibrillation ([place]) 01/25/2020   Essential hypertension 01/25/2020   Educated about COVID-19 virus infection 01/25/2020   Atrial fibrillation with RVR ([place]) 02/16/2012    PCP: Vance Peper, NP  REFERRING PROVIDER: Vance Peper, NP  REFERRING DIAG: acute LBP with L sciatica  Rationale for Evaluation and Treatment: Rehabilitation  THERAPY DIAG:  Acute midline low back pain with left-sided sciatica  Hip joint stiffness, left  ONSET DATE: 2 months, 10/19/22  SUBJECTIVE:                                                                                                                                                                                           SUBJECTIVE STATEMENT: Patient reports improved Sx after last session for 24 hrs, now pain with sitting L calf . PERTINENT HISTORY:  Gradual onset of L gluteal pain and lower back pain  PAIN:  Are you having pain? Yes: NPRS scale: 8/10 Pain location: L lower back and gluts Pain description: deep  pain hurts progressively worse with sitting Aggravating factors: sitting prolonged Relieving factors: standing walking  PRECAUTIONS: None  WEIGHT BEARING RESTRICTIONS: No  FALLS:  Has patient fallen in last 6 months? No  LIVING ENVIRONMENT: Lives with: lives with their spouse Lives in: House/apartment Stairs: Yes: External: 3 steps; on right going up Has following equipment at home: None  OCCUPATION: works in Proofreader with loading/ unloading, Medical laboratory scientific officer  PLOF: Independent  PATIENT GOALS: get rid of this pain so that I can drive places  NEXT MD VISIT: unkown  OBJECTIVE:  DIAGNOSTIC FINDINGS:  01/01/23:  MRI results: IMPRESSION: Mild multilevel degenerative disc disease, worst at L5-S1.  PATIENT SURVEYS:  FOTO 74.1%  SCREENING FOR RED FLAGS: Bowel or bladder incontinence: No Spinal tumors: No Cauda equina syndrome: No Compression fracture: No Abdominal aneurysm: No  COGNITION: Overall cognitive status: Within functional limits for tasks assessed     SENSATION: WFL  MUSCLE LENGTH: Thomas test: Right nt deg; Left wfl deg  POSTURE: decreased lumbar lordosis  PALPATION: Point tender L lumbar paraspinals, multifidus, L 3 to S1 levels Segmental mobility PA lumbar, + pain L 3 to S 1  LUMBAR ROM:   AROM eval  Flexion Wfl, p! L buttock  Extension 25%  Right lateral flexion wfl  Left lateral flexion 50% P!  Right rotation wfl  Left rotation wfl   (Blank rows = not tested  LOWER EXTREMITY ROM:     AAROM Right eval Left eval  Hip flexion 115 100  Hip extension wfl   Hip abduction    Hip adduction    Hip internal rotation    Hip external rotation  15  Knee flexion    Knee extension    Ankle dorsiflexion    Ankle plantarflexion    Ankle inversion    Ankle eversion     (Blank rows = not tested)  LOWER EXTREMITY MMT:    MMT Right eval Left eval  Hip flexion wfl wfl  Hip extension    Hip abduction wfl wfl  Hip adduction    Hip  internal rotation    Hip external rotation    Knee flexion    Knee extension wfl wfl  Ankle dorsiflexion heel walking wfl wfl  Ankle plantarflexion toe walking wfl wfl  Ankle inversion    Ankle eversion     (Blank rows = not tested)  FABER test: Positive, and Thomas test: Negative  LUMBAR SPECIAL TESTS:  Straight leg raise test:L Positive, Slump test: Positive B, Fabers + L  GAIT: Distance walked: in clinic 68' Assistive device utilized: None Level of assistance: Complete Independence Comments: no deficits  TODAY'S TREATMENT:                                                                                                                              DATE:  01/13/23 Mechanical traction: Supine for pelvic traction,80# max, 40 #min,  intermittent 60 on/20 off, for 15 min with moist heat lower back to improve joint spacing and soft tissue extensibility lumbar region, prior to therex.   Manual: dry needling L piriformis, L L5 multifidus, L gluteus medius, with good twitch response and palpable, visible muscle relaxation noted. The pt was informed of possible muscle soreness, bruising after this technique  Prone for light resisted L hip IR and ER through ROM, pain free, 10 reps   01/11/23: Mechanical traction: Supine for pelvic traction,80# max, 40 #min,  intermittent 60 on/20 off, for 15 min with moist heat lower back to improve  joint spacing and soft tissue extensibility lumbar region, prior to therex.   Therapeutic exercise: Supine LTR 10 x Bridging 10 x Prone props on elbows  10 x 5 sec holds  Manual: Therapist provided L PSIS distraction/ PA glide while patient performing lumbar press ups.  10 x  L sidelying, for muscle energy to improve flexion, rotation, SB restriction L lumbar region, 2 bouts 6 reps each Provocative sign: L hip ER painful initially, improved to pain free after completion of therex and manual techniques  Therapeutic exercise:  01/07/23 Nustep L5 x 6  min IASTM to L gluts, piriformis, lumbar paraspinals. Supine stretching-Figure 4, glut stretch, 3 way stretch with strap-HS, ABD/ADD, LTR Supine clamshells, PPT, bridge x 10 reps each- TC to correctly perform  pelvic tilt. Seated pelvic tilts Standing hip abd with UE support against G tband resistance at knees, 10 each CP to L gluts for inflammation.  01/04/23 eval , side lying R for muscle energy for  lumbar facets, to improve extension and side bending mobility Side lying L for muscle energy to correct for L lumbar flex, rot. SB restriction, 2 sets 5 reps each Therex:  Instructed in prone props  Instructed in standing L sided lumbar facet joint flex, R side bending, R rotation mobility   PATIENT EDUCATION:  Education details: POC, goals, expectations Person educated: Patient Education method: Explanation and Demonstration Education comprehension: verbalized understanding, tactile cues required, and needs further education  HOME EXERCISE PROGRAM: Access Code: [email or web address] URL: [email or web address] Date: 01/04/2023 Prepared by: Warren Lacy Jolea Dolle  Exercises - Prone Press Up  - 1 x daily - 7 x weekly - 3 sets - 10 reps - Prone Press Up On Elbows  - 1 x daily - 7 x weekly - 3 sets - 10 reps  ASSESSMENT:  CLINICAL IMPRESSION: Patient reports some relief from pain after evaluation and treatment activities over the last 3 sessions,however increased peripheral Sx today.  Continued  pelvic traction , added dry needling and light resisted hip strengthening to address his muscular restrictions and did not perform any joint mobs today.  Improved sitting tolerance immediately post session.  Will continue and reassess and progress as indicated.   OBJECTIVE IMPAIRMENTS: decreased activity tolerance, decreased mobility, decreased ROM, hypomobility, impaired perceived functional ability, impaired flexibility, and pain.   ACTIVITY LIMITATIONS: sitting and squatting  PARTICIPATION  LIMITATIONS: driving, community activity, and yard work  PERSONAL FACTORS: Time since onset of injury/illness/exacerbation and 1 comorbidity: HTN  are also affecting patient's functional outcome.   REHAB POTENTIAL: Good  CLINICAL DECISION MAKING: Stable/uncomplicated  EVALUATION COMPLEXITY: Low   GOALS: Goals reviewed with patient? Yes  SHORT TERM GOALS: Target date: 01/18/23  I HEP  Baseline:established today Goal status: 01/07/23 HEP provided.   LONG TERM GOALS: Target date: 03/29/23  FOTO 82% Baseline: 74% Goal status: IN PROGRESS  2.  Improve L hip ROM for flexion to 110, and Faber's to wfl and pain free for better sitting tolerance Baseline: L hp flexion 105, Faber's painful Goal status: IN PROGRESS  3.  Able to tolerate sitting for greater than 1 hour without lower back and L gluteal pain so that he may drive for longer periods  Baseline: pain at 10 min Goal status: IN PROGRESS   PLAN:  PT FREQUENCY: 1-2x/week  PT DURATION: 12 weeks  PLANNED INTERVENTIONS: Therapeutic exercises, Therapeutic activity, Neuromuscular re-education, Balance training, Gait training, Patient/Family education, Self Care, and Joint mobilization.  PLAN FOR NEXT SESSION: how was dry needling, possibly traction, manual  stretching, muscle energy, therex  Tracker Mance, PT, DPT  01/13/2023, 9:53 AM

## 2023-01-18 ENCOUNTER — Other Ambulatory Visit: Payer: Self-pay

## 2023-01-18 ENCOUNTER — Ambulatory Visit: Payer: Medicare Other | Attending: Nurse Practitioner

## 2023-01-18 DIAGNOSIS — M5442 Lumbago with sciatica, left side: Secondary | ICD-10-CM | POA: Diagnosis not present

## 2023-01-18 DIAGNOSIS — M25652 Stiffness of left hip, not elsewhere classified: Secondary | ICD-10-CM | POA: Diagnosis present

## 2023-01-18 NOTE — Therapy (Signed)
OUTPATIENT PHYSICAL THERAPY THORACOLUMBAR TREATMENT   Patient Name: Scott Townsend MRN: [id number] DOB:October 26, 1955, 67 y.o., male Today's Date: 01/18/2023  END OF SESSION:  PT End of Session - 01/18/23 0816     Visit Number 5    Date for PT Re-Evaluation 03/29/23    PT Start Time 0845    PT Stop Time 0930    PT Time Calculation (min) 45 min    Activity Tolerance Patient tolerated treatment well    Behavior During Therapy [facility] for tasks assessed/performed              Past Medical History:  Diagnosis Date   Arthritis    Atrial fibrillation    Barrett esophagus    GERD (gastroesophageal reflux disease)    Hypertension    Past Surgical History:  Procedure Laterality Date   CARDIOVERSION     Patient Active Problem List   Diagnosis Date Noted   Acute left-sided low back pain with left-sided sciatica 12/04/2022   History of tobacco use 12/04/2022   Atrial fibrillation 03/08/2020   Paroxysmal atrial fibrillation 01/25/2020   Essential hypertension 01/25/2020   Educated about COVID-19 virus infection 01/25/2020   Atrial fibrillation with RVR 02/16/2012    PCP: Vance Peper, NP  REFERRING PROVIDER: Vance Peper, NP  REFERRING DIAG: acute LBP with L sciatica  Rationale for Evaluation and Treatment: Rehabilitation  THERAPY DIAG:  No diagnosis found.  ONSET DATE: 2 months, 10/19/22  SUBJECTIVE:                                                                                                                                                                                           SUBJECTIVE STATEMENT: Patient reports much improved after the last session. Still having pain L gluts and L post thigh with prolonged sitting but took a lot longer to hit today with driving in. PERTINENT HISTORY:  Gradual onset of L gluteal pain and lower back pain  PAIN:  Are you having pain? Yes: NPRS scale: 8/10 Pain location: L lower back and gluts Pain description: deep pain  hurts progressively worse with sitting Aggravating factors: sitting prolonged Relieving factors: standing walking  PRECAUTIONS: None  WEIGHT BEARING RESTRICTIONS: No  FALLS:  Has patient fallen in last 6 months? No  LIVING ENVIRONMENT: Lives with: lives with their spouse Lives in: House/apartment Stairs: Yes: External: 3 steps; on right going up Has following equipment at home: None  OCCUPATION: works in Proofreader with loading/ unloading, Medical laboratory scientific officer  PLOF: Independent  PATIENT GOALS: get rid of this pain so that I can drive places  NEXT MD VISIT: unkown  OBJECTIVE:  DIAGNOSTIC FINDINGS:  01/01/23:  MRI results: IMPRESSION: Mild multilevel degenerative disc disease, worst at L5-S1.  PATIENT SURVEYS:  FOTO 74.1%  SCREENING FOR RED FLAGS: Bowel or bladder incontinence: No Spinal tumors: No Cauda equina syndrome: No Compression fracture: No Abdominal aneurysm: No  COGNITION: Overall cognitive status: Within functional limits for tasks assessed     SENSATION: WFL  MUSCLE LENGTH: Thomas test: Right nt deg; Left wfl deg  POSTURE: decreased lumbar lordosis  PALPATION: Point tender L lumbar paraspinals, multifidus, L 3 to S1 levels Segmental mobility PA lumbar, + pain L 3 to S 1  LUMBAR ROM:   AROM eval  Flexion Wfl, p! L buttock  Extension 25%  Right lateral flexion wfl  Left lateral flexion 50% P!  Right rotation wfl  Left rotation wfl   (Blank rows = not tested  LOWER EXTREMITY ROM:     AAROM Right eval Left eval  Hip flexion 115 100  Hip extension wfl   Hip abduction    Hip adduction    Hip internal rotation    Hip external rotation  15  Knee flexion    Knee extension    Ankle dorsiflexion    Ankle plantarflexion    Ankle inversion    Ankle eversion     (Blank rows = not tested)  LOWER EXTREMITY MMT:    MMT Right eval Left eval  Hip flexion wfl wfl  Hip extension    Hip abduction wfl wfl  Hip adduction    Hip internal  rotation    Hip external rotation    Knee flexion    Knee extension wfl wfl  Ankle dorsiflexion heel walking wfl wfl  Ankle plantarflexion toe walking wfl wfl  Ankle inversion    Ankle eversion     (Blank rows = not tested)  FABER test: Positive, and Thomas test: Negative  LUMBAR SPECIAL TESTS:  Straight leg raise test:L Positive, Slump test: Positive B, Fabers + L  GAIT: Distance walked: in clinic 58' Assistive device utilized: None Level of assistance: Complete Independence Comments: no deficits  TODAY'S TREATMENT:                                                                                                                              DATE:  01/18/23:   Reassessed lumbar motion, FB, SB, ext wnl. FB with L post thigh Sx.  Supine SLR - R, + L.  Proceeded with the following interventions:  Mechanical traction: Supine for pelvic traction,80# max, 40 #min,  intermittent 60 on/20 off, for 15 min with moist heat lower back to improve joint spacing and soft tissue extensibility lumbar region, prior to therex.   Manual:   Dry needling:  DN L L4/5 multifidus, L piriformis, L glut maximus, all with twitch response .   Prone for light manual resistance for L hip IR/ER through available ROM, 8 reps.  01/13/23 Mechanical traction: Supine for pelvic traction,80# max, 40 #min,  intermittent 60 on/20 off, for 15 min with moist heat lower back to improve joint spacing and soft tissue extensibility lumbar region, prior to therex.   Manual: dry needling L piriformis, L L5 multifidus, L gluteus medius, with good twitch response and palpable, visible muscle relaxation noted. The pt was informed of possible muscle soreness, bruising after this technique  Prone for light resisted L hip IR and ER through ROM, pain free, 10 reps  Prone press ups with therapist providing manual PA pressure L SI jt 10 reps, 5 sec holds   01/11/23: Mechanical traction: Supine for pelvic traction,80# max, 40 #min,   intermittent 60 on/20 off, for 15 min with moist heat lower back to improve joint spacing and soft tissue extensibility lumbar region, prior to therex.   Therapeutic exercise: Supine LTR 10 x Bridging 10 x Prone props on elbows  10 x 5 sec holds  Manual: Therapist provided L PSIS distraction/ PA glide while patient performing lumbar press ups.  10 x  L sidelying, for muscle energy to improve flexion, rotation, SB restriction L lumbar region, 2 bouts 6 reps each Provocative sign: L hip ER painful initially, improved to pain free after completion of therex and manual techniques  Therapeutic exercise:  01/07/23 Nustep L5 x 6 min IASTM to L gluts, piriformis, lumbar paraspinals. Supine stretching-Figure 4, glut stretch, 3 way stretch with strap-HS, ABD/ADD, LTR Supine clamshells, PPT, bridge x 10 reps each- TC to correctly perform  pelvic tilt. Seated pelvic tilts Standing hip abd with UE support against G tband resistance at knees, 10 each CP to L gluts for inflammation.  01/04/23 eval , side lying R for muscle energy for  lumbar facets, to improve extension and side bending mobility Side lying L for muscle energy to correct for L lumbar flex, rot. SB restriction, 2 sets 5 reps each Therex:  Instructed in prone props  Instructed in standing L sided lumbar facet joint flex, R side bending, R rotation mobility   PATIENT EDUCATION:  Education details: POC, goals, expectations Person educated: Patient Education method: Explanation and Demonstration Education comprehension: verbalized understanding, tactile cues required, and needs further education  HOME EXERCISE PROGRAM: Access Code: [email or web address] URL: [email or web address] Date: 01/04/2023 Prepared by: Warren Lacy Shivon Hackel  Exercises - Prone Press Up  - 1 x daily - 7 x weekly - 3 sets - 10 reps - Prone Press Up On Elbows  - 1 x daily - 7 x weekly - 3 sets - 10 reps  ASSESSMENT:  CLINICAL IMPRESSION: Patient attending skilled  PT to address acute L lumbar radiculopathy, aggravated by sitting.  Improved overall , still experiencing L post thigh pain with prolonged sitting however with less intensity and with longer sitting time prior to onset of Sx.  Changed techniques over the last 2 sessions to incorporate dry needling and muscle energy to engage deep rotators L hip. Recommend continue with current treatment routine, advancing other hip rotator strengthening.  OBJECTIVE IMPAIRMENTS: decreased activity tolerance, decreased mobility, decreased ROM, hypomobility, impaired perceived functional ability, impaired flexibility, and pain.   ACTIVITY LIMITATIONS: sitting and squatting  PARTICIPATION LIMITATIONS: driving, community activity, and yard work  PERSONAL FACTORS: Time since onset of injury/illness/exacerbation and 1 comorbidity: HTN  are also affecting patient's functional outcome.   REHAB POTENTIAL: Good  CLINICAL DECISION MAKING: Stable/uncomplicated  EVALUATION COMPLEXITY: Low   GOALS: Goals reviewed with patient? Yes  SHORT TERM GOALS: Target date: 01/18/23  I HEP  Baseline:established today Goal status: 01/07/23 HEP provided.  LONG TERM GOALS: Target date: 03/29/23  FOTO 82% Baseline: 74% Goal status: IN PROGRESS  2.  Improve L hip ROM for flexion to 110, and Faber's to wfl and pain free for better sitting tolerance Baseline: L hp flexion 105, Faber's painful Goal status: IN PROGRESS  3.  Able to tolerate sitting for greater than 1 hour without lower back and L gluteal pain so that he may drive for longer periods  Baseline: pain at 10 min Goal status: IN PROGRESS   PLAN:  PT FREQUENCY: 1-2x/week  PT DURATION: 12 weeks  PLANNED INTERVENTIONS: Therapeutic exercises, Therapeutic activity, Neuromuscular re-education, Balance training, Gait training, Patient/Family education, Self Care, and Joint mobilization.  PLAN FOR NEXT SESSION: how was dry needling, review whether  traction still  indicated, manual stretching, muscle energy, therex  Areona Homer, PT, DPT  01/18/2023, 10:27 AM

## 2023-01-21 ENCOUNTER — Encounter: Payer: Self-pay | Admitting: Physical Therapy

## 2023-01-21 ENCOUNTER — Ambulatory Visit: Payer: Medicare Other | Admitting: Physical Therapy

## 2023-01-21 DIAGNOSIS — M25652 Stiffness of left hip, not elsewhere classified: Secondary | ICD-10-CM

## 2023-01-21 DIAGNOSIS — M5442 Lumbago with sciatica, left side: Secondary | ICD-10-CM

## 2023-01-21 NOTE — Therapy (Signed)
OUTPATIENT PHYSICAL THERAPY THORACOLUMBAR TREATMENT   Patient Name: Scott Townsend MRN: [id number] DOB:1956/01/25, 67 y.o., male Today's Date: 01/21/2023  END OF SESSION:  PT End of Session - 01/21/23 0754     Visit Number 6    Date for PT Re-Evaluation 03/29/23    PT Start Time 0753    PT Stop Time 0835    PT Time Calculation (min) 42 min    Activity Tolerance Patient tolerated treatment well    Behavior During Therapy [facility] for tasks assessed/performed              Past Medical History:  Diagnosis Date   Arthritis    Atrial fibrillation    Barrett esophagus    GERD (gastroesophageal reflux disease)    Hypertension    Past Surgical History:  Procedure Laterality Date   CARDIOVERSION     Patient Active Problem List   Diagnosis Date Noted   Acute left-sided low back pain with left-sided sciatica 12/04/2022   History of tobacco use 12/04/2022   Atrial fibrillation 03/08/2020   Paroxysmal atrial fibrillation 01/25/2020   Essential hypertension 01/25/2020   Educated about COVID-19 virus infection 01/25/2020   Atrial fibrillation with RVR 02/16/2012    PCP: Vance Peper, NP  REFERRING PROVIDER: Vance Peper, NP  REFERRING DIAG: acute LBP with L sciatica  Rationale for Evaluation and Treatment: Rehabilitation  THERAPY DIAG:  Acute midline low back pain with left-sided sciatica  Hip joint stiffness, left  ONSET DATE: 2 months, 10/19/22  SUBJECTIVE:                                                                                                                                                                                           SUBJECTIVE STATEMENT: Patient reports that his pain is continuing. Sitting is what really aggravates the pain, causing it to go down the back of his leg.  PERTINENT HISTORY:  Gradual onset of L gluteal pain and lower back pain  PAIN:  Are you having pain? Yes: NPRS scale: 8/10 Pain location: L lower back and gluts Pain  description: deep pain hurts progressively worse with sitting Aggravating factors: sitting prolonged Relieving factors: standing walking  PRECAUTIONS: None  WEIGHT BEARING RESTRICTIONS: No  FALLS:  Has patient fallen in last 6 months? No  LIVING ENVIRONMENT: Lives with: lives with their spouse Lives in: House/apartment Stairs: Yes: External: 3 steps; on right going up Has following equipment at home: None  OCCUPATION: works in Proofreader with loading/ unloading, Medical laboratory scientific officer  PLOF: Independent  PATIENT GOALS: get rid of this pain so that I can drive places  NEXT MD  VISIT: unkown  OBJECTIVE:   DIAGNOSTIC FINDINGS:  01/01/23:  MRI results: IMPRESSION: Mild multilevel degenerative disc disease, worst at L5-S1.  PATIENT SURVEYS:  FOTO 74.1%  SCREENING FOR RED FLAGS: Bowel or bladder incontinence: No Spinal tumors: No Cauda equina syndrome: No Compression fracture: No Abdominal aneurysm: No  COGNITION: Overall cognitive status: Within functional limits for tasks assessed     SENSATION: WFL  MUSCLE LENGTH: Thomas test: Right nt deg; Left wfl deg  POSTURE: decreased lumbar lordosis  PALPATION: Point tender L lumbar paraspinals, multifidus, L 3 to S1 levels Segmental mobility PA lumbar, + pain L 3 to S 1  LUMBAR ROM:   AROM eval  Flexion Wfl, p! L buttock  Extension 25%  Right lateral flexion wfl  Left lateral flexion 50% P!  Right rotation wfl  Left rotation wfl   (Blank rows = not tested  LOWER EXTREMITY ROM:     AAROM Right eval Left eval  Hip flexion 115 100  Hip extension wfl   Hip abduction    Hip adduction    Hip internal rotation    Hip external rotation  15  Knee flexion    Knee extension    Ankle dorsiflexion    Ankle plantarflexion    Ankle inversion    Ankle eversion     (Blank rows = not tested)  LOWER EXTREMITY MMT:    MMT Right eval Left eval  Hip flexion wfl wfl  Hip extension    Hip abduction wfl wfl  Hip  adduction    Hip internal rotation    Hip external rotation    Knee flexion    Knee extension wfl wfl  Ankle dorsiflexion heel walking wfl wfl  Ankle plantarflexion toe walking wfl wfl  Ankle inversion    Ankle eversion     (Blank rows = not tested)  FABER test: Positive, and Thomas test: Negative  LUMBAR SPECIAL TESTS:  Straight leg raise test:L Positive, Slump test: Positive B, Fabers + L  GAIT: Distance walked: in clinic 95' Assistive device utilized: None Level of assistance: Complete Independence Comments: no deficits  TODAY'S TREATMENT:                                                                                                                              DATE:  01/21/23 NuStep L 5 x 6 minutes Deep STM to L gluts, piriformis, lumbar paraspinals, and medial HS origin using Theragun and tennis ball. Supine PROM/stretch-figure 4, SKTC, HS stretch with strap Strengthening-isometric hip abd/add 5 sec x , Straight leg bridge, sit <> stand with G tband resistance at knees for abd, 10 reps each Traction at 80# x 12 minutes, with CP to L gluts and piriformis for inflammation.  01/18/23:   Reassessed lumbar motion, FB, SB, ext wnl. FB with L post thigh Sx.  Supine SLR - R, + L.  Proceeded with the following interventions:  Mechanical traction: Supine for pelvic traction,80#  max, 40 #min,  intermittent 60 on/20 off, for 15 min with moist heat lower back to improve joint spacing and soft tissue extensibility lumbar region, prior to therex.   Manual:   Dry needling:  DN L L4/5 multifidus, L piriformis, L glut maximus, all with twitch response .   Prone for light manual resistance for L hip IR/ER through available ROM, 8 reps.  01/13/23 Mechanical traction: Supine for pelvic traction,80# max, 40 #min,  intermittent 60 on/20 off, for 15 min with moist heat lower back to improve joint spacing and soft tissue extensibility lumbar region, prior to therex.   Manual: dry needling L  piriformis, L L5 multifidus, L gluteus medius, with good twitch response and palpable, visible muscle relaxation noted. The pt was informed of possible muscle soreness, bruising after this technique  Prone for light resisted L hip IR and ER through ROM, pain free, 10 reps  Prone press ups with therapist providing manual PA pressure L SI jt 10 reps, 5 sec holds  01/11/23: Mechanical traction: Supine for pelvic traction,80# max, 40 #min,  intermittent 60 on/20 off, for 15 min with moist heat lower back to improve joint spacing and soft tissue extensibility lumbar region, prior to therex.   Therapeutic exercise: Supine LTR 10 x Bridging 10 x Prone props on elbows  10 x 5 sec holds  Manual: Therapist provided L PSIS distraction/ PA glide while patient performing lumbar press ups.  10 x  L sidelying, for muscle energy to improve flexion, rotation, SB restriction L lumbar region, 2 bouts 6 reps each Provocative sign: L hip ER painful initially, improved to pain free after completion of therex and manual techniques  Therapeutic exercise:  01/07/23 Nustep L5 x 6 min IASTM to L gluts, piriformis, lumbar paraspinals. Supine stretching-Figure 4, glut stretch, 3 way stretch with strap-HS, ABD/ADD, LTR Supine clamshells, PPT, bridge x 10 reps each- TC to correctly perform  pelvic tilt. Seated pelvic tilts Standing hip abd with UE support against G tband resistance at knees, 10 each CP to L gluts for inflammation.  01/04/23 eval , side lying R for muscle energy for  lumbar facets, to improve extension and side bending mobility Side lying L for muscle energy to correct for L lumbar flex, rot. SB restriction, 2 sets 5 reps each Therex:  Instructed in prone props  Instructed in standing L sided lumbar facet joint flex, R side bending, R rotation mobility   PATIENT EDUCATION:  Education details: POC, goals, expectations Person educated: Patient Education method: Explanation and  Demonstration Education comprehension: verbalized understanding, tactile cues required, and needs further education  HOME EXERCISE PROGRAM: Access Code: [email or web address] URL: [email or web address] Date: 01/04/2023 Prepared by: Warren Lacy Speaks  Exercises - Prone Press Up  - 1 x daily - 7 x weekly - 3 sets - 10 reps - Prone Press Up On Elbows  - 1 x daily - 7 x weekly - 3 sets - 10 reps  ASSESSMENT:  CLINICAL IMPRESSION: Patient reports continued pain in L hip, radiating into his L post leg. It is worst when he has to sit for any period of time. Therapist provided Deep tissue massage to L gluts, piriformis, med HS origin F/B stretch to same. Then moved to strengthening for each muscle group and finished with traction and CP to L hip. He tolerated all well. Educated him to using a tennis ball for self massage and updated HEP to include the stretch and strengthening.  OBJECTIVE IMPAIRMENTS: decreased activity tolerance, decreased mobility,  decreased ROM, hypomobility, impaired perceived functional ability, impaired flexibility, and pain.   ACTIVITY LIMITATIONS: sitting and squatting  PARTICIPATION LIMITATIONS: driving, community activity, and yard work  PERSONAL FACTORS: Time since onset of injury/illness/exacerbation and 1 comorbidity: HTN  are also affecting patient's functional outcome.   REHAB POTENTIAL: Good  CLINICAL DECISION MAKING: Stable/uncomplicated  EVALUATION COMPLEXITY: Low   GOALS: Goals reviewed with patient? Yes  SHORT TERM GOALS: Target date: 01/18/23  I HEP  Baseline:established today Goal status: 01/07/23 HEP provided.   LONG TERM GOALS: Target date: 03/29/23  FOTO 82% Baseline: 74% Goal status: IN PROGRESS  2.  Improve L hip ROM for flexion to 110, and Faber's to wfl and pain free for better sitting tolerance Baseline: L hp flexion 105, Faber's painful Goal status: 01/21/23 ongoing  3.  Able to tolerate sitting for greater than 1 hour without lower  back and L gluteal pain so that he may drive for longer periods  Baseline: pain at 10 min Goal status: 01/21/23-ongoing   PLAN:  PT FREQUENCY: 1-2x/week  PT DURATION: 12 weeks  PLANNED INTERVENTIONS: Therapeutic exercises, Therapeutic activity, Neuromuscular re-education, Balance training, Gait training, Patient/Family education, Self Care, and Joint mobilization.  PLAN FOR NEXT SESSION: how was dry needling, review whether  traction still indicated, manual stretching, muscle energy, therex  Ethel Rana DPT 01/21/23 9:24 AM

## 2023-01-25 ENCOUNTER — Ambulatory Visit: Payer: Medicare Other

## 2023-01-25 ENCOUNTER — Other Ambulatory Visit: Payer: Self-pay

## 2023-01-25 DIAGNOSIS — M25652 Stiffness of left hip, not elsewhere classified: Secondary | ICD-10-CM

## 2023-01-25 DIAGNOSIS — M5442 Lumbago with sciatica, left side: Secondary | ICD-10-CM | POA: Diagnosis not present

## 2023-01-25 NOTE — Therapy (Signed)
OUTPATIENT PHYSICAL THERAPY THORACOLUMBAR TREATMENT   Patient Name: Scott Townsend MRN: [id number] DOB:[birth year], 67 y.o., male Today's Date: 01/25/2023  END OF SESSION:  PT End of Session - 01/25/23 0843     Visit Number 7    Date for PT Re-Evaluation 03/29/23    PT Start Time 0843    PT Stop Time 0926    PT Time Calculation (min) 43 min    Activity Tolerance Patient tolerated treatment well;Other (comment)    Behavior During Therapy WFL for tasks assessed/performed              Past Medical History:  Diagnosis Date   Arthritis    Atrial fibrillation    Barrett esophagus    GERD (gastroesophageal reflux disease)    Hypertension    Past Surgical History:  Procedure Laterality Date   CARDIOVERSION     Patient Active Problem List   Diagnosis Date Noted   Acute left-sided low back pain with left-sided sciatica 12/04/2022   History of tobacco use 12/04/2022   Atrial fibrillation 03/08/2020   Paroxysmal atrial fibrillation 01/25/2020   Essential hypertension 01/25/2020   Educated about COVID-19 virus infection 01/25/2020   Atrial fibrillation with RVR 02/16/2012    PCP: Rodman Pickle, NP  REFERRING PROVIDER: Rodman Pickle, NP  REFERRING DIAG: acute LBP with L sciatica  Rationale for Evaluation and Treatment: Rehabilitation  THERAPY DIAG:  Acute midline low back pain with left-sided sciatica  Hip joint stiffness, left  ONSET DATE: 2 months, 10/19/22  SUBJECTIVE:                                                                                                                                                                                           SUBJECTIVE STATEMENT: rates overall improvement in Sx at 35 %.  Didn't hurt to drive in today but drove to race this weekend with sons and hurt PERTINENT HISTORY:  Gradual onset of L gluteal pain and lower back pain  PAIN:  Are you having pain? Yes: NPRS scale: 8/10 Pain location: L lower back and  gluts Pain description: deep pain hurts progressively worse with sitting Aggravating factors: sitting prolonged Relieving factors: standing walking  PRECAUTIONS: None  WEIGHT BEARING RESTRICTIONS: No  FALLS:  Has patient fallen in last 6 months? 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  PATIENT SURVEYS:  FOTO 74.1%  SCREENING FOR RED FLAGS: Bowel or bladder incontinence: No Spinal tumors: No Cauda equina syndrome: No Compression fracture: No Abdominal aneurysm: No  COGNITION: Overall cognitive status: Within functional limits for tasks assessed     SENSATION: WFL  MUSCLE LENGTH: Thomas test: Right nt deg; Left wfl deg  POSTURE: decreased lumbar lordosis  PALPATION: Point tender L lumbar paraspinals, multifidus, L 3 to S1 levels Segmental mobility PA lumbar, + pain L 3 to S 1  LUMBAR ROM:   AROM eval  Flexion Wfl, p! L buttock  Extension 25%  Right lateral flexion wfl  Left lateral flexion 50% P!  Right rotation wfl  Left rotation wfl   (Blank rows = not tested  LOWER EXTREMITY ROM:     AAROM Right eval Left eval  Hip flexion 115 100  Hip extension wfl   Hip abduction    Hip adduction    Hip internal rotation    Hip external rotation  15  Knee flexion    Knee extension    Ankle dorsiflexion    Ankle plantarflexion    Ankle inversion    Ankle eversion     (Blank rows = not tested)  LOWER EXTREMITY MMT:    MMT Right eval Left eval  Hip flexion wfl wfl  Hip extension    Hip abduction wfl  wfl  Hip adduction    Hip internal rotation    Hip external rotation    Knee flexion    Knee extension wfl wfl  Ankle dorsiflexion heel walking wfl wfl  Ankle plantarflexion toe walking wfl wfl  Ankle inversion    Ankle eversion     (Blank rows = not tested)  FABER test: Positive, and Thomas test: Negative  LUMBAR SPECIAL TESTS:  Straight leg raise test:L Positive, Slump test: Positive B, Fabers + L  GAIT: Distance walked: in clinic 6275' Assistive device utilized: None Level of assistance: Complete Independence Comments: no deficits  TODAY'S TREATMENT:                                                                                                                              DATE:  01/25/23: Manual: Deep STM to L gluts, piriformis, lumbar paraspinals, and medial HS origin using Theragun  Dry needling :Trigger Point Dry-Needling  Treatment instructions: Expect mild to moderate muscle soreness. S/S of pneumothorax if dry needled over a lung field, and to seek immediate medical attention should they occur. Patient verbalized understanding of these instructions and education. Patient Consent Given: Yes Education handout provided: No Muscles treated: L gluteus maximus, L gluteus minimus, L L 4/5 multifidus Electrical stimulation performed: No Parameters: N/A Treatment response/outcome: Twitch Response Elicited and Palpable Increase in Muscle Length  Therex:  supine for unilateral bridges L LE 15x Supine for  hook lying hip abd/ER with belt for isometric cxn 15x Stretching L hp into Fabere's position with therapist's assistance positioning hip    01/21/23 NuStep L 5 x 6 minutes Deep STM to L gluts, piriformis, lumbar paraspinals, and medial HS origin using Theragun and tennis ball. Supine PROM/stretch-figure 4, SKTC, HS stretch with strap Strengthening-isometric hip abd/add 5 sec x , Straight leg bridge, sit <> stand with G tband resistance at knees for abd, 10 reps each Traction at  80# x 12 minutes, with CP to L gluts and piriformis for inflammation.  01/18/23:   Reassessed lumbar motion, FB, SB, ext wnl. FB with L post thigh Sx.  Supine SLR - R, + L.  Proceeded with the following interventions:  Mechanical traction: Supine for pelvic traction,80# max, 40 #min,  intermittent 60 on/20 off, for 15 min with moist heat lower back to improve joint spacing and soft tissue extensibility lumbar region, prior to therex.   Manual:   Dry needling:  DN L L4/5 multifidus, L piriformis, L glut maximus, all with twitch response .   Prone for light manual resistance for L hip IR/ER through available ROM, 8 reps.  01/13/23 Mechanical traction: Supine for pelvic traction,80# max, 40 #min,  intermittent 60 on/20 off, for 15 min with moist heat lower back to improve joint spacing and soft tissue extensibility lumbar region, prior to therex.   Manual: dry needling L piriformis, L L5 multifidus, L gluteus medius, with good twitch response and palpable, visible muscle relaxation noted. The pt was informed of possible muscle soreness, bruising after this technique  Prone for light resisted L hip IR and ER through ROM, pain free, 10 reps  Prone press ups with therapist providing manual PA pressure L SI jt 10 reps, 5 sec holds  01/11/23: Mechanical traction: Supine for pelvic traction,80# max, 40 #min,  intermittent 60 on/20 off, for 15 min with moist heat lower back to improve joint spacing and soft tissue extensibility lumbar region, prior to therex.   Therapeutic exercise: Supine LTR 10 x Bridging 10 x Prone props on elbows  10 x 5 sec holds  Manual: Therapist provided L PSIS distraction/ PA glide while patient performing lumbar press ups.  10 x  L sidelying, for muscle energy to improve flexion, rotation, SB restriction L lumbar region, 2 bouts 6 reps each Provocative sign: L hip ER painful initially, improved to pain free after completion of therex and manual techniques  Therapeutic  exercise:  01/07/23 Nustep L5 x 6 min IASTM to L gluts, piriformis, lumbar paraspinals. Supine stretching-Figure 4, glut stretch, 3 way stretch with strap-HS, ABD/ADD, LTR Supine clamshells, PPT, bridge x 10 reps each- TC to correctly perform  pelvic tilt. Seated pelvic tilts Standing hip abd with UE support against G tband resistance at knees, 10 each CP to L gluts for inflammation.  01/04/23 eval , side lying R for muscle energy for  lumbar facets, to improve extension and side bending mobility Side lying L for muscle energy to correct for L lumbar flex, rot. SB restriction, 2 sets 5 reps each Therex:  Instructed in prone props  Instructed in standing L sided lumbar facet joint flex, R side bending, R rotation mobility   PATIENT EDUCATION:  Education details: POC, goals, expectations Person educated: Patient Education method: Explanation and Demonstration Education comprehension: verbalized understanding, tactile cues required, and needs further education  HOME EXERCISE PROGRAM: Access Code: [email or web address] URL: [email or web address] Date: 01/04/2023 Prepared by: Caralee Ates  Exercises - Prone Press  Up  - 1 x daily - 7 x weekly - 3 sets - 10 reps - Prone Press Up On Elbows  - 1 x daily - 7 x weekly - 3 sets - 10 reps  ASSESSMENT:  CLINICAL IMPRESSION: Patient reports somewhat improved L post hip pain although continues with sitting.  Today deferred pelvic traction and instead addressed pain and discomfort in soft tissue L lumbar and pot hip.  He did have an episode of light headedness, in standing today, between manual techniques and the ex.  Assessed his BP and it was 153/94, patient rested in supine and BP then 150/90.  Determined not to utilize the Nustep or vigorous strengthening due to elevated BP and due to his near syncope.  He is to follow with his cardiologist.  Will assess how he does without traction at next visit.  OBJECTIVE IMPAIRMENTS: decreased activity  tolerance, decreased mobility, decreased ROM, hypomobility, impaired perceived functional ability, impaired flexibility, and pain.   ACTIVITY LIMITATIONS: sitting and squatting  PARTICIPATION LIMITATIONS: driving, community activity, and yard work  PERSONAL FACTORS: Time since onset of injury/illness/exacerbation and 1 comorbidity: HTN  are also affecting patient's functional outcome.   REHAB POTENTIAL: Good  CLINICAL DECISION MAKING: Stable/uncomplicated  EVALUATION COMPLEXITY: Low   GOALS: Goals reviewed with patient? Yes  SHORT TERM GOALS: Target date: 01/18/23  I HEP  Baseline:established today Goal status: 01/07/23 HEP provided.   LONG TERM GOALS: Target date: 03/29/23  FOTO 82% Baseline: 74% Goal status: IN PROGRESS  2.  Improve L hip ROM for flexion to 110, and Faber's to wfl and pain free for better sitting tolerance Baseline: L hp flexion 105, Faber's painful Goal status: 01/21/23 ongoing  3.  Able to tolerate sitting for greater than 1 hour without lower back and L gluteal pain so that he may drive for longer periods  Baseline: pain at 10 min Goal status: 01/21/23-ongoing   PLAN:  PT FREQUENCY: 1-2x/week  PT DURATION: 12 weeks  PLANNED INTERVENTIONS: Therapeutic exercises, Therapeutic activity, Neuromuscular re-education, Balance training, Gait training, Patient/Family education, Self Care, and Joint mobilization.  PLAN FOR NEXT SESSION: how was dry needling, review whether  traction still indicated, manual stretching, muscle energy, therex  Martisha Toulouse, PT 01/25/23 12:53 PM

## 2023-01-28 ENCOUNTER — Encounter: Payer: Self-pay | Admitting: Physical Therapy

## 2023-01-28 ENCOUNTER — Ambulatory Visit: Payer: Medicare Other | Admitting: Physical Therapy

## 2023-01-28 DIAGNOSIS — M25652 Stiffness of left hip, not elsewhere classified: Secondary | ICD-10-CM

## 2023-01-28 DIAGNOSIS — M5442 Lumbago with sciatica, left side: Secondary | ICD-10-CM | POA: Diagnosis not present

## 2023-01-28 NOTE — Therapy (Signed)
OUTPATIENT PHYSICAL THERAPY THORACOLUMBAR TREATMENT   Patient Name: Scott Townsend MRN: [id number] DOB:1956-09-11, 67 y.o., male Today's Date: 01/28/2023  END OF SESSION:  PT End of Session - 01/28/23 0757     Visit Number 8    Date for PT Re-Evaluation 03/29/23    PT Start Time 0755    PT Stop Time 0835    PT Time Calculation (min) 40 min    Activity Tolerance Patient tolerated treatment well;Other (comment)    Behavior During Therapy WFL for tasks assessed/performed              Past Medical History:  Diagnosis Date   Arthritis    Atrial fibrillation    Barrett esophagus    GERD (gastroesophageal reflux disease)    Hypertension    Past Surgical History:  Procedure Laterality Date   CARDIOVERSION     Patient Active Problem List   Diagnosis Date Noted   Acute left-sided low back pain with left-sided sciatica 12/04/2022   History of tobacco use 12/04/2022   Atrial fibrillation 03/08/2020   Paroxysmal atrial fibrillation 01/25/2020   Essential hypertension 01/25/2020   Educated about COVID-19 virus infection 01/25/2020   Atrial fibrillation with RVR 02/16/2012    PCP: Rodman Pickle, NP  REFERRING PROVIDER: Rodman Pickle, NP  REFERRING DIAG: acute LBP with L sciatica  Rationale for Evaluation and Treatment: Rehabilitation  THERAPY DIAG:  Acute midline low back pain with left-sided sciatica  Hip joint stiffness, left  ONSET DATE: 2 months, 10/19/22  SUBJECTIVE:                                                                                                                                                                                           SUBJECTIVE STATEMENT: rates overall improvement in Sx at 35 %.  Didn't hurt to drive in today but drove to race this weekend with sons and hurt PERTINENT HISTORY:  Gradual onset of L gluteal pain and lower back pain  PAIN:  Are you having pain? Yes: NPRS scale: 8/10 Pain location: L lower back and  gluts Pain description: deep pain hurts progressively worse with sitting Aggravating factors: sitting prolonged Relieving factors: standing walking  PRECAUTIONS: None  WEIGHT BEARING RESTRICTIONS: No  FALLS:  Has patient fallen in last 6 months? 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  PATIENT SURVEYS:  FOTO 74.1%  SCREENING FOR RED FLAGS: Bowel or bladder incontinence: No Spinal tumors: No Cauda equina syndrome: No Compression fracture: No Abdominal aneurysm: No  COGNITION: Overall cognitive status: Within functional limits for tasks assessed     SENSATION: WFL  MUSCLE LENGTH: Thomas test: Right nt deg; Left wfl deg  POSTURE: decreased lumbar lordosis  PALPATION: Point tender L lumbar paraspinals, multifidus, L 3 to S1 levels Segmental mobility PA lumbar, + pain L 3 to S 1  LUMBAR ROM:   AROM eval  Flexion Wfl, p! L buttock  Extension 25%  Right lateral flexion wfl  Left lateral flexion 50% P!  Right rotation wfl  Left rotation wfl   (Blank rows = not tested  LOWER EXTREMITY ROM:     AAROM Right eval Left eval  Hip flexion 115 100  Hip extension wfl   Hip abduction    Hip adduction    Hip internal rotation    Hip external rotation  15  Knee flexion    Knee extension    Ankle dorsiflexion    Ankle plantarflexion    Ankle inversion    Ankle eversion     (Blank rows = not tested)  LOWER EXTREMITY MMT:    MMT Right eval Left eval  Hip flexion wfl wfl  Hip extension    Hip abduction wfl  wfl  Hip adduction    Hip internal rotation    Hip external rotation    Knee flexion    Knee extension wfl wfl  Ankle dorsiflexion heel walking wfl wfl  Ankle plantarflexion toe walking wfl wfl  Ankle inversion    Ankle eversion     (Blank rows = not tested)  FABER test: Positive, and Thomas test: Negative  LUMBAR SPECIAL TESTS:  Straight leg raise test:L Positive, Slump test: Positive B, Fabers + L  GAIT: Distance walked: in clinic 66' Assistive device utilized: None Level of assistance: Complete Independence Comments: no deficits  TODAY'S TREATMENT:                                                                                                                              DATE:  01/28/23 Dry Needling performed by Otelia Santee, PT-Multifidi, Glut max, Glut med on L. No issues with BP following. NuStep L5 x 5 minutes BP 137/86 HR 76 Deep STM to L multifidi, Glut max, Glut med, piriformis Stretch AAROM for same muscle groups. Supine isometric hip abd/add 5 sec x 10 reps. Seated rotational weight shifts with B hands on mat, 5 reps to each side  Standing dead lifts to 18" target with 5# weight, 10 reps Attemtped SL dead lift, too difficult Lunges x 10 with each leg, mod VC for posture.  01/25/23: Manual: Deep STM to L gluts, piriformis, lumbar paraspinals, and  medial HS origin using Theragun  Dry needling :Trigger Point Dry-Needling  Treatment instructions: Expect mild to moderate muscle soreness. S/S of pneumothorax if dry needled over a lung field, and to seek immediate medical attention should they occur. Patient verbalized understanding of these instructions and education. Patient Consent Given: Yes Education handout provided: No Muscles treated: L gluteus maximus, L gluteus minimus, L L 4/5 multifidus Electrical stimulation performed: No Parameters: N/A Treatment response/outcome: Twitch Response Elicited and Palpable Increase in Muscle Length  Therex:  supine for  unilateral bridges L LE 15x Supine for hook lying hip abd/ER with belt for isometric cxn 15x Stretching L hp into Fabere's position with therapist's assistance positioning hip   01/21/23 NuStep L 5 x 6 minutes Deep STM to L gluts, piriformis, lumbar paraspinals, and medial HS origin using Theragun and tennis ball. Supine PROM/stretch-figure 4, SKTC, HS stretch with strap Strengthening-isometric hip abd/add 5 sec x , Straight leg bridge, sit <> stand with G tband resistance at knees for abd, 10 reps each Traction at 80# x 12 minutes, with CP to L gluts and piriformis for inflammation.  01/18/23:   Reassessed lumbar motion, FB, SB, ext wnl. FB with L post thigh Sx.  Supine SLR - R, + L.  Proceeded with the following interventions:  Mechanical traction: Supine for pelvic traction,80# max, 40 #min,  intermittent 60 on/20 off, for 15 min with moist heat lower back to improve joint spacing and soft tissue extensibility lumbar region, prior to therex.   Manual:   Dry needling:  DN L L4/5 multifidus, L piriformis, L glut maximus, all with twitch response .   Prone for light manual resistance for L hip IR/ER through available ROM, 8 reps.  01/13/23 Mechanical traction: Supine for pelvic traction,80# max, 40 #min,  intermittent 60 on/20 off, for 15 min with moist heat lower back to improve joint spacing and soft tissue extensibility lumbar region, prior to therex.   Manual: dry needling L piriformis, L L5 multifidus, L gluteus medius, with good twitch response and palpable, visible muscle relaxation noted. The pt was informed of possible muscle soreness, bruising after this technique  Prone for light resisted L hip IR and ER through ROM, pain free, 10 reps  Prone press ups with therapist providing manual PA pressure L SI jt 10 reps, 5 sec holds  01/11/23: Mechanical traction: Supine for pelvic traction,80# max, 40 #min,  intermittent 60 on/20 off, for 15 min with moist heat lower back to improve joint  spacing and soft tissue extensibility lumbar region, prior to therex.   Therapeutic exercise: Supine LTR 10 x Bridging 10 x Prone props on elbows  10 x 5 sec holds  Manual: Therapist provided L PSIS distraction/ PA glide while patient performing lumbar press ups.  10 x  L sidelying, for muscle energy to improve flexion, rotation, SB restriction L lumbar region, 2 bouts 6 reps each Provocative sign: L hip ER painful initially, improved to pain free after completion of therex and manual techniques  Therapeutic exercise:   PATIENT EDUCATION:  Education details: POC, goals, expectations Person educated: Patient Education method: Explanation and Demonstration Education comprehension: verbalized understanding, tactile cues required, and needs further education  HOME EXERCISE PROGRAM: Access Code: [email or web address] URL: [email or web address] Date: 01/04/2023 Prepared by: Amy Speaks  Exercises - Prone Press Up  - 1 x daily - 7 x weekly - 3 sets - 10 reps - Prone Press Up On Elbows  - 1 x daily - 7 x weekly -  3 sets - 10 reps  ASSESSMENT:  CLINICAL IMPRESSION: Patient reports continued improvement in his pain. Felt the best yet after Monday treatment. Progressed the treatment plan with STM, stretch and strengthening/stabilization for the muscle groups.  OBJECTIVE IMPAIRMENTS: decreased activity tolerance, decreased mobility, decreased ROM, hypomobility, impaired perceived functional ability, impaired flexibility, and pain.   ACTIVITY LIMITATIONS: sitting and squatting  PARTICIPATION LIMITATIONS: driving, community activity, and yard work  PERSONAL FACTORS: Time since onset of injury/illness/exacerbation and 1 comorbidity: HTN  are also affecting patient's functional outcome.   REHAB POTENTIAL: Good  CLINICAL DECISION MAKING: Stable/uncomplicated  EVALUATION COMPLEXITY: Low   GOALS: Goals reviewed with patient? Yes  SHORT TERM GOALS: Target date: 01/18/23  I HEP   Baseline:established today Goal status: 01/07/23 HEP provided.   LONG TERM GOALS: Target date: 03/29/23  FOTO 82% Baseline: 74% Goal status: IN PROGRESS  2.  Improve L hip ROM for flexion to 110, and Faber's to wfl and pain free for better sitting tolerance Baseline: L hp flexion 105, Faber's painful Goal status: 01/21/23 ongoing  3.  Able to tolerate sitting for greater than 1 hour without lower back and L gluteal pain so that he may drive for longer periods  Baseline: pain at 10 min Goal status: 01/21/23-ongoing  PLAN:  PT FREQUENCY: 1-2x/week  PT DURATION: 12 weeks  PLANNED INTERVENTIONS: Therapeutic exercises, Therapeutic activity, Neuromuscular re-education, Balance training, Gait training, Patient/Family education, Self Care, and Joint mobilization.  PLAN FOR NEXT SESSION: how was dry needling, review whether  traction still indicated, manual stretching, muscle energy, therex  Oley BalmSusan Dejour Vos DPT 01/28/23 8:41 AM

## 2023-02-01 ENCOUNTER — Other Ambulatory Visit: Payer: Self-pay | Admitting: Cardiology

## 2023-02-01 ENCOUNTER — Ambulatory Visit: Payer: Medicare Other

## 2023-02-01 ENCOUNTER — Other Ambulatory Visit: Payer: Self-pay

## 2023-02-01 DIAGNOSIS — M5442 Lumbago with sciatica, left side: Secondary | ICD-10-CM | POA: Diagnosis not present

## 2023-02-01 DIAGNOSIS — M25652 Stiffness of left hip, not elsewhere classified: Secondary | ICD-10-CM

## 2023-02-01 NOTE — Therapy (Signed)
OUTPATIENT PHYSICAL THERAPY THORACOLUMBAR TREATMENT   Patient Name: Scott Townsend MRN: [id number] DOB:[birth year], 67 y.o., male Today's Date: 02/01/2023  END OF SESSION:  PT End of Session - 02/01/23 0806     Visit Number 9    Date for PT Re-Evaluation 03/29/23    PT Start Time 0802    PT Stop Time 0845    PT Time Calculation (min) 43 min    Activity Tolerance Patient tolerated treatment well;Other (comment)    Behavior During Therapy WFL for tasks assessed/performed               Past Medical History:  Diagnosis Date   Arthritis    Atrial fibrillation    Barrett esophagus    GERD (gastroesophageal reflux disease)    Hypertension    Past Surgical History:  Procedure Laterality Date   CARDIOVERSION     Patient Active Problem List   Diagnosis Date Noted   Acute left-sided low back pain with left-sided sciatica 12/04/2022   History of tobacco use 12/04/2022   Atrial fibrillation 03/08/2020   Paroxysmal atrial fibrillation 01/25/2020   Essential hypertension 01/25/2020   Educated about COVID-19 virus infection 01/25/2020   Atrial fibrillation with RVR 02/16/2012    PCP: Rodman Pickle, NP  REFERRING PROVIDER: Rodman Pickle, NP  REFERRING DIAG: acute LBP with L sciatica  Rationale for Evaluation and Treatment: Rehabilitation  THERAPY DIAG:  Acute midline low back pain with left-sided sciatica  Hip joint stiffness, left  ONSET DATE: 2 months, 10/19/22  SUBJECTIVE:                                                                                                                                                                                           SUBJECTIVE STATEMENT: hurting today after drive to this appt.  Still some better but not consistently.  Intensity the same when pain occurs PERTINENT HISTORY:  Gradual onset of L gluteal pain and lower back pain  PAIN:  Are you having pain? Yes: NPRS scale: 8/10 Pain location: L lower back and  gluts Pain description: deep pain hurts progressively worse with sitting Aggravating factors: sitting prolonged Relieving factors: standing walking  PRECAUTIONS: None  WEIGHT BEARING RESTRICTIONS: No  FALLS:  Has patient fallen in last 6 months? 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  PATIENT SURVEYS:  FOTO 74.1%  SCREENING FOR RED FLAGS: Bowel or bladder incontinence: No Spinal tumors: No Cauda equina syndrome: No Compression fracture: No Abdominal aneurysm: No  COGNITION: Overall cognitive status: Within functional limits for tasks assessed     SENSATION: WFL  MUSCLE LENGTH: Thomas test: Right nt deg; Left wfl deg  POSTURE: decreased lumbar lordosis  PALPATION: Point tender L lumbar paraspinals, multifidus, L 3 to S1 levels Segmental mobility PA lumbar, + pain L 3 to S 1  LUMBAR ROM:   AROM eval  Flexion Wfl, p! L buttock  Extension 25%  Right lateral flexion wfl  Left lateral flexion 50% P!  Right rotation wfl  Left rotation wfl   (Blank rows = not tested  LOWER EXTREMITY ROM:     AAROM Right eval Left eval  Hip flexion 115 100  Hip extension wfl   Hip abduction    Hip adduction    Hip internal rotation    Hip external rotation  15  Knee flexion    Knee extension    Ankle dorsiflexion    Ankle plantarflexion    Ankle inversion    Ankle eversion     (Blank rows = not tested)  LOWER EXTREMITY MMT:    MMT Right eval Left eval  Hip flexion wfl wfl  Hip extension    Hip abduction wfl  wfl  Hip adduction    Hip internal rotation    Hip external rotation    Knee flexion    Knee extension wfl wfl  Ankle dorsiflexion heel walking wfl wfl  Ankle plantarflexion toe walking wfl wfl  Ankle inversion    Ankle eversion     (Blank rows = not tested)  FABER test: Positive, and Thomas test: Negative  LUMBAR SPECIAL TESTS:  Straight leg raise test:L Positive, Slump test: Positive B, Fabers + L  GAIT: Distance walked: in clinic 11' Assistive device utilized: None Level of assistance: Complete Independence Comments: no deficits  TODAY'S TREATMENT:                                                                                                                              DATE:  02/01/23: Therapeutic exercise: Nustep 6 min, level 5, UE's and LE's for tissue perfusion, increased motor recruitment gluteals Following traction, prone for manually resisted L hip IR/ER Manual stretching for L hip flexion, ER in supine, gentle  Supine for IPTx, 92 max, 40 min, for 15 min, with moist heat lumbar.    Massage gun with patient in R side lying over L piriformis, proximal hamstring attachments 01/28/23 Dry Needling performed by Otelia Santee, PT-Multifidi, Glut max, Glut med on L. No issues with BP following. NuStep L5 x 5 minutes BP 137/86 HR 76 Deep STM to L multifidi, Glut max, Glut med, piriformis Stretch AAROM for same  muscle groups. Supine isometric hip abd/add 5 sec x 10 reps. Seated rotational weight shifts with B hands on mat, 5 reps to each side  Standing dead lifts to 18" target with 5# weight, 10 reps Attemtped SL dead lift, too difficult Lunges x 10 with each leg, mod VC for posture.  01/25/23: Manual: Deep STM to L gluts, piriformis, lumbar paraspinals, and medial HS origin using Theragun  Dry needling :Trigger Point Dry-Needling  Treatment instructions: Expect mild to moderate muscle soreness. S/S of pneumothorax if dry needled over a lung field, and to seek immediate  medical attention should they occur. Patient verbalized understanding of these instructions and education. Patient Consent Given: Yes Education handout provided: No Muscles treated: L gluteus maximus, L gluteus minimus, L L 4/5 multifidus Electrical stimulation performed: No Parameters: N/A Treatment response/outcome: Twitch Response Elicited and Palpable Increase in Muscle Length  Therex:  supine for unilateral bridges L LE 15x Supine for hook lying hip abd/ER with belt for isometric cxn 15x Stretching L hp into Fabere's position with therapist's assistance positioning hip   01/21/23 NuStep L 5 x 6 minutes Deep STM to L gluts, piriformis, lumbar paraspinals, and medial HS origin using Theragun and tennis ball. Supine PROM/stretch-figure 4, SKTC, HS stretch with strap Strengthening-isometric hip abd/add 5 sec x , Straight leg bridge, sit <> stand with G tband resistance at knees for abd, 10 reps each Traction at 80# x 12 minutes, with CP to L gluts and piriformis for inflammation.  01/18/23:   Reassessed lumbar motion, FB, SB, ext wnl. FB with L post thigh Sx.  Supine SLR - R, + L.  Proceeded with the following interventions:  Mechanical traction: Supine for pelvic traction,80# max, 40 #min,  intermittent 60 on/20 off, for 15 min with moist heat lower back to improve joint spacing and soft tissue extensibility lumbar region, prior to therex.   Manual:   Dry needling:  DN L L4/5 multifidus, L piriformis, L glut maximus, all with twitch response .   Prone for light manual resistance for L hip IR/ER through available ROM, 8 reps.  01/13/23 Mechanical traction: Supine for pelvic traction,80# max, 40 #min,  intermittent 60 on/20 off, for 15 min with moist heat lower back to improve joint spacing and soft tissue extensibility lumbar region, prior to therex.   Manual: dry needling L piriformis, L L5 multifidus, L gluteus medius, with good twitch response and palpable, visible muscle relaxation  noted. The pt was informed of possible muscle soreness, bruising after this technique  Prone for light resisted L hip IR and ER through ROM, pain free, 10 reps  Prone press ups with therapist providing manual PA pressure L SI jt 10 reps, 5 sec holds  01/11/23: Mechanical traction: Supine for pelvic traction,80# max, 40 #min,  intermittent 60 on/20 off, for 15 min with moist heat lower back to improve joint spacing and soft tissue extensibility lumbar region, prior to therex.   Therapeutic exercise: Supine LTR 10 x Bridging 10 x Prone props on elbows  10 x 5 sec holds  Manual: Therapist provided L PSIS distraction/ PA glide while patient performing lumbar press ups.  10 x  L sidelying, for muscle energy to improve flexion, rotation, SB restriction L lumbar region, 2 bouts 6 reps each Provocative sign: L hip ER painful initially, improved to pain free after completion of therex and manual techniques  Therapeutic exercise:   PATIENT EDUCATION:  Education details: POC, goals, expectations Person educated: Patient Education method: Explanation  and Demonstration Education comprehension: verbalized understanding, tactile cues required, and needs further education  HOME EXERCISE PROGRAM: Access Code: [email or web address] URL: [email or web address] Date: 01/04/2023 Prepared by: Linton Rump Kima Malenfant  Exercises - Prone Press Up  - 1 x daily - 7 x weekly - 3 sets - 10 reps - Prone Press Up On Elbows  - 1 x daily - 7 x weekly - 3 sets - 10 reps  ASSESSMENT:  CLINICAL IMPRESSION: 9th skilled PT visit today to address his L post hip radiculopathy.  Minimal improvement, slight decline in FOTO. Still painful Faber L.  Has good concept of therex.  At this point referred him back to his referring clinician for additional follow up with spine center or for injections.PT sent note to PCP Rodman Pickle.  Will DC at this time.   OBJECTIVE IMPAIRMENTS: decreased activity tolerance, decreased mobility,  decreased ROM, hypomobility, impaired perceived functional ability, impaired flexibility, and pain.   ACTIVITY LIMITATIONS: sitting and squatting  PARTICIPATION LIMITATIONS: driving, community activity, and yard work  PERSONAL FACTORS: Time since onset of injury/illness/exacerbation and 1 comorbidity: HTN  are also affecting patient's functional outcome.   REHAB POTENTIAL: Good  CLINICAL DECISION MAKING: Stable/uncomplicated  EVALUATION COMPLEXITY: Low   GOALS: Goals reviewed with patient? Yes  SHORT TERM GOALS: Target date: 01/18/23  I HEP  Baseline:established today Goal status: 01/07/23 HEP provided.   LONG TERM GOALS: Target date: 03/29/23  FOTO 82% Baseline: 74% Goal status: IN PROGRESS 02/01/23: 62% declined since eval  2.  Improve L hip ROM for flexion to 110, and Faber's to wfl and pain free for better sitting tolerance Baseline: L hp flexion 105, Faber's painful Goal status: 01/21/23 ongoing 02/01/23: L hip flexion ROM 110, Faber's still painful/restricted   3.  Able to tolerate sitting for greater than 1 hour without lower back and L gluteal pain so that he may drive for longer periods  Baseline: pain at 10 min Goal status: 01/21/23-ongoing 02/01/23:  pain L post hip and lower back after 15 min  PLAN:  PT FREQUENCY: 1-2x/week  PT DURATION: 12 weeks  PLANNED INTERVENTIONS:  PLAN FOR NEXT SESSION: dc today  02/01/23 10:48 AM+

## 2023-02-03 ENCOUNTER — Ambulatory Visit: Payer: Medicare Other

## 2023-02-05 ENCOUNTER — Encounter: Payer: Self-pay | Admitting: Nurse Practitioner

## 2023-02-05 ENCOUNTER — Ambulatory Visit (INDEPENDENT_AMBULATORY_CARE_PROVIDER_SITE_OTHER): Payer: Medicare Other | Admitting: Nurse Practitioner

## 2023-02-05 VITALS — BP 128/70 | HR 69 | Temp 97.4°F | Ht 70.0 in | Wt 227.4 lb

## 2023-02-05 DIAGNOSIS — M5442 Lumbago with sciatica, left side: Secondary | ICD-10-CM | POA: Diagnosis not present

## 2023-02-05 DIAGNOSIS — G8929 Other chronic pain: Secondary | ICD-10-CM

## 2023-02-05 MED ORDER — GABAPENTIN 300 MG PO CAPS: 300.0000 mg | ORAL_CAPSULE | Freq: Three times a day (TID) | ORAL | 1 refills | Status: DC

## 2023-02-05 NOTE — Progress Notes (Signed)
   Established Patient Office Visit  Subjective   Patient ID: Scott Townsend, male    DOB: 02-26-1956  Age: 67 y.o. MRN: [id number]  Chief Complaint  Patient presents with   Back Pain    Follow up    HPI  Scott Townsend is here to follow-up on chronic back pain.  He has gone to physical therapy for 16 visits and this has not improved his pain.  His gabapentin was increased to 300 mg 3 times daily and he states that this has significantly helped his pain.  He still has the pain worsen when he sits for too long and will radiate down his left leg.  He denies fevers, urinary incontinence.    ROS See pertinent positives and negatives per HPI.    Objective:     BP 128/70 (BP Location: Right Arm)   Pulse 69   Temp (!) 97.4 F (36.3 C)   Ht  (1.778 m)   Wt 227 lb 6.4 oz (103.1 kg)   SpO2 96%   BMI 32.63 kg/m    Physical Exam Vitals and nursing note reviewed.  Constitutional:      Appearance: Normal appearance.  HENT:     Head: Normocephalic.  Eyes:     Conjunctiva/sclera: Conjunctivae normal.  Cardiovascular:     Rate and Rhythm: Normal rate and regular rhythm.     Pulses: Normal pulses.     Heart sounds: Normal heart sounds.  Pulmonary:     Effort: Pulmonary effort is normal.     Breath sounds: Normal breath sounds.  Musculoskeletal:        General: Tenderness (Left lower back) present.     Cervical back: Normal range of motion.  Skin:    General: Skin is warm.  Neurological:     General: No focal deficit present.     Mental Status: He is alert and oriented to person, place, and time.  Psychiatric:        Mood and Affect: Mood normal.        Behavior: Behavior normal.        Thought Content: Thought content normal.        Judgment: Judgment normal.    The 10-year ASCVD risk score (Arnett DK, et al., 2019) is: 12%    Assessment & Plan:   Problem List Items Addressed This Visit       Nervous and Auditory   Acute left-sided low back pain  with left-sided sciatica - Primary    Chronic, improving.  Pain has improved since increasing gabapentin to 300 mg 3 times daily.  Will continue this regimen and refill sent to the pharmacy.  MRI did showed lumbar degenerative disc disease and mild stenosis.  Will place referral to neurosurgery.  Follow-up in 3 months or sooner with concerns.      Relevant Medications   gabapentin (NEURONTIN) 300 MG capsule    Return in about 3 months (around 05/07/2023) for back pain.    [name], NP

## 2023-02-05 NOTE — Assessment & Plan Note (Signed)
Chronic, improving.  Pain has improved since increasing gabapentin to 300 mg 3 times daily.  Will continue this regimen and refill sent to the pharmacy.  MRI did showed lumbar degenerative disc disease and mild stenosis.  Will place referral to neurosurgery.  Follow-up in 3 months or sooner with concerns.

## 2023-02-05 NOTE — Patient Instructions (Addendum)
It was great to see you!  I have placed a referral to neurosurgery, they will call to schedule   Keep taking the gabapentin 3 times a day, I have sent a refill.   Let's follow-up in 3 months, sooner if you have concerns.  If a referral was placed today, you will be contacted for an appointment. Please note that routine referrals can sometimes take up to 3-4 weeks to process. Please call our office if you haven't heard anything after this time frame.  Take care,  Rodman Pickle, NP

## 2023-02-23 ENCOUNTER — Encounter: Payer: Self-pay | Admitting: Cardiology

## 2023-02-23 ENCOUNTER — Other Ambulatory Visit: Payer: Self-pay | Admitting: Nurse Practitioner

## 2023-02-23 NOTE — Progress Notes (Unsigned)
  Cardiology Office Note:   Date:  02/25/2023  ID:  Scott Townsend, DOB [birth year], MRN [id number]  History of Present Illness:   Scott Townsend is a 67 y.o. male who presents for follow up of atrial fibrillation.  He was diagnosed with atrial fibrillation in 2013 and has been cardioverted multiple times.  He was started on flecainide.  He  went back to atrial fibrillation, he was started on Xarelto and had ramipril increased to 10 mg twice a day.  Unfortunately due to cost reasons, he was unable to take the Xarelto and was switched to Coumadin.  He also has significant dizziness and fatigue on the chlorthalidone. He has had multiple afib episodes in January and April 2023.    He stopped taking Coumadin because he could not get back for the INR's.  He is rarely having palpitations though.  He felt like he always knew when he was in atrial fibrillation.  He is probably had about 5 episodes lasting 30 seconds of palpitations since I saw him.  He otherwise feels well.  The patient denies any new symptoms such as chest discomfort, neck or arm discomfort. There has been no new shortness of breath, PND or orthopnea. There have been no reported palpitations, presyncope or syncope.   ROS: As stated in the HPI and negative for all other systems.   Studies Reviewed:    EKG: Sinus rhythm, rate 74, axis within normal limits, intervals within normal limits, no acute ST-T wave changes.    Risk Assessment/Calculations:    CHA2DS2-VASc Score = 2   This indicates a 2.2% annual risk of stroke. The patient's score is based upon: CHF History: 0 HTN History: 1 Diabetes History: 0 Stroke History: 0 Vascular Disease History: 0 Age Score: 1 Gender Score: 0            Physical Exam:   VS:  BP 134/72 (BP Location: Left Arm, Patient Position: Sitting, Cuff Size: Normal)   Pulse 74   Ht 5\' 10"  (1.778 m)   Wt 230 lb 6.4 oz (104.5 kg)   SpO2 97%   BMI 33.06 kg/m    Wt Readings from Last 3  Encounters:  02/25/23 230 lb 6.4 oz (104.5 kg)  02/05/23 227 lb 6.4 oz (103.1 kg)  12/29/22 227 lb 9.6 oz (103.2 kg)     GEN: Well nourished, well developed in no acute distress NECK: No JVD; No carotid bruits CARDIAC: RRR, no murmurs, rubs, gallops RESPIRATORY:  Clear to auscultation without rales, wheezing or rhonchi  ABDOMEN: Soft, non-tender, non-distended EXTREMITIES:  No edema; No deformity   ASSESSMENT AND PLAN:   PAF:   Since he is not having atrial fibrillation and he has a relatively low CHA2DS2-VASc score it is not unreasonable but he elected to come off of anticoagulation.  He would however let me know in the future if he has an increased burden.  Otherwise no change in therapy.   HTN: The blood pressure is at target.  No change in therapy.  Blood pressure is not at target.  And bending increases in general is a to 10 mg 3 times daily instead of twice.         Signed, Rollene Rotunda, MD

## 2023-02-23 NOTE — Telephone Encounter (Signed)
Requesting: Gabapentin 300mg  Last Visit: 02/05/2023 Next Visit: 05/07/2023 Last Refill: 02/05/23  QTY 270 with 1 refill  Please Advise

## 2023-02-25 ENCOUNTER — Ambulatory Visit: Payer: Medicare Other | Attending: Cardiology | Admitting: Cardiology

## 2023-02-25 ENCOUNTER — Encounter: Payer: Self-pay | Admitting: Cardiology

## 2023-02-25 VITALS — BP 134/72 | HR 74 | Ht 70.0 in | Wt 230.4 lb

## 2023-02-25 DIAGNOSIS — I480 Paroxysmal atrial fibrillation: Secondary | ICD-10-CM | POA: Diagnosis present

## 2023-02-25 DIAGNOSIS — I10 Essential (primary) hypertension: Secondary | ICD-10-CM | POA: Insufficient documentation

## 2023-02-25 MED ORDER — FLECAINIDE ACETATE 100 MG PO TABS: 100.0000 mg | ORAL_TABLET | Freq: Two times a day (BID) | ORAL | 3 refills | Status: DC

## 2023-02-25 MED ORDER — HYDRALAZINE HCL 25 MG PO TABS: 25.0000 mg | ORAL_TABLET | Freq: Three times a day (TID) | ORAL | 3 refills | Status: DC

## 2023-02-25 MED ORDER — DILTIAZEM HCL ER COATED BEADS 300 MG PO CP24: 300.0000 mg | ORAL_CAPSULE | Freq: Every day | ORAL | 3 refills | Status: DC

## 2023-02-25 MED ORDER — RAMIPRIL 10 MG PO CAPS: 10.0000 mg | ORAL_CAPSULE | Freq: Two times a day (BID) | ORAL | 3 refills | Status: DC

## 2023-02-25 NOTE — Patient Instructions (Addendum)
Medication Instructions:  Your physician recommends that you continue on your current medications as directed. Please refer to the Current Medication list given to you today.  *If you need a refill on your cardiac medications before your next appointment, please call your pharmacy*  Follow-Up: At [facility], you and your health needs are our priority.  As part of our continuing mission to provide you with exceptional heart care, we have created designated Provider Care Teams.  These Care Teams include your primary Cardiologist (physician) and Advanced Practice Providers (APPs -  Physician Assistants and Nurse Practitioners) who all work together to provide you with the care you need, when you need it.  We recommend signing up for the patient portal called "MyChart".  Sign up information is provided on this After Visit Summary.  MyChart is used to connect with patients for Virtual Visits (Telemedicine).  Patients are able to view lab/test results, encounter notes, upcoming appointments, etc.  Non-urgent messages can be sent to your provider as well.   To learn more about what you can do with MyChart, go to [email or web address].    Your next appointment:   12 month(s)  Provider:   James Hochrein, MD     

## 2023-03-02 ENCOUNTER — Ambulatory Visit: Payer: Medicare Other | Admitting: Nurse Practitioner

## 2023-03-05 ENCOUNTER — Encounter: Payer: Self-pay | Admitting: Nurse Practitioner

## 2023-03-09 ENCOUNTER — Other Ambulatory Visit: Payer: Self-pay

## 2023-03-09 DIAGNOSIS — R202 Paresthesia of skin: Secondary | ICD-10-CM

## 2023-03-18 ENCOUNTER — Ambulatory Visit (INDEPENDENT_AMBULATORY_CARE_PROVIDER_SITE_OTHER): Payer: Medicare Other | Admitting: Neurology

## 2023-03-18 DIAGNOSIS — R202 Paresthesia of skin: Secondary | ICD-10-CM

## 2023-03-18 DIAGNOSIS — M5417 Radiculopathy, lumbosacral region: Secondary | ICD-10-CM

## 2023-03-18 NOTE — Procedures (Signed)
  [facility]  [address] [place], [address]  [place], [place] [address] Tel: [phone] Fax: [phone] Test Date:  03/18/2023  Patient: Scott Townsend, Scott Townsend DOB: 1955/12/02 Physician: Nita Sickle, DO  Sex: Male Height: 5\' 10"  Ref Phys: Luis Abed, DO  ID#: [id number]   Technician:    History: This is a 67 year old man referred for evaluation of left foot weakness.  NCV & EMG Findings: Electrodiagnostic testing of the left lower extremity shows:  Left sural and superficial peroneal sensory responses are within normal limits. Left peroneal motor response at the extensor digitorum brevis is reduced (L 2.2 mV), and normal at the tibialis anterior.  Left tibial motor response is within normal limits. Left tibial H reflex study is within normal limits. Chronic motor axon loss changes are seen affecting the anterior tibialis, extensor hallucis longus, fibularis longus, and rectus femoris muscles, without accompanying active denervation.  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  A left common peroneal mononeuropathy seems less likely with normal superficial peroneal sensory response.   ___________________________ Nita Sickle, DO    Nerve Conduction Studies   Stim Site NR Peak (ms) Norm Peak (ms) O-P Amp (V) Norm O-P Amp  Left Sup Peroneal Anti Sensory (Ant Lat Mall)  32 C  12 cm    3.7 <4.6 11.7 >3  Left Sural Anti Sensory (Lat Mall)  32 C  Calf    3.0 <4.6 9.4 >3     Stim Site NR Onset (ms) Norm Onset (ms) O-P Amp (mV) Norm O-P Amp Site1 Site2 Delta-0 (ms) Dist (cm) Vel (m/s) Norm Vel (m/s)  Left Peroneal Motor (Ext Dig Brev)  32 C  Ankle    3.1 <6.0 *2.2 >2.5 B Fib Ankle 9.2 39.0 42 >40  B Fib    12.3  2.2  Poplt B Fib 1.8 8.0 44 >40  Poplt    14.1  2.0         Left Peroneal TA Motor (Tib Ant)  32 C  Fib Head    2.9 <4.5 4.6 >3 Poplit Fib Head 1.4 8.0 57 >40   Poplit    4.3 <5.7 4.5         Left Tibial Motor (Abd Hall Brev)  32 C  Ankle    4.1 <6.0 6.1 >4 Knee Ankle 10.3 45.0 44 >40  Knee    14.4  4.2          Electromyography   Side Muscle Ins.Act Fibs Fasc Recrt Amp Dur Poly Activation Comment  Left AntTibialis Nml Nml Nml *1- *1+ *1+ *1+ Nml N/A  Left Gastroc Nml Nml Nml Nml Nml Nml Nml Nml N/A  Left Flex Dig Long Nml Nml Nml Nml Nml Nml Nml Nml N/A  Left RectFemoris Nml Nml Nml *1- *1+ *1+ *1+ Nml N/A  Left GluteusMed Nml Nml Nml Nml Nml Nml Nml Nml N/A  Left BicepsFem Short Nml Nml Nml Nml Nml Nml Nml Nml N/A  Left ExtHallLong Nml Nml Nml *1- *1+ *1+ *1+ Nml N/A  Left Fibularis Long Nml Nml Nml *1- *1+ *1+ *1+ Nml N/A  Left AdductorLong Nml Nml Nml Nml Nml Nml Nml Nml N/A      Waveforms:

## 2023-05-07 ENCOUNTER — Ambulatory Visit (INDEPENDENT_AMBULATORY_CARE_PROVIDER_SITE_OTHER): Payer: Medicare Other | Admitting: Nurse Practitioner

## 2023-05-07 ENCOUNTER — Ambulatory Visit (HOSPITAL_COMMUNITY)
Admission: RE | Admit: 2023-05-07 | Discharge: 2023-05-07 | Disposition: A | Discharge status: Home / Self Care | Payer: Medicare Other | Source: Ambulatory Visit | Attending: Nurse Practitioner | Admitting: Nurse Practitioner

## 2023-05-07 ENCOUNTER — Encounter: Payer: Self-pay | Admitting: Nurse Practitioner

## 2023-05-07 ENCOUNTER — Ambulatory Visit (HOSPITAL_COMMUNITY): Payer: Medicare Other

## 2023-05-07 VITALS — BP 124/86 | HR 65 | Temp 97.6°F | Ht 70.0 in | Wt 232.4 lb

## 2023-05-07 DIAGNOSIS — G8929 Other chronic pain: Secondary | ICD-10-CM | POA: Diagnosis not present

## 2023-05-07 DIAGNOSIS — R600 Localized edema: Secondary | ICD-10-CM | POA: Diagnosis present

## 2023-05-07 DIAGNOSIS — M5442 Lumbago with sciatica, left side: Secondary | ICD-10-CM | POA: Diagnosis not present

## 2023-05-07 LAB — EXTRA LAV TOP TUBE

## 2023-05-07 MED ORDER — GABAPENTIN 300 MG PO CAPS: 600.0000 mg | ORAL_CAPSULE | Freq: Three times a day (TID) | ORAL | 1 refills | Status: DC

## 2023-05-07 NOTE — Progress Notes (Signed)
Established Patient Office Visit  Subjective   Patient ID: Scott Townsend, male    DOB: 08-06-1956  Age: 67 y.o. MRN: [id number]  Chief Complaint  Patient presents with   Back Pain    Follow up, concerns with left knee swelling    HPI  Scott Townsend is here to follow up on back pain and has also noticed his left leg swelling for the las week.   He states that his back pain is doing a lot better.  He went to orthopedics and had an injection done in his groin which really helped with the pain.  He is still taking the gabapentin twice a day.  However, he does note that he had some swelling in his left leg for the last week.  He endorses a recent trip to the beach, however they did stop at least twice on the 4-hour drive.  He denies chest pain, however has been having some slightly more shortness of breath than normal.  He has not noticed any changes in his diet and limits the amount of salt that he eats.  He is also having some pain in the back of his left calf.  He has not tried anything for this at home.    ROS See pertinent positives and negatives per HPI.    Objective:     BP 124/86 (BP Location: Left Arm)   Pulse 65   Temp 97.6 F (36.4 C) (Oral)   Ht 5\' 10"  (1.778 m)   Wt 232 lb 6.4 oz (105.4 kg)   SpO2 96%   BMI 33.35 kg/m    Physical Exam Vitals and nursing note reviewed.  Constitutional:      Appearance: Normal appearance.  HENT:     Head: Normocephalic.  Eyes:     Conjunctiva/sclera: Conjunctivae normal.  Cardiovascular:     Rate and Rhythm: Normal rate and regular rhythm.     Pulses: Normal pulses.     Heart sounds: Normal heart sounds.  Pulmonary:     Effort: Pulmonary effort is normal.     Breath sounds: Normal breath sounds.  Musculoskeletal:     Cervical back: Normal range of motion.     Right lower leg: Edema (trace) present.     Left lower leg: Edema (3+ pitting) present.  Skin:    General: Skin is warm.  Neurological:      General: No focal deficit present.     Mental Status: He is alert and oriented to person, place, and time.  Psychiatric:        Mood and Affect: Mood normal.        Behavior: Behavior normal.        Thought Content: Thought content normal.        Judgment: Judgment normal.    The 10-year ASCVD risk score (Arnett DK, et al., 2019) is: 11.4%    Assessment & Plan:   Problem List Items Addressed This Visit       Other   Chronic low back pain    Chronic, stable.  He states that his symptoms have improved significantly since getting an injection in his left groin.  He is currently following with orthopedics as well and continue to take gabapentin 300 mg 5 times a day.  He needs a refill of this today, refill sent to the pharmacy.      Relevant Medications   gabapentin (NEURONTIN) 300 MG capsule   Leg edema, left - Primary  He has 3+ edema in his left leg and trace edema in his right leg.  This started about a week ago.  He does have a history of paroxysmal A-fib and a recent car trip.  Concern for DVT, will check a stat ultrasound of his leg.  Will also check BMP, BNP, and D-dimer today.  He can wear compression socks to help with the swelling and limit salt in his diet.  Follow-up based on lab results and imaging.      Relevant Orders   VAS [place] LOWER EXTREMITY VENOUS (DVT) (Completed)   Basic metabolic panel   D-Dimer, Quantitative   B Nat Peptide   US Venous Img Lower Unilateral Left (DVT)    Return in about 3 months (around 08/07/2023) for CPE.    [name], NP

## 2023-05-07 NOTE — Assessment & Plan Note (Signed)
He has 3+ edema in his left leg and trace edema in his right leg.  This started about a week ago.  He does have a history of paroxysmal A-fib and a recent car trip.  Concern for DVT, will check a stat ultrasound of his leg.  Will also check BMP, BNP, and D-dimer today.  He can wear compression socks to help with the swelling and limit salt in his diet.  Follow-up based on lab results and imaging.

## 2023-05-07 NOTE — Addendum Note (Signed)
Addended by: Rodman Pickle A on: 05/07/2023 04:41 PM   Modules accepted: Level of Service

## 2023-05-07 NOTE — Patient Instructions (Signed)
It was great to see you!  I have ordered an ultrasound of your leg, they will call to schedule   We are checking your labs today and will let you know the results via mychart/phone.   Let's follow-up in 3 months, sooner if you have concerns.  If a referral was placed today, you will be contacted for an appointment. Please note that routine referrals can sometimes take up to 3-4 weeks to process. Please call our office if you haven't heard anything after this time frame.  Take care,  Rodman Pickle, NP

## 2023-05-07 NOTE — Assessment & Plan Note (Signed)
Chronic, stable.  He states that his symptoms have improved significantly since getting an injection in his left groin.  He is currently following with orthopedics as well and continue to take gabapentin 300 mg 5 times a day.  He needs a refill of this today, refill sent to the pharmacy.

## 2023-05-08 LAB — D-DIMER, QUANTITATIVE: D-Dimer, Quant: 0.9 mcg/mL FEU — ABNORMAL HIGH (ref <0.50)

## 2023-05-08 LAB — BASIC METABOLIC PANEL
Calcium: 8.9 mg/dL (ref 8.6–10.3)
Chloride: 107 mmol/L (ref 98–110)
Potassium: 3.9 mmol/L (ref 3.5–5.3)
Sodium: 139 mmol/L (ref 135–146)

## 2023-05-11 LAB — BRAIN NATRIURETIC PEPTIDE

## 2023-05-11 LAB — BASIC METABOLIC PANEL
BUN: 11 mg/dL (ref 7–25)
CO2: 21 mmol/L (ref 20–32)
Creat: 0.97 mg/dL (ref 0.70–1.35)
Glucose, Bld: 119 mg/dL — ABNORMAL HIGH (ref 65–99)

## 2023-05-12 ENCOUNTER — Other Ambulatory Visit: Payer: Self-pay

## 2023-05-12 ENCOUNTER — Encounter: Payer: Self-pay | Admitting: Nurse Practitioner

## 2023-05-12 DIAGNOSIS — R7301 Impaired fasting glucose: Secondary | ICD-10-CM

## 2023-05-12 DIAGNOSIS — R600 Localized edema: Secondary | ICD-10-CM

## 2023-05-13 ENCOUNTER — Other Ambulatory Visit (INDEPENDENT_AMBULATORY_CARE_PROVIDER_SITE_OTHER): Payer: Medicare Other

## 2023-05-13 DIAGNOSIS — R600 Localized edema: Secondary | ICD-10-CM

## 2023-05-13 DIAGNOSIS — R7301 Impaired fasting glucose: Secondary | ICD-10-CM

## 2023-05-13 LAB — HEMOGLOBIN A1C: Hgb A1c MFr Bld: 5.7 % (ref 4.6–6.5)

## 2023-05-13 LAB — BRAIN NATRIURETIC PEPTIDE: Pro B Natriuretic peptide (BNP): 20 pg/mL (ref 0.0–100.0)

## 2023-05-20 ENCOUNTER — Encounter: Payer: Self-pay | Admitting: Internal Medicine

## 2023-05-20 ENCOUNTER — Encounter: Payer: Self-pay | Admitting: Nurse Practitioner

## 2023-05-20 ENCOUNTER — Telehealth: Payer: Self-pay | Admitting: Nurse Practitioner

## 2023-05-20 ENCOUNTER — Ambulatory Visit (INDEPENDENT_AMBULATORY_CARE_PROVIDER_SITE_OTHER): Payer: Medicare Other | Admitting: Internal Medicine

## 2023-05-20 VITALS — BP 134/76 | HR 81 | Temp 99.3°F | Ht 70.0 in | Wt 233.0 lb

## 2023-05-20 DIAGNOSIS — U071 COVID-19: Secondary | ICD-10-CM | POA: Diagnosis not present

## 2023-05-20 MED ORDER — AZITHROMYCIN 250 MG PO TABS
ORAL_TABLET | ORAL | 0 refills | Status: AC
Start: 2023-05-20 — End: 2023-05-25

## 2023-05-20 NOTE — Telephone Encounter (Signed)
Error

## 2023-05-20 NOTE — Progress Notes (Signed)
[facility] [address] [place] [address] Dept: [phone] Dept Fax: [phone]  Acute Care Office Visit  Subjective:   Scott Townsend 03/02/1956 05/20/2023  Chief Complaint  Patient presents with   Covid Positive    Sore throat, cough, fever heachaches, body aches, nausea    HPI: Discussed the use of AI scribe software for clinical note transcription with the patient, who gave verbal consent to proceed.  History of Present Illness   The patient, with a history of atrial fibrillation and hypertension, presents with a positive COVID-19 test today, and symptoms including a severe headache, cough, sore throat, congestion, and minor nausea that started 1 day ago.  The headache is described as 'unbelievable' and unresponsive to Tylenol and ibuprofen.They deny palpitations, CP, wheezing, and diarrhea. The patient has been managing symptoms with Tylenol, ibuprofen, Sprite, and water.    The following portions of the patient's history were reviewed and updated as appropriate: past medical history, past surgical history, family history, social history, allergies, medications, and problem list.   Patient Active Problem List   Diagnosis Date Noted   Leg edema, left 05/07/2023   Chronic low back pain 12/04/2022   History of tobacco use 12/04/2022   Atrial fibrillation (HCC) 03/08/2020   Paroxysmal atrial fibrillation (HCC) 01/25/2020   Essential hypertension 01/25/2020   Educated about COVID-19 virus infection 01/25/2020   Atrial fibrillation with RVR (HCC) 02/16/2012   Past Medical History:  Diagnosis Date   Arthritis    Atrial fibrillation (HCC)    Barrett esophagus    GERD (gastroesophageal reflux disease)    Hypertension    Past Surgical History:  Procedure Laterality Date   CARDIOVERSION     Family History  Problem Relation Age of Onset   Deep vein thrombosis Mother    Varicose Veins Mother    Alcohol  abuse Father    Heart attack Father 2   Stroke Father 50   Outpatient Medications Prior to Visit  Medication Sig Dispense Refill   diltiazem (CARDIZEM CD) 300 MG 24 hr capsule Take 1 capsule (300 mg total) by mouth daily. 90 capsule 3   diphenhydramine-acetaminophen (TYLENOL PM) 25-500 MG TABS Take 1 tablet by mouth daily.     flecainide (TAMBOCOR) 100 MG tablet Take 1 tablet (100 mg total) by mouth 2 (two) times daily. 180 tablet 3   gabapentin (NEURONTIN) 300 MG capsule Take 2 capsules (600 mg total) by mouth 3 (three) times daily. 540 capsule 1   hydrALAZINE (APRESOLINE) 25 MG tablet Take 1 tablet (25 mg total) by mouth 3 (three) times daily. 180 tablet 3   lansoprazole (PREVACID) 30 MG capsule Take 30 mg by mouth daily.     ramipril (ALTACE) 10 MG capsule Take 1 capsule (10 mg total) by mouth 2 (two) times daily. 180 capsule 3   No facility-administered medications prior to visit.   Allergies  Allergen Reactions   Percocet [Oxycodone-Acetaminophen] Nausea And Vomiting   Vicodin [Hydrocodone-Acetaminophen] Nausea Only     ROS: A complete ROS was performed with pertinent positives/negatives noted in the HPI. The remainder of the ROS are negative.    Objective:   Today's Vitals   05/20/23 1449  BP: 134/76  Pulse: 81  Temp: 99.3 F (37.4 C)  TempSrc: Temporal  SpO2: 98%  Weight: 233 lb (105.7 kg)  Height: 5\' 10"  (1.778 m)    GENERAL: Well-appearing, in NAD. Well nourished.  SKIN: Pink, warm and dry. No  rash, lesion, ulceration, or ecchymoses.  HEENT:    HEAD: Normocephalic, non-traumatic.  EYES: Conjunctive pink without exudate. PERRL, EOMI.  EARS: External ear w/o redness, swelling, masses, or lesions. EAC clear. TM's intact, translucent w/o bulging, appropriate landmarks visualized.  NOSE: Septum midline w/o deformity. Nares patent, mucosa pink and inflamed w/o drainage. No sinus tenderness.  THROAT: Uvula midline. Oropharynx clear.. Mucus membranes pink and moist.   NECK: Trachea midline. Full ROM w/o pain or tenderness. No lymphadenopathy.  RESPIRATORY: Chest wall symmetrical. Respirations even and non-labored. Breath sounds clear to auscultation bilaterally.  CARDIAC: S1, S2 present, regular rate and rhythm. Peripheral pulses 2+ bilaterally.  EXTREMITIES: Without clubbing, cyanosis, or edema.  NEUROLOGIC: Steady, even gait.  PSYCH/MENTAL STATUS: Alert, oriented x 3. Cooperative, appropriate mood and affect.    No results found for any visits on 05/20/23.    Assessment & Plan:  Assessment and Plan    COVID-19 Not a candidate for Paxlovid due to significant medication interaction -Continue supportive care with hydration and rest. -Use over-the-counter Coricidin HBP for cough and congestion. -Start Z-Pak (Azithromycin) to prevent potential secondary bacterial infection/pneumonia. -Monitor symptoms closely, particularly oxygen saturation. Seek immediate medical attention if oxygen saturation drops below 90%.    Vitamin Regimen:  Vitamin C 500mg  twice daily  Vitamin D 5000 units once daily  Zinc 50-75mg  once daily   Over the counter Medications:  Aspirin 81mg  per day * unless allergic or contraindicated*  Use Tylenol (acetaminophen) for fever *unless allergic or contraindicated*    Non-Medication Therapy:  Drink plenty of fluids, warm if possible.  A teaspoon of honey may help ease coughing symptoms.  Cough drops or hard candy for coughing.   Over the Counter Medication Therapy:  Use a cough expectorant such as guaifenesin (Mucinex) if recommended by your doctor for a wet, congested cough. If you have high blood pressure, please ask your doctor first before using this.  Use a cough suppressant such as dextromethorphan (Robitussin/Delsym) for a dry cough. If you have high blood pressure, please ask your doctor first before using this.  If you have high blood pressure, medication such as Coricidin HBP is safe to take for your cough and will  not increase your blood pressure.    Stay home and away from others until symptoms are improving and fever free for 24 hours without the use of tylenol or ibuprofen. If symptoms have improved and fever resolved, then can be in public setting, but should wear a mask around others, practice safe distancing, and perform hand hygiene for an additional 5 days.  If symptoms worsen, or the patient develop shortness of breath, pulse oximeter reading of < 90%, or chest pain, seek immediate care at nearest emergency room or call 911.    Meds ordered this encounter  Medications   azithromycin (ZITHROMAX) 250 MG tablet    Sig: Take 2 tablets on day 1, then 1 tablet daily on days 2 through 5    Dispense:  6 tablet    Refill:  0    Order Specific Question:   Supervising Provider    Answer:   Garnette Gunner [[id number]]   Lab Orders  No laboratory test(s) ordered today   No images are attached to the encounter or orders placed in the encounter.  Return if symptoms worsen or fail to improve.   Of note, portions of this note may have been created with voice recognition software Physicist, medical). While this note has been edited for accuracy, occasional  wrong-word or 'sound-a-like' substitutions may have occurred due to the inherent limitations of voice recognition software.  Salvatore Decent, FNP

## 2023-05-20 NOTE — Patient Instructions (Signed)
COVID-19 positive:  Patient not a candidate for paxlovid prescription due to Flecainide medication interaction. .    Vitamin Regimen:  Vitamin C 500mg  twice daily  Vitamin D 5000 units once daily  Zinc 50-75mg  once daily   Over the counter Medications:  Aspirin 81mg  per day * unless allergic or contraindicated*  Use Tylenol (acetaminophen) for fever *unless allergic or contraindicated*    Non-Medication Therapy:  Drink plenty of fluids, warm if possible.  A teaspoon of honey may help ease coughing symptoms.  Cough drops or hard candy for coughing.  Nedipot/Neilmed sinus rinse for nasal congestion   Over the Counter Medication Therapy: If you have high blood pressure, medication such as Coricidin HBP is safe to take for your cough and will not increase your blood pressure.  Flonase nasal spray as needed for nasal congestion    Stay home and away from others until symptoms are improving and fever free for 24 hours without the use of tylenol or ibuprofen. If symptoms have improved and fever resolved, then can be in public setting, but should wear a mask around others, practice safe distancing, and perform hand hygiene for an additional 5 days.  If symptoms worsen, or the patient develop shortness of breath, pulse oximeter reading of < 90%, or chest pain, seek immediate care at nearest emergency room or call 911.

## 2023-06-08 ENCOUNTER — Encounter: Payer: Self-pay | Admitting: Nurse Practitioner

## 2023-06-10 MED ORDER — PREGABALIN 75 MG PO CAPS: 75.0000 mg | ORAL_CAPSULE | Freq: Two times a day (BID) | ORAL | 1 refills | Status: DC

## 2023-07-16 ENCOUNTER — Encounter (HOSPITAL_COMMUNITY): Payer: Self-pay

## 2023-07-16 ENCOUNTER — Emergency Department (HOSPITAL_COMMUNITY): Payer: Medicare Other

## 2023-07-16 ENCOUNTER — Emergency Department (HOSPITAL_COMMUNITY)
Admission: EM | Admit: 2023-07-16 | Discharge: 2023-07-16 | Disposition: A | Discharge status: Home / Self Care | Payer: Medicare Other | Attending: Emergency Medicine | Admitting: Emergency Medicine

## 2023-07-16 ENCOUNTER — Other Ambulatory Visit: Payer: Self-pay

## 2023-07-16 ENCOUNTER — Encounter: Payer: Self-pay | Admitting: Nurse Practitioner

## 2023-07-16 DIAGNOSIS — Z79899 Other long term (current) drug therapy: Secondary | ICD-10-CM | POA: Diagnosis not present

## 2023-07-16 DIAGNOSIS — I10 Essential (primary) hypertension: Secondary | ICD-10-CM | POA: Insufficient documentation

## 2023-07-16 DIAGNOSIS — K529 Noninfective gastroenteritis and colitis, unspecified: Secondary | ICD-10-CM | POA: Diagnosis not present

## 2023-07-16 DIAGNOSIS — R1032 Left lower quadrant pain: Secondary | ICD-10-CM | POA: Diagnosis present

## 2023-07-16 LAB — COMPREHENSIVE METABOLIC PANEL
ALT: 21 U/L (ref 0–44)
AST: 18 U/L (ref 15–41)
Albumin: 3.7 g/dL (ref 3.5–5.0)
Alkaline Phosphatase: 50 U/L (ref 38–126)
Anion gap: 8 (ref 5–15)
BUN: 15 mg/dL (ref 8–23)
CO2: 23 mmol/L (ref 22–32)
Calcium: 8.5 mg/dL — ABNORMAL LOW (ref 8.9–10.3)
Chloride: 102 mmol/L (ref 98–111)
Creatinine, Ser: 0.88 mg/dL (ref 0.61–1.24)
GFR, Estimated: >60 mL/min (ref >60)
Glucose, Bld: 108 mg/dL — ABNORMAL HIGH (ref 70–99)
Potassium: 3.4 mmol/L — ABNORMAL LOW (ref 3.5–5.1)
Sodium: 133 mmol/L — ABNORMAL LOW (ref 135–145)
Total Bilirubin: 0.9 mg/dL (ref 0.3–1.2)
Total Protein: 6.6 g/dL (ref 6.5–8.1)

## 2023-07-16 LAB — CBC WITH DIFFERENTIAL/PLATELET
Abs Immature Granulocytes: 0.08 10*3/uL — ABNORMAL HIGH (ref 0.00–0.07)
Basophils Absolute: 0 10*3/uL (ref 0.0–0.1)
Basophils Relative: 0 %
Eosinophils Absolute: 0 10*3/uL (ref 0.0–0.5)
Eosinophils Relative: 0 %
HCT: 43.7 % (ref 39.0–52.0)
Hemoglobin: 14.8 g/dL (ref 13.0–17.0)
Immature Granulocytes: 1 %
Lymphocytes Relative: 11 %
Lymphs Abs: 1.7 10*3/uL (ref 0.7–4.0)
MCH: 31.6 pg (ref 26.0–34.0)
MCHC: 33.9 g/dL (ref 30.0–36.0)
MCV: 93.2 fL (ref 80.0–100.0)
Monocytes Absolute: 1.7 10*3/uL — ABNORMAL HIGH (ref 0.1–1.0)
Monocytes Relative: 11 %
Neutro Abs: 12.1 10*3/uL — ABNORMAL HIGH (ref 1.7–7.7)
Neutrophils Relative %: 77 %
Platelets: 180 10*3/uL (ref 150–400)
RBC: 4.69 MIL/uL (ref 4.22–5.81)
RDW: 13 % (ref 11.5–15.5)
WBC: 15.6 10*3/uL — ABNORMAL HIGH (ref 4.0–10.5)
nRBC: 0 % (ref 0.0–0.2)

## 2023-07-16 LAB — I-STAT CG4 LACTIC ACID, ED: Lactic Acid, Venous: 0.7 mmol/L (ref 0.5–1.9)

## 2023-07-16 LAB — LIPASE, BLOOD: Lipase: 27 U/L (ref 11–51)

## 2023-07-16 MED ORDER — AMOXICILLIN-POT CLAVULANATE 875-125 MG PO TABS
1.0000 | ORAL_TABLET | Freq: Two times a day (BID) | ORAL | 0 refills | Status: DC
Start: 2023-07-16 — End: 2023-08-09

## 2023-07-16 MED ORDER — ONDANSETRON HCL 4 MG/2ML IJ SOLN
4.0000 mg | Freq: Once | INTRAMUSCULAR | Status: AC
  Administered 2023-07-16: 4 mg via INTRAVENOUS
  Filled 2023-07-16: qty 2

## 2023-07-16 MED ORDER — IOHEXOL 300 MG/ML  SOLN
100.0000 mL | Freq: Once | INTRAMUSCULAR | Status: AC | PRN
  Administered 2023-07-16: 100 mL via INTRAVENOUS

## 2023-07-16 MED ORDER — ONDANSETRON 4 MG PO TBDP: 4.0000 mg | ORAL_TABLET | Freq: Three times a day (TID) | ORAL | 0 refills | Status: DC | PRN

## 2023-07-16 MED ORDER — SODIUM CHLORIDE 0.9 % IV BOLUS
1000.0000 mL | Freq: Once | INTRAVENOUS | Status: AC
  Administered 2023-07-16: 1000 mL via INTRAVENOUS

## 2023-07-16 NOTE — ED Provider Notes (Signed)
[place] EMERGENCY DEPARTMENT AT [facility] Provider Note   CSN: [id number] Arrival date & time: 07/16/23  1149     History  Chief Complaint  Patient presents with   Abdominal Pain    Scott Townsend is a 67 y.o. male with past medical history of paroxysmal A-fib, GERD, hypertension presenting to the emergency with 2 days of left lower quadrant pain.  Pain is associated with loose stools and vomiting.  Pain radiates to his back.  Patient noticed blood in BM.  Reports he has not had any diarrhea today.  Has not tried anything for treatment.  Patient contacted primary care doctor who recommended he come to the emergency room.  Patient also reports chills at home, denies known fevers. Has not tolerating eating and drinking within the last 24hrs. Has never had anything like this in the past. No known diverticulosis or diverticulitis. No CP, SOB, difficulty with urination.    Abdominal Pain      Home Medications Prior to Admission medications   Medication Sig Start Date End Date Taking? Authorizing Provider  diltiazem (CARDIZEM CD) 300 MG 24 hr capsule Take 1 capsule (300 mg total) by mouth daily. 02/25/23   Rollene Rotunda, MD  diphenhydramine-acetaminophen (TYLENOL PM) 25-500 MG TABS Take 1 tablet by mouth daily.    [provider]  flecainide (TAMBOCOR) 100 MG tablet Take 1 tablet (100 mg total) by mouth 2 (two) times daily. 02/25/23   Rollene Rotunda, MD  hydrALAZINE (APRESOLINE) 25 MG tablet Take 1 tablet (25 mg total) by mouth 3 (three) times daily. 02/25/23   Rollene Rotunda, MD  lansoprazole (PREVACID) 30 MG capsule Take 30 mg by mouth daily.    [provider]  pregabalin (LYRICA) 75 MG capsule Take 1 capsule (75 mg total) by mouth 2 (two) times daily. 06/10/23   McElwee, Lauren A, NP  ramipril (ALTACE) 10 MG capsule Take 1 capsule (10 mg total) by mouth 2 (two) times daily. 02/25/23   Rollene Rotunda, MD      Allergies    Percocet  [oxycodone-acetaminophen] and Vicodin [hydrocodone-acetaminophen]    Review of Systems   Review of Systems  Gastrointestinal:  Positive for abdominal pain.    Physical Exam Updated Vital Signs BP (!) 141/79 (BP Location: Left Arm)   Pulse (!) 117   Temp 98.2 F (36.8 C) (Oral)   Resp 18   Ht 5\' 10"  (1.778 m)   Wt 102.1 kg   SpO2 97%   BMI 32.28 kg/m  Physical Exam Vitals and nursing note reviewed.  Constitutional:      General: He is not in acute distress.    Appearance: He is not ill-appearing, toxic-appearing or diaphoretic.  HENT:     Head: Normocephalic and atraumatic.     Nose: No congestion or rhinorrhea.  Eyes:     General: No scleral icterus.    Conjunctiva/sclera: Conjunctivae normal.  Cardiovascular:     Rate and Rhythm: Regular rhythm. Tachycardia present.     Pulses: Normal pulses.     Heart sounds: Normal heart sounds. No murmur heard. Pulmonary:     Effort: Pulmonary effort is normal. No respiratory distress.     Breath sounds: Normal breath sounds. No stridor. No wheezing.  Abdominal:     General: Abdomen is flat. Bowel sounds are normal. There is no distension.     Palpations: Abdomen is soft. There is no mass.     Tenderness: There is abdominal tenderness. There is no  guarding.     Hernia: No hernia is present.     Comments: LLQ TTP  (-) McBurneys   Musculoskeletal:     Right lower leg: No edema.     Left lower leg: No edema.  Skin:    General: Skin is warm and dry.     Capillary Refill: Capillary refill takes less than 2 seconds.     Coloration: Skin is not jaundiced or pale.     Findings: No lesion or rash.  Neurological:     General: No focal deficit present.     Mental Status: He is alert and oriented to person, place, and time. Mental status is at baseline.     Sensory: No sensory deficit.     Motor: No weakness.     Gait: Gait normal.     ED Results / Procedures / Treatments   Labs (all labs ordered are listed, but only abnormal  results are displayed) Labs Reviewed - No data to display  EKG None  Radiology No results found.  Procedures Procedures    Medications Ordered in ED Medications - No data to display  ED Course/ Medical Decision Making/ A&P                                 Medical Decision Making Amount and/or Complexity of Data Reviewed Labs: ordered. Radiology: ordered.  Risk Prescription drug management.   Scott Townsend 67 y.o. presented today for abd pain. Working DDx includes, but not limited to, gastroenteritis, colitis, SBO, appendicitis, cholecystitis, hepatobiliary pathology, gastritis, PUD, ACS, dissection, pancreatitis, nephrolithiasis, AAA, UTI, pyelonephritis, ruptured ectopic pregnancy, PID, ovarian  R/o DDx: These are considered less likely than current impression due to history of present illness, physical exam, labs/imaging findings.  Review of prior external notes: Reviewed notes today between patient and primary care  Pmhx:   Unique Tests and My Interpretation:  CBC with differential: 15.6, no anemia CMP: Potassium 3.4, Na 133 Lipase: 27 Lactic: 0.7  EKG: Rate, rhythm, axis, intervals all examined: sinus  Imaging:  CT Abd/Pelvis with contrast: evaluate for structural/surgical etiology of patients' severe abdominal pain.  Moderate inflammatory changes of transverse and proximal descending colon consistent with colitis, hiatal hernia   Problem List / ED Course / Critical interventions / Medication management  Patient presenting to the emergency room with left lower quadrant pain since yesterday.  Patient has been tolerating oral intake.  Seen at primary care who recommended he come here.  Patient does have elevated white count was found to have colitis on CT scan. Prior to discharge pain was under control, tolerating PO intake and had no episodes of V/D. Patient had elevated HR that came down after fluids. Given improved HR, symptoms, and stable presentation  throughout stay.  Sent patient with return instructions and follow up.I ordered medication including Zofran  for fluids  Reevaluation of the patient after these medicines showed that the patient improved Patients vitals assessed. Upon arrival patient is hemodynamically stable.  I have reviewed the patients home medicines and have made adjustments as needed    Consult: None  Plan: Patient stable for discharge.  Discussed diagnosis, all questions answered. Discussed return precautions  Follow-up with primary care. Return to the emergency room with any new or worsening symptoms. Augmentin to the pharmacy, take as prescribed - discussed staying hydrated and BRAT diet.           Final  Clinical Impression(s) / ED Diagnoses Final diagnoses:  Colitis    Rx / DC Orders ED Discharge Orders     None         Smitty Knudsen, PA-C 07/16/23 1950    Rolan Bucco, MD 07/19/23 [phone]

## 2023-07-16 NOTE — ED Triage Notes (Signed)
LLQ pain that started a few days ago. Vomiting the first day. Nausea and diarrhea today. Pt states pain radiates into back. Denies issues urinating or painful urination.

## 2023-07-16 NOTE — Telephone Encounter (Signed)
Noted  

## 2023-07-16 NOTE — Telephone Encounter (Signed)
I called and spoke with patient and he said that he was advised to go to the ED by nurse triage and that he is on his way there now.

## 2023-07-16 NOTE — Telephone Encounter (Signed)
Called the patient with NT to address symptoms sent in [facility] message. NT will call the pt back once with the next available nurse

## 2023-07-16 NOTE — Discharge Instructions (Signed)
You are seen in the emergency room today for abdominal pain.  Your CT showed findings consistent with colitis.  I have prescribed an antibiotic and some Zofran for nausea to your pharmacy.  Please take as prescribed.  I would recommend staying well hydrated with water, alternating Gatorade and Pedialyte.   Please call your primary care provider and schedule follow-up.  Return to the emergency room if you have any new or worsening symptoms.

## 2023-07-19 ENCOUNTER — Telehealth: Payer: Self-pay

## 2023-07-19 NOTE — Transitions of Care (Post Inpatient/ED Visit) (Signed)
   07/19/2023  Name: STEPHANE NIEMANN MRN: [id number] DOB: 1956/05/14  Today's TOC FU Call Status: Today's TOC FU Call Status:: Successful TOC FU Call Completed TOC FU Call Complete Date: 07/19/23 Patient's Name and Date of Birth confirmed.  Transition Care Management Follow-up Telephone Call Date of Discharge: 07/16/23 Discharge Facility: [facility]) Type of Discharge: Emergency Department How have you been since you were released from the hospital?: Better  Items Reviewed: Did you receive and understand the discharge instructions provided?: Yes Any new allergies since your discharge?: No Dietary orders reviewed?: Yes Do you have support at home?: Yes  Medications Reviewed Today: Medications Reviewed Today   Medications were not reviewed in this encounter     Home Care and Equipment/Supplies: Were Home Health Services Ordered?: NA Any new equipment or medical supplies ordered?: NA  Functional Questionnaire: Do you need assistance with bathing/showering or dressing?: No Do you need assistance with meal preparation?: No Do you need assistance with eating?: No Do you have difficulty maintaining continence: No Do you need assistance with getting out of bed/getting out of a chair/moving?: No Do you have difficulty managing or taking your medications?: No  Follow up appointments reviewed: PCP Follow-up appointment confirmed?: Yes Date of PCP follow-up appointment?: 07/26/23 Follow-up Provider: [facility] Follow-up appointment confirmed?: No Do you need transportation to your follow-up appointment?: No Do you understand care options if your condition(s) worsen?: Yes-patient verbalized understanding    SIGNATURE Arvil Persons, BSN, RN

## 2023-07-26 ENCOUNTER — Ambulatory Visit (INDEPENDENT_AMBULATORY_CARE_PROVIDER_SITE_OTHER): Payer: Medicare Other | Admitting: Nurse Practitioner

## 2023-07-26 ENCOUNTER — Encounter: Payer: Self-pay | Admitting: Nurse Practitioner

## 2023-07-26 VITALS — BP 124/62 | HR 73 | Temp 97.8°F | Ht 70.0 in | Wt 226.0 lb

## 2023-07-26 DIAGNOSIS — R7303 Prediabetes: Secondary | ICD-10-CM | POA: Insufficient documentation

## 2023-07-26 DIAGNOSIS — R1032 Left lower quadrant pain: Secondary | ICD-10-CM | POA: Insufficient documentation

## 2023-07-26 NOTE — Assessment & Plan Note (Signed)
He was diagnosed with colitis on 07/16/23 after going to the ER for bloody diarrhea. He was treated with IV fluids and zofran. He has been eating a bland diet since then. The diarrhea has stopped and is now having some trouble with constipation. Will have him start miralax daily as needed and drink plenty of fluids. He can slowly add back in fruits and vegetables. He should try to limit added sugars and greasy/fried foods. Follow-up with any concerns or worsening symptoms.

## 2023-07-26 NOTE — Progress Notes (Signed)
Established Patient Office Visit  Subjective   Patient ID: Scott Townsend, male    DOB: 03/02/1956  Age: 67 y.o. MRN: [id number]  Chief Complaint  Patient presents with   Hospitalization Follow-up    Lower left abdominal pain, confused on what he can eat    HPI  Scott Townsend is here to follow-up on colitis after ER visit 07/16/23.   He states that 2 weeks ago, on 07/16/23 he woke up with severe LLQ abdominal pain and sweating.  A few hours later, he had diarrhea and when he wiped he noticed bright red blood on the toilet tissue. He states that very time he went to the bathroom, he had bright red blood with diarrhea and blood in the toilet.   He went to the ER on 07/16/23 and he had a CT scan and got IV fluids. The CT scan showed colitis. He was told to follow the BRAT diet. He states that now, he is having fatigue. He states that he is still having a discomfort in his LLQ, however it is not pain. Now, he is having trouble with constipation. He is still following a bland diet.     ROS See pertinent positives and negatives per HPI.    Objective:     BP 124/62 (BP Location: Left Arm)   Pulse 73   Temp 97.8 F (36.6 C)   Ht 5\' 10"  (1.778 m)   Wt 226 lb (102.5 kg)   SpO2 98%   BMI 32.43 kg/m    Physical Exam Vitals and nursing note reviewed.  Constitutional:      Appearance: Normal appearance.  HENT:     Head: Normocephalic.  Eyes:     Conjunctiva/sclera: Conjunctivae normal.  Cardiovascular:     Rate and Rhythm: Normal rate and regular rhythm.     Pulses: Normal pulses.     Heart sounds: Normal heart sounds.  Pulmonary:     Effort: Pulmonary effort is normal.     Breath sounds: Normal breath sounds.  Abdominal:     General: There is no distension.     Palpations: Abdomen is soft.     Tenderness: There is abdominal tenderness (LLQ). There is no guarding.     Hernia: No hernia is present.  Musculoskeletal:     Cervical back: Normal range of motion.   Skin:    General: Skin is warm.  Neurological:     General: No focal deficit present.     Mental Status: He is alert and oriented to person, place, and time.  Psychiatric:        Mood and Affect: Mood normal.        Behavior: Behavior normal.        Thought Content: Thought content normal.        Judgment: Judgment normal.    The 10-year ASCVD risk score (Arnett DK, et al., 2019) is: 12.4%    Assessment & Plan:   Problem List Items Addressed This Visit       Other   LLQ pain - Primary    He was diagnosed with colitis on 07/16/23 after going to the ER for bloody diarrhea. He was treated with IV fluids and zofran. He has been eating a bland diet since then. The diarrhea has stopped and is now having some trouble with constipation. Will have him start miralax daily as needed and drink plenty of fluids. He can slowly add back in fruits and vegetables. He  should try to limit added sugars and greasy/fried foods. Follow-up with any concerns or worsening symptoms.       Prediabetes    Blood sugars still slightly elevated consistent with prediabetes. Continue to focus on nutrition and limit added sugars and carbs.        Return if symptoms worsen or fail to improve.    [name], NP

## 2023-07-26 NOTE — Patient Instructions (Signed)
It was great to see you!  Keep drinking plenty of water. You can slowly start adding back in red meat, fruits, and vegetables.   You can take miralax as needed for constipation.   Let's follow-up at your next scheduled visit, sooner with concerns.   Take care,  Rodman Pickle, NP

## 2023-07-26 NOTE — Assessment & Plan Note (Signed)
Blood sugars still slightly elevated consistent with prediabetes. Continue to focus on nutrition and limit added sugars and carbs.

## 2023-07-29 ENCOUNTER — Other Ambulatory Visit: Payer: Self-pay | Admitting: Nurse Practitioner

## 2023-07-30 ENCOUNTER — Other Ambulatory Visit: Payer: Self-pay | Admitting: Nurse Practitioner

## 2023-08-04 ENCOUNTER — Encounter: Payer: Self-pay | Admitting: Nurse Practitioner

## 2023-08-05 MED ORDER — PREGABALIN 75 MG PO CAPS: 75.0000 mg | ORAL_CAPSULE | Freq: Two times a day (BID) | ORAL | 1 refills | Status: DC

## 2023-08-09 ENCOUNTER — Encounter: Payer: Self-pay | Admitting: Nurse Practitioner

## 2023-08-09 ENCOUNTER — Ambulatory Visit: Payer: Medicare Other | Admitting: Nurse Practitioner

## 2023-08-09 VITALS — BP 135/65 | HR 63 | Temp 96.2°F | Resp 18 | Ht 70.0 in | Wt 230.0 lb

## 2023-08-09 DIAGNOSIS — R7303 Prediabetes: Secondary | ICD-10-CM

## 2023-08-09 DIAGNOSIS — M25552 Pain in left hip: Secondary | ICD-10-CM | POA: Diagnosis not present

## 2023-08-09 DIAGNOSIS — Z23 Encounter for immunization: Secondary | ICD-10-CM

## 2023-08-09 DIAGNOSIS — I10 Essential (primary) hypertension: Secondary | ICD-10-CM | POA: Diagnosis not present

## 2023-08-09 DIAGNOSIS — I480 Paroxysmal atrial fibrillation: Secondary | ICD-10-CM | POA: Diagnosis not present

## 2023-08-09 DIAGNOSIS — R351 Nocturia: Secondary | ICD-10-CM

## 2023-08-09 DIAGNOSIS — G8929 Other chronic pain: Secondary | ICD-10-CM

## 2023-08-09 MED ORDER — DULOXETINE HCL 30 MG PO CPEP: 30.0000 mg | ORAL_CAPSULE | Freq: Every day | ORAL | 1 refills | Status: DC

## 2023-08-09 NOTE — Patient Instructions (Addendum)
It was great to see you!  You can schedule a lab visit or do them when you come back in 4-6 weeks.   During your visit, we discussed your persistent left hip pain, your history of colitis, and your prediabetic condition. We also discussed your general health and planned for some vaccinations.  YOUR PLAN:  They report that the pain is similar to when it first started, despite receiving a cortisone shot about four weeks ago. The pain radiates down the back of their leg and is present even when they are sitting. They have had imaging studies and have been told by their orthopedic doctor that there is a rupture and a tear in the hip. They have been receiving cortisone shots every three months, which provide some temporary relief, but the pain returns. They are currently taking Lyrica twice daily, which has improved the side effects but has not significantly alleviated the pain. They have previously tried gabapentin, but experienced severe side effects.  In addition to the hip pain, the patient also reports occasional lower back pain. They have been receiving treatment for their right knee, which is completely bone on bone due to arthritis. They have received soft gel injections for the knee, which have been effective.  Let's follow-up in 4-6 weeks, sooner if you have concerns.  If a referral was placed today, you will be contacted for an appointment. Please note that routine referrals can sometimes take up to 3-4 weeks to process. Please call our office if you haven't heard anything after this time frame.  Take care,  Rodman Pickle, NP

## 2023-08-09 NOTE — Assessment & Plan Note (Signed)
Chronic, stable. Continue hydralazine 25mg  TID, and ramipril 10mg  daily. Check CMP, CBC, lipid panel today.

## 2023-08-09 NOTE — Progress Notes (Signed)
BP 135/65 (BP Location: Right Arm, Patient Position: Sitting, Cuff Size: Large) Comment: recheck BP reading  Pulse 63   Temp (!) 96.2 F (35.7 C) (Temporal)   Resp 18   Ht 5\' 10"  (1.778 m)   Wt 230 lb (104.3 kg)   SpO2 97%   BMI 33.00 kg/m    Subjective:    Patient ID: Scott Townsend, male    DOB: 20-Jan-1956, 67 y.o.   MRN: [id number]  CC: Chief Complaint  Patient presents with   Annual Exam    PT is here for annual exam    HPI: Scott Townsend is a 67 y.o. male presenting on 08/09/2023 for comprehensive medical examination. Current medical complaints include: left hip pain  Discussed the use of AI scribe software for clinical note transcription with the patient, who gave verbal consent to proceed.  History of Present Illness         They report that the left hip pain is similar to when it first started, despite receiving a cortisone shot about four weeks ago. The pain radiates down the back of their leg and is present even when they are sitting. They have had imaging studies and have been told by their orthopedic doctor that there is a rupture and a tear in the hip. They have been receiving cortisone shots every three months, which provide some temporary relief, but the pain returns. They are currently taking Lyrica twice daily, which has improved the side effects but has not significantly alleviated the pain. They have previously tried gabapentin, but experienced severe side effects.  In addition to the hip pain, the patient also reports occasional lower back pain. They have been receiving treatment for their right knee, which is completely bone on bone due to arthritis. They have received soft gel injections for the knee, which have been effective.  He currently lives with: wife  Depression and Anxiety Screen done today and results listed below:     08/09/2023    8:14 AM 08/09/2023    8:13 AM 05/20/2023    2:47 PM 12/04/2022    8:03 AM  Depression screen PHQ 2/9   Decreased Interest 0 0 0 0  Down, Depressed, Hopeless 0 0 0 0  PHQ - 2 Score 0 0 0 0  Altered sleeping 3   2  Tired, decreased energy 1   0  Change in appetite 0   0  Feeling bad or failure about yourself  0   0  Trouble concentrating 0   0  Moving slowly or fidgety/restless 1   0  Suicidal thoughts 0   0  PHQ-9 Score 5   2  Difficult doing work/chores Not difficult at all   Not difficult at all      08/09/2023    8:14 AM 12/04/2022    8:04 AM  GAD 7 : Generalized Anxiety Score  Nervous, Anxious, on Edge 0 2  Control/stop worrying 0 2  Worry too much - different things 0 2  Trouble relaxing 0 1  Restless 0 1  Easily annoyed or irritable 0 1  Afraid - awful might happen 0 0  Total GAD 7 Score 0 9  Anxiety Difficulty Not difficult at all Not difficult at all    The patient does not have a history of falls. I did not complete a risk assessment for falls. A plan of care for falls was not documented.   Past Medical History:  Past Medical  History:  Diagnosis Date   Arthritis    Atrial fibrillation (HCC)    Barrett esophagus    GERD (gastroesophageal reflux disease)    Hypertension     Surgical History:  Past Surgical History:  Procedure Laterality Date   CARDIOVERSION      Medications:  Current Outpatient Medications on File Prior to Visit  Medication Sig   diltiazem (CARDIZEM CD) 300 MG 24 hr capsule Take 1 capsule (300 mg total) by mouth daily.   diphenhydramine-acetaminophen (TYLENOL PM) 25-500 MG TABS Take 1 tablet by mouth daily.   flecainide (TAMBOCOR) 100 MG tablet Take 1 tablet (100 mg total) by mouth 2 (two) times daily.   hydrALAZINE (APRESOLINE) 25 MG tablet Take 1 tablet (25 mg total) by mouth 3 (three) times daily.   lansoprazole (PREVACID) 30 MG capsule Take 30 mg by mouth daily.   ramipril (ALTACE) 10 MG capsule Take 1 capsule (10 mg total) by mouth 2 (two) times daily.   No current facility-administered medications on file prior to visit.     Allergies:  Allergies  Allergen Reactions   Percocet [Oxycodone-Acetaminophen] Nausea And Vomiting   Vicodin [Hydrocodone-Acetaminophen] Nausea Only    Social History:  Social History   Socioeconomic History   Marital status: Married    Spouse name: Not on file   Number of children: 3   Years of education: Not on file   Highest education level: 12th grade  Occupational History    Employer: [employer]  Tobacco Use   Smoking status: Former    Current packs/day: 0.00    Average packs/day: 1 pack/day for 25.0 years (25.0 ttl pk-yrs)    Types: Cigarettes    Start date: 07/21/1987    Quit date: 07/20/2012    Years since quitting: 11.0   Smokeless tobacco: Never   Tobacco comments:    Off and on   Vaping Use   Vaping status: Never Used  Substance and Sexual Activity   Alcohol use: Yes    Alcohol/week: 3.0 standard drinks of alcohol    Types: 3 Shots of liquor per week    Comment: occ   Drug use: No   Sexual activity: Not Currently  Other Topics Concern   Not on file  Social History Narrative   Not on file   Social Determinants of Health   Financial Resource Strain: Low Risk  (02/04/2023)   Overall Financial Resource Strain (CARDIA)    Difficulty of Paying Living Expenses: Not very hard  Food Insecurity: No Food Insecurity (02/04/2023)   Hunger Vital Sign    Worried About Running Out of Food in the Last Year: Never true    Ran Out of Food in the Last Year: Never true  Transportation Needs: No Transportation Needs (02/04/2023)   PRAPARE - Administrator, Civil Service (Medical): No    Lack of Transportation (Non-Medical): No  Physical Activity: Sufficiently Active (02/04/2023)   Exercise Vital Sign    Days of Exercise per Week: 5 days    Minutes of Exercise per Session: 60 min  Stress: No Stress Concern Present (02/04/2023)   [employer] of Occupational Health - Occupational Stress Questionnaire    Feeling of Stress : Only a little  Social  Connections: Moderately Integrated (02/04/2023)   Social Connection and Isolation Panel [NHANES]    Frequency of Communication with Friends and Family: More than three times a week    Frequency of Social Gatherings with Friends and Family: Twice a week  Attends Religious Services: More than 4 times per year    Active Member of Clubs or Organizations: No    Attends Engineer, structural: Not on file    Marital Status: Married  Catering manager Violence: Not on file   Social History   Tobacco Use  Smoking Status Former   Current packs/day: 0.00   Average packs/day: 1 pack/day for 25.0 years (25.0 ttl pk-yrs)   Types: Cigarettes   Start date: 07/21/1987   Quit date: 07/20/2012   Years since quitting: 11.0  Smokeless Tobacco Never  Tobacco Comments   Off and on    Social History   Substance and Sexual Activity  Alcohol Use Yes   Alcohol/week: 3.0 standard drinks of alcohol   Types: 3 Shots of liquor per week   Comment: occ    Family History:  Family History  Problem Relation Age of Onset   Deep vein thrombosis Mother    Varicose Veins Mother    Alcohol abuse Father    Heart attack Father 30   Stroke Father 80    Past medical history, surgical history, medications, allergies, family history and social history reviewed with patient today and changes made to appropriate areas of the chart.   Review of Systems  Constitutional:  Positive for malaise/fatigue. Negative for fever.  HENT: Negative.    Respiratory:  Positive for shortness of breath (intermittent).   Cardiovascular: Negative.   Gastrointestinal: Negative.   Genitourinary: Negative.   Musculoskeletal:  Positive for joint pain (left hip).  Skin: Negative.   Neurological: Negative.   Psychiatric/Behavioral: Negative.     All other ROS negative except what is listed above and in the HPI.      Objective:    BP 135/65 (BP Location: Right Arm, Patient Position: Sitting, Cuff Size: Large) Comment:  recheck BP reading  Pulse 63   Temp (!) 96.2 F (35.7 C) (Temporal)   Resp 18   Ht 5\' 10"  (1.778 m)   Wt 230 lb (104.3 kg)   SpO2 97%   BMI 33.00 kg/m   Wt Readings from Last 3 Encounters:  08/09/23 230 lb (104.3 kg)  07/26/23 226 lb (102.5 kg)  07/16/23 225 lb (102.1 kg)    Physical Exam Vitals and nursing note reviewed.  Constitutional:      General: He is not in acute distress.    Appearance: Normal appearance.  HENT:     Head: Normocephalic and atraumatic.     Right Ear: Tympanic membrane, ear canal and external ear normal.     Left Ear: Tympanic membrane, ear canal and external ear normal.     Mouth/Throat:     Mouth: Mucous membranes are moist.     Pharynx: No posterior oropharyngeal erythema.  Eyes:     Conjunctiva/sclera: Conjunctivae normal.  Cardiovascular:     Rate and Rhythm: Normal rate and regular rhythm.     Pulses: Normal pulses.     Heart sounds: Normal heart sounds.  Pulmonary:     Effort: Pulmonary effort is normal.     Breath sounds: Normal breath sounds.  Abdominal:     Palpations: Abdomen is soft.     Tenderness: There is no abdominal tenderness.  Musculoskeletal:        General: Tenderness (left hip) present. Normal range of motion.     Cervical back: Normal range of motion and neck supple. No tenderness.     Right lower leg: No edema.     Left lower leg:  No edema.  Lymphadenopathy:     Cervical: No cervical adenopathy.  Skin:    General: Skin is warm and dry.  Neurological:     General: No focal deficit present.     Mental Status: He is alert and oriented to person, place, and time.     Cranial Nerves: No cranial nerve deficit.     Coordination: Coordination normal.     Gait: Gait normal.  Psychiatric:        Mood and Affect: Mood normal.        Behavior: Behavior normal.        Thought Content: Thought content normal.        Judgment: Judgment normal.     Results for orders placed or performed during the hospital encounter of  07/16/23  Comprehensive metabolic panel  Result Value Ref Range   Sodium 133 (L) 135 - 145 mmol/L   Potassium 3.4 (L) 3.5 - 5.1 mmol/L   Chloride 102 98 - 111 mmol/L   CO2 23 22 - 32 mmol/L   Glucose, Bld 108 (H) 70 - 99 mg/dL   BUN 15 8 - 23 mg/dL   Creatinine, Ser 4.69 0.61 - 1.24 mg/dL   Calcium 8.5 (L) 8.9 - 10.3 mg/dL   Total Protein 6.6 6.5 - 8.1 g/dL   Albumin 3.7 3.5 - 5.0 g/dL   AST 18 15 - 41 U/L   ALT 21 0 - 44 U/L   Alkaline Phosphatase 50 38 - 126 U/L   Total Bilirubin 0.9 0.3 - 1.2 mg/dL   GFR, Estimated >62 >95 mL/min   Anion gap 8 5 - 15  Lipase, blood  Result Value Ref Range   Lipase 27 11 - 51 U/L  CBC with Differential  Result Value Ref Range   WBC 15.6 (H) 4.0 - 10.5 K/uL   RBC 4.69 4.22 - 5.81 MIL/uL   Hemoglobin 14.8 13.0 - 17.0 g/dL   HCT 28.4 13.2 - 44.0 %   MCV 93.2 80.0 - 100.0 fL   MCH 31.6 26.0 - 34.0 pg   MCHC 33.9 30.0 - 36.0 g/dL   RDW 10.2 72.5 - 36.6 %   Platelets 180 150 - 400 K/uL   nRBC 0.0 0.0 - 0.2 %   Neutrophils Relative % 77 %   Neutro Abs 12.1 (H) 1.7 - 7.7 K/uL   Lymphocytes Relative 11 %   Lymphs Abs 1.7 0.7 - 4.0 K/uL   Monocytes Relative 11 %   Monocytes Absolute 1.7 (H) 0.1 - 1.0 K/uL   Eosinophils Relative 0 %   Eosinophils Absolute 0.0 0.0 - 0.5 K/uL   Basophils Relative 0 %   Basophils Absolute 0.0 0.0 - 0.1 K/uL   Immature Granulocytes 1 %   Abs Immature Granulocytes 0.08 (H) 0.00 - 0.07 K/uL  I-Stat CG4 Lactic Acid  Result Value Ref Range   Lactic Acid, Venous 0.7 0.5 - 1.9 mmol/L      Assessment & Plan:   Problem List Items Addressed This Visit       Cardiovascular and Mediastinum   Paroxysmal atrial fibrillation (HCC) - Primary    Chronic, stable.  He is currently following with cardiology.  Will have him continue diltiazem 300 mg daily and flecainide 100 mg twice a day.  He was taking Coumadin, however did not like the way it made him feel.  He declines anticoagulation at this point.  Will have him  continue to follow-up with cardiology routinely.  Essential hypertension    Chronic, stable. Continue hydralazine 25mg  TID, and ramipril 10mg  daily. Check CMP, CBC, lipid panel today.       Relevant Orders   CBC with Differential/Platelet   Comprehensive metabolic panel   Lipid panel     Other   Prediabetes    Chronic, stable. Check A1c today. Continue focus on limiting sugars.       Relevant Orders   Hemoglobin A1c   Chronic left hip pain    Chronic, not controlled. They will discontinue Lyrica over two weeks, reducing to one capsule daily for two weeks, then stopping due to chronic pain with radiation down the leg and an MRI showing rupture and tear. Cortisone shots have provided temporary relief, but Lyrica has not been significantly effective. We will start Cymbalta 30mg  daily for pain management and consider over-the-counter lidocaine patches for additional pain relief. Follow-up in 4-6 weeks.       Relevant Medications   DULoxetine (CYMBALTA) 30 MG capsule   Other Visit Diagnoses     Immunization due       Prevnar 20 given today   Relevant Orders   Pneumococcal conjugate vaccine 20-valent   Nocturia       Will check PSA today   Relevant Orders   PSA        LABORATORY TESTING:  Health maintenance labs ordered today as discussed above.   The natural history of prostate cancer and ongoing controversy regarding screening and potential treatment outcomes of prostate cancer has been discussed with the patient. The meaning of a false positive PSA and a false negative PSA has been discussed. He indicates understanding of the limitations of this screening test and wishes to proceed with screening PSA testing.   IMMUNIZATIONS:   - Tdap: Tetanus vaccination status reviewed: last tetanus booster within 10 years. - Influenza: Up to date - Pneumovax: Not applicable - Prevnar: Administered today - HPV: Not applicable - Shingrix vaccine:  going to get from  pharmacy  SCREENING: - Colonoscopy: Up to date  Discussed with patient purpose of the colonoscopy is to detect colon cancer at curable precancerous or early stages   - AAA Screening: Not applicable   PATIENT COUNSELING:    Sexuality: Discussed sexually transmitted diseases, partner selection, use of condoms, avoidance of unintended pregnancy  and contraceptive alternatives.   Advised to avoid cigarette smoking.  I discussed with the patient that most people either abstain from alcohol or drink within safe limits (<=14/week and <=4 drinks/occasion for males, <=7/weeks and <= 3 drinks/occasion for females) and that the risk for alcohol disorders and other health effects rises proportionally with the number of drinks per week and how often a drinker exceeds daily limits.  Discussed cessation/primary prevention of drug use and availability of treatment for abuse.   Diet: Encouraged to adjust caloric intake to maintain  or achieve ideal body weight, to reduce intake of dietary saturated fat and total fat, to limit sodium intake by avoiding high sodium foods and not adding table salt, and to maintain adequate dietary potassium and calcium preferably from fresh fruits, vegetables, and low-fat dairy products.    stressed the importance of regular exercise  Injury prevention: Discussed safety belts, safety helmets, smoke detector, smoking near bedding or upholstery.   Dental health: Discussed importance of regular tooth brushing, flossing, and dental visits.   Follow up plan: NEXT PREVENTATIVE PHYSICAL DUE IN 1 YEAR. Return in about 4 weeks (around 09/06/2023) for 4-6 weeks, left hip pain.  Otie Headlee A Sansa Alkema

## 2023-08-09 NOTE — Assessment & Plan Note (Signed)
Chronic, stable. Check A1c today. Continue focus on limiting sugars.

## 2023-08-09 NOTE — Assessment & Plan Note (Addendum)
Chronic, not controlled. They will discontinue Lyrica over two weeks, reducing to one capsule daily for two weeks, then stopping due to chronic pain with radiation down the leg and an MRI showing rupture and tear. Cortisone shots have provided temporary relief, but Lyrica has not been significantly effective. We will start Cymbalta 30mg  daily for pain management and consider over-the-counter lidocaine patches for additional pain relief. Follow-up in 4-6 weeks.

## 2023-08-09 NOTE — Assessment & Plan Note (Signed)
Chronic, stable.  He is currently following with cardiology.  Will have him continue diltiazem 300 mg daily and flecainide 100 mg twice a day.  He was taking Coumadin, however did not like the way it made him feel.  He declines anticoagulation at this point.  Will have him continue to follow-up with cardiology routinely.

## 2023-08-10 ENCOUNTER — Encounter: Payer: Self-pay | Admitting: Nurse Practitioner

## 2023-08-11 ENCOUNTER — Other Ambulatory Visit: Payer: Medicare Other

## 2023-08-11 DIAGNOSIS — R7303 Prediabetes: Secondary | ICD-10-CM | POA: Diagnosis not present

## 2023-08-11 DIAGNOSIS — I10 Essential (primary) hypertension: Secondary | ICD-10-CM

## 2023-08-11 DIAGNOSIS — R351 Nocturia: Secondary | ICD-10-CM

## 2023-08-11 LAB — LIPID PANEL
Cholesterol: 142 mg/dL (ref 0–200)
HDL: 54.1 mg/dL (ref >39.00)
LDL Cholesterol: 75 mg/dL (ref 0–99)
NonHDL: 88.19
Total CHOL/HDL Ratio: 3
Triglycerides: 64 mg/dL (ref 0.0–149.0)
VLDL: 12.8 mg/dL (ref 0.0–40.0)

## 2023-08-11 LAB — COMPREHENSIVE METABOLIC PANEL
ALT: 19 U/L (ref 0–53)
AST: 16 U/L (ref 0–37)
Albumin: 4.1 g/dL (ref 3.5–5.2)
Alkaline Phosphatase: 58 U/L (ref 39–117)
BUN: 11 mg/dL (ref 6–23)
CO2: 26 meq/L (ref 19–32)
Calcium: 8.9 mg/dL (ref 8.4–10.5)
Chloride: 104 meq/L (ref 96–112)
Creatinine, Ser: 0.95 mg/dL (ref 0.40–1.50)
GFR: 83.09 mL/min (ref >60.00)
Glucose, Bld: 114 mg/dL — ABNORMAL HIGH (ref 70–99)
Potassium: 3.7 meq/L (ref 3.5–5.1)
Sodium: 138 meq/L (ref 135–145)
Total Bilirubin: 0.4 mg/dL (ref 0.2–1.2)
Total Protein: 6.3 g/dL (ref 6.0–8.3)

## 2023-08-11 LAB — CBC WITH DIFFERENTIAL/PLATELET
Basophils Absolute: 0 10*3/uL (ref 0.0–0.1)
Basophils Relative: 0.6 % (ref 0.0–3.0)
Eosinophils Absolute: 0.1 10*3/uL (ref 0.0–0.7)
Eosinophils Relative: 1.2 % (ref 0.0–5.0)
HCT: 41.3 % (ref 39.0–52.0)
Hemoglobin: 13.8 g/dL (ref 13.0–17.0)
Lymphocytes Relative: 20 % (ref 12.0–46.0)
Lymphs Abs: 1.4 10*3/uL (ref 0.7–4.0)
MCHC: 33.4 g/dL (ref 30.0–36.0)
MCV: 93.6 fL (ref 78.0–100.0)
Monocytes Absolute: 0.9 10*3/uL (ref 0.1–1.0)
Monocytes Relative: 12.9 % — ABNORMAL HIGH (ref 3.0–12.0)
Neutro Abs: 4.7 10*3/uL (ref 1.4–7.7)
Neutrophils Relative %: 65.3 % (ref 43.0–77.0)
Platelets: 221 10*3/uL (ref 150.0–400.0)
RBC: 4.42 Mil/uL (ref 4.22–5.81)
RDW: 13.3 % (ref 11.5–15.5)
WBC: 7.2 10*3/uL (ref 4.0–10.5)

## 2023-08-11 LAB — HEMOGLOBIN A1C: Hgb A1c MFr Bld: 5.9 % (ref 4.6–6.5)

## 2023-08-11 LAB — PSA: PSA: 1.64 ng/mL (ref 0.10–4.00)

## 2023-08-11 MED ORDER — NORTRIPTYLINE HCL 10 MG PO CAPS: 10.0000 mg | ORAL_CAPSULE | Freq: Every day | ORAL | 1 refills | Status: DC

## 2023-08-11 NOTE — Addendum Note (Signed)
Addended by: Rodman Pickle A on: 08/11/2023 11:21 AM   Modules accepted: Orders

## 2023-08-12 MED ORDER — PREGABALIN 75 MG PO CAPS: 75.0000 mg | ORAL_CAPSULE | Freq: Three times a day (TID) | ORAL | 0 refills | Status: DC

## 2023-08-12 NOTE — Addendum Note (Signed)
Addended by: Rodman Pickle A on: 08/12/2023 10:39 AM   Modules accepted: Orders

## 2023-09-06 ENCOUNTER — Encounter: Payer: Self-pay | Admitting: Nurse Practitioner

## 2023-09-06 ENCOUNTER — Ambulatory Visit (INDEPENDENT_AMBULATORY_CARE_PROVIDER_SITE_OTHER): Payer: Medicare Other | Admitting: Nurse Practitioner

## 2023-09-06 VITALS — BP 136/80 | HR 57 | Temp 97.8°F | Ht 70.0 in | Wt 230.0 lb

## 2023-09-06 DIAGNOSIS — M25552 Pain in left hip: Secondary | ICD-10-CM

## 2023-09-06 DIAGNOSIS — G8929 Other chronic pain: Secondary | ICD-10-CM

## 2023-09-06 DIAGNOSIS — I10 Essential (primary) hypertension: Secondary | ICD-10-CM

## 2023-09-06 DIAGNOSIS — R7303 Prediabetes: Secondary | ICD-10-CM | POA: Diagnosis not present

## 2023-09-06 MED ORDER — PREGABALIN 100 MG PO CAPS: 100.0000 mg | ORAL_CAPSULE | Freq: Three times a day (TID) | ORAL | 2 refills | Status: DC

## 2023-09-06 NOTE — Assessment & Plan Note (Signed)
Chronic, stable. A1c 5.9%. Continue focusing on nutrition and exercise.

## 2023-09-06 NOTE — Assessment & Plan Note (Signed)
Chronic, stable. Continue hydralazine 25mg  TID, and ramipril 10mg  daily. Recent CMP, CBC, and lipid panel reviewed.

## 2023-09-06 NOTE — Patient Instructions (Signed)
It was great to see you!  Increase your lyrica to 100mg  3 times a day.   Let's follow-up in 6 months, sooner if you have concerns.  If a referral was placed today, you will be contacted for an appointment. Please note that routine referrals can sometimes take up to 3-4 weeks to process. Please call our office if you haven't heard anything after this time frame.  Take care,  Rodman Pickle, NP

## 2023-09-06 NOTE — Progress Notes (Signed)
Established Patient Office Visit  Subjective   Patient ID: Scott Townsend, male    DOB: 1956/05/19  Age: 67 y.o. MRN: [id number]  Chief Complaint  Patient presents with   Left Hip Pain    Follow up     HPI  Discussed the use of AI scribe software for clinical note transcription with the patient, who gave verbal consent to proceed.  History of Present Illness   The patient, with a history of hip pain, colitis, and pre-diabetes, reports an improvement in hip pain with an increase in Lyrica dosage. The patient describes the pain as tolerable and notes an increased ability to sit for longer periods and improved sleep. The patient has not experienced any side effects from the medication.  The patient's colitis has been stable with no recent flare-ups. The patient has made dietary changes, including reducing sugar intake and increasing water consumption, to manage his health conditions. The patient is also pre-diabetic and has been working on managing his glucose levels through dietary changes. The patient is active, walks regularly, and has been maintaining his weight.        ROS See pertinent positives and negatives per HPI.    Objective:     BP 136/80 (BP Location: Left Arm)   Pulse (!) 57   Temp 97.8 F (36.6 C)   Ht 5\' 10"  (1.778 m)   Wt 230 lb (104.3 kg)   SpO2 96%   BMI 33.00 kg/m    Physical Exam Vitals and nursing note reviewed.  Constitutional:      Appearance: Normal appearance.  HENT:     Head: Normocephalic.  Eyes:     Conjunctiva/sclera: Conjunctivae normal.  Cardiovascular:     Rate and Rhythm: Normal rate and regular rhythm.     Pulses: Normal pulses.     Heart sounds: Normal heart sounds.  Pulmonary:     Effort: Pulmonary effort is normal.     Breath sounds: Normal breath sounds.  Musculoskeletal:     Cervical back: Normal range of motion.  Skin:    General: Skin is warm.  Neurological:     General: No focal deficit present.     Mental  Status: He is alert and oriented to person, place, and time.  Psychiatric:        Mood and Affect: Mood normal.        Behavior: Behavior normal.        Thought Content: Thought content normal.        Judgment: Judgment normal.    The 10-year ASCVD risk score (Arnett DK, et al., 2019) is: 14.7%    Assessment & Plan:   Problem List Items Addressed This Visit       Cardiovascular and Mediastinum   Essential hypertension    Chronic, stable. Continue hydralazine 25mg  TID, and ramipril 10mg  daily. Recent CMP, CBC, and lipid panel reviewed.       Relevant Orders   CBC with Differential/Platelet   Comprehensive metabolic panel   Lipid panel     Other   Prediabetes    Chronic, stable. A1c 5.9%. Continue focusing on nutrition and exercise.      Relevant Orders   Hemoglobin A1c   Chronic left hip pain - Primary    Chronic, ongoing. His hip pain has improved with an increase in Lyrica, yet it persists without any side effects from the medication. We will increase Lyrica to 100mg  three times a day, up from 75mg . Should the  pharmacy not refill the prescription, he is to take two 75mg  capsules twice a day until the refill is available. Follow-up in 6 months.       Relevant Medications   pregabalin (LYRICA) 100 MG capsule    Return in about 6 months (around 03/05/2024) for HTN, prediabetes, hip pain.    [name], NP

## 2023-09-06 NOTE — Assessment & Plan Note (Signed)
Chronic, ongoing. His hip pain has improved with an increase in Lyrica, yet it persists without any side effects from the medication. We will increase Lyrica to 100mg  three times a day, up from 75mg . Should the pharmacy not refill the prescription, he is to take two 75mg  capsules twice a day until the refill is available. Follow-up in 6 months.

## 2023-11-29 ENCOUNTER — Other Ambulatory Visit: Payer: Self-pay

## 2023-11-29 MED ORDER — HYDRALAZINE HCL 25 MG PO TABS: 25.0000 mg | ORAL_TABLET | Freq: Three times a day (TID) | ORAL | 1 refills | Status: DC

## 2023-12-06 ENCOUNTER — Encounter: Payer: Self-pay | Admitting: Nurse Practitioner

## 2023-12-06 MED ORDER — PREGABALIN 100 MG PO CAPS: 100.0000 mg | ORAL_CAPSULE | Freq: Three times a day (TID) | ORAL | 2 refills | Status: DC

## 2023-12-06 NOTE — Telephone Encounter (Signed)
 Requesting: Pregabalin 100mg  Last Visit: 09/06/2023 Next Visit: 03/06/2024 Last Refill: 09/06/2023  Please Advise

## 2024-01-06 ENCOUNTER — Encounter: Payer: Self-pay | Admitting: Nurse Practitioner

## 2024-01-06 DIAGNOSIS — G8929 Other chronic pain: Secondary | ICD-10-CM

## 2024-01-25 ENCOUNTER — Ambulatory Visit: Admitting: Orthopaedic Surgery

## 2024-01-26 ENCOUNTER — Encounter: Payer: Self-pay | Admitting: Nurse Practitioner

## 2024-02-10 ENCOUNTER — Telehealth: Payer: Self-pay | Admitting: *Deleted

## 2024-02-10 NOTE — Telephone Encounter (Signed)
 Pt is scheduled to see Dr. Lavonne Prairie 03/07/24, clearance will be addressed at that time.  Will route to the requesting surgeon's office to make them aware.

## 2024-02-10 NOTE — Telephone Encounter (Signed)
   Pre-operative Risk Assessment    Patient Name: Scott Townsend  DOB: 08-24-1956 MRN: [id number]   Date of last office visit: 02/25/2024 Date of next office visit: 03/07/2024  APPOINTMENT NOTE UPDATED FOR CLEARANCE    Request for Surgical Clearance    Procedure:   RT TOTAL KNEE ARTHROPLASTY  Date of Surgery:  Clearance TBD                                Surgeon:  Priscille Brought, MD Surgeon's Group or Practice Name:  [facility] Phone number:  [phone] Fax number:  [phone]   Type of Clearance Requested:   - Medical    Type of Anesthesia:  General    Additional requests/questions:    [name]   02/10/2024, 8:39 AM

## 2024-02-10 NOTE — Telephone Encounter (Signed)
    Primary Cardiologist:James Hochrein, MD  Chart reviewed as part of pre-operative protocol coverage. Because of Scott Townsend's past medical history and time since last visit, he/she will require a follow-up visit in order to better assess preoperative cardiovascular risk.  Pre-op covering staff: - Please schedule  in office appointment and call patient to inform them. - Please contact requesting surgeon's office via preferred method (i.e, phone, fax) to inform them of need for appointment prior to surgery.  If applicable, this message will also be routed to pharmacy pool and/or primary cardiologist for input on holding anticoagulant/antiplatelet agent as requested below so that this information is available at time of patient's appointment.   Carie Charity, NP  02/10/2024, 8:41 AM

## 2024-02-13 ENCOUNTER — Encounter: Payer: Self-pay | Admitting: Nurse Practitioner

## 2024-02-13 DIAGNOSIS — L608 Other nail disorders: Secondary | ICD-10-CM

## 2024-02-21 ENCOUNTER — Ambulatory Visit: Payer: Medicare Other

## 2024-02-22 ENCOUNTER — Ambulatory Visit (INDEPENDENT_AMBULATORY_CARE_PROVIDER_SITE_OTHER): Admitting: Podiatry

## 2024-02-22 ENCOUNTER — Encounter: Payer: Self-pay | Admitting: Podiatry

## 2024-02-22 ENCOUNTER — Other Ambulatory Visit: Payer: Self-pay

## 2024-02-22 VITALS — Ht 70.0 in | Wt 230.0 lb

## 2024-02-22 DIAGNOSIS — B351 Tinea unguium: Secondary | ICD-10-CM

## 2024-02-22 MED ORDER — DILTIAZEM HCL ER COATED BEADS 300 MG PO CP24: 300.0000 mg | ORAL_CAPSULE | Freq: Every day | ORAL | 0 refills | Status: DC

## 2024-02-22 MED ORDER — TERBINAFINE HCL 250 MG PO TABS: 250.0000 mg | ORAL_TABLET | Freq: Every day | ORAL | 0 refills | Status: DC

## 2024-02-22 NOTE — Progress Notes (Signed)
 Subjective:  Patient ID: Scott Townsend, male    DOB: Townsend 17, 1957,  MRN: [id number]  Chief Complaint  Patient presents with   Nail Problem    new patient-discoloration of left hallux toenail, can be painful at times, very irritating tried creams, liquids, and bandages OTC nothing is helping    Discussed the use of AI scribe software for clinical note transcription with the patient, who gave verbal consent to proceed.  History of Present Illness Scott Townsend "Scott Townsend" is a 68 year old male who presents with toenail discoloration and dystrophy.  He has experienced toenail discoloration and dystrophy of the left hallux nail for about two years. Various topical treatments, including antifungal creams, liquids, and medicated bandages, have been attempted by his wife without success. He has not used any oral or prescription medications for this condition.  He is scheduled for a knee replacement surgery in late July or early Townsend and is concerned about whether the toenail condition might affect the surgery. He has atrial fibrillation but is not currently on blood thinners. No issues with his kidneys or liver, and recent lab work was normal. He is not on a statin as his cholesterol levels are consistently good.  No side effects from medications in the past.      Objective:    Physical Exam VASCULAR: DP and PT pulses palpable. Foot is warm and well-perfused. Capillary fill time is brisk. Venous insufficiency with varicose veins. DERMATOLOGIC: Normal skin turgor, texture, and temperature. No open lesions, rashes, or ulcerations. Onychomycosis of the left hallux nail with dystrophy. NEUROLOGIC: Normal sensation to light touch and pressure. No paresthesias. ORTHOPEDIC: Smooth, pain-free range of motion of all examined joints. No ecchymosis or bruising. No gross deformity. No pain to palpation.       Results Procedure: Toenail trimming Description: Trimmed the bulk of the  onychomycosis-affected toenail. Observed infection in the nail bed and matrix.   Assessment:   1. Onychomycosis      Plan:  Patient was evaluated and treated and all questions answered.  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 Topical treatments have been ineffective due to advanced infection with nail thickening. Oral antifungal treatment with Lamisil is recommended for its higher efficacy (80-85% success rate) compared to topical treatments (20-30% success rate). He is a suitable candidate for oral antifungal therapy as he is not on blood thinners, has no liver or kidney issues, and is not on statins. Potential side effects of Lamisil include gastrointestinal symptoms (nausea, cramping, diarrhea) and rare changes in taste and smell. The condition will not interfere with the upcoming knee replacement surgery, as it is not a bacterial infection. Laser treatment is an alternative with similar efficacy but is not covered by insurance. If oral treatment is ineffective, toenail removal may be considered. - Prescribe oral Lamisil for three months. - Monitor for side effects, particularly gastrointestinal symptoms and changes in taste or smell. - Advise him to inform the orthopedic surgeon about the fungal infection to confirm it will not affect the knee replacement surgery..  If they would prefer can remove toenail prior to surgery. - Schedule follow-up visit in four to five months. - If no improvement after a few months, consider toenail removal and regrowth while on medication.      Return in about 4 months (around 06/24/2024) for follow up after nail fungus treatment.

## 2024-02-22 NOTE — Patient Instructions (Signed)
  VISIT SUMMARY: Today, we discussed your toenail discoloration and dystrophy, which you have been experiencing for about two years. We reviewed your treatment history and concerns about your upcoming knee replacement surgery.  YOUR PLAN: -ONYCHOMYCOSIS OF LEFT HALLUX NAIL: Onychomycosis is a fungal infection of the toenail that causes discoloration and thickening. Since topical treatments have not been effective, we will start you on an oral antifungal medication called Lamisil, which has a higher success rate. You should take this medication for three months and monitor for any side effects like nausea, cramping, diarrhea, or changes in taste and smell. This condition will not affect your knee replacement surgery, but please inform your orthopedic surgeon about the infection. If the oral treatment does not work, we may consider toenail removal.  INSTRUCTIONS: Please take the prescribed oral Lamisil for three months and monitor for any side effects. Inform your orthopedic surgeon about your fungal infection to confirm it will not affect your knee replacement surgery. Schedule a follow-up visit in four to five months to assess your progress. If there is no improvement after a few months, we may consider toenail removal.                      Contains text generated by [employer].                                 Contains text generated by [employer].

## 2024-02-24 ENCOUNTER — Encounter: Payer: Self-pay | Admitting: Cardiology

## 2024-02-25 NOTE — Telephone Encounter (Signed)
 Spoke with patient, he started terbinafine  yesterday. Scheduled EKG nurse visit on Tuesday, May 13 at 2 pm. No needs at this time

## 2024-02-28 ENCOUNTER — Other Ambulatory Visit (INDEPENDENT_AMBULATORY_CARE_PROVIDER_SITE_OTHER): Payer: Medicare Other

## 2024-02-28 ENCOUNTER — Encounter: Payer: Self-pay | Admitting: Nurse Practitioner

## 2024-02-28 DIAGNOSIS — R7303 Prediabetes: Secondary | ICD-10-CM

## 2024-02-28 DIAGNOSIS — I10 Essential (primary) hypertension: Secondary | ICD-10-CM | POA: Diagnosis not present

## 2024-02-28 LAB — CBC WITH DIFFERENTIAL/PLATELET
Basophils Absolute: 0.1 10*3/uL (ref 0.0–0.1)
Basophils Relative: 0.7 % (ref 0.0–3.0)
Eosinophils Absolute: 0.1 10*3/uL (ref 0.0–0.7)
Eosinophils Relative: 1.1 % (ref 0.0–5.0)
HCT: 42.4 % (ref 39.0–52.0)
Hemoglobin: 14.2 g/dL (ref 13.0–17.0)
Lymphocytes Relative: 19.6 % (ref 12.0–46.0)
Lymphs Abs: 1.5 10*3/uL (ref 0.7–4.0)
MCHC: 33.4 g/dL (ref 30.0–36.0)
MCV: 94 fl (ref 78.0–100.0)
Monocytes Absolute: 1 10*3/uL (ref 0.1–1.0)
Monocytes Relative: 12.4 % — ABNORMAL HIGH (ref 3.0–12.0)
Neutro Abs: 5.1 10*3/uL (ref 1.4–7.7)
Neutrophils Relative %: 66.2 % (ref 43.0–77.0)
Platelets: 243 10*3/uL (ref 150.0–400.0)
RBC: 4.51 Mil/uL (ref 4.22–5.81)
RDW: 13.4 % (ref 11.5–15.5)
WBC: 7.7 10*3/uL (ref 4.0–10.5)

## 2024-02-28 LAB — COMPREHENSIVE METABOLIC PANEL WITH GFR
ALT: 21 U/L (ref 0–53)
AST: 17 U/L (ref 0–37)
Albumin: 4.1 g/dL (ref 3.5–5.2)
Alkaline Phosphatase: 59 U/L (ref 39–117)
BUN: 13 mg/dL (ref 6–23)
CO2: 23 meq/L (ref 19–32)
Calcium: 9.1 mg/dL (ref 8.4–10.5)
Chloride: 106 meq/L (ref 96–112)
Creatinine, Ser: 0.94 mg/dL (ref 0.40–1.50)
GFR: 83.82 mL/min (ref >60.00)
Glucose, Bld: 91 mg/dL (ref 70–99)
Potassium: 3.9 meq/L (ref 3.5–5.1)
Sodium: 138 meq/L (ref 135–145)
Total Bilirubin: 0.4 mg/dL (ref 0.2–1.2)
Total Protein: 6.6 g/dL (ref 6.0–8.3)

## 2024-02-28 LAB — LIPID PANEL
Cholesterol: 136 mg/dL (ref 0–200)
HDL: 58.3 mg/dL (ref >39.00)
LDL Cholesterol: 65 mg/dL (ref 0–99)
NonHDL: 77.25
Total CHOL/HDL Ratio: 2
Triglycerides: 63 mg/dL (ref 0.0–149.0)
VLDL: 12.6 mg/dL (ref 0.0–40.0)

## 2024-02-28 LAB — HEMOGLOBIN A1C: Hgb A1c MFr Bld: 5.9 % (ref 4.6–6.5)

## 2024-02-29 ENCOUNTER — Ambulatory Visit: Attending: Internal Medicine | Admitting: *Deleted

## 2024-02-29 VITALS — BP 132/74 | HR 72 | Wt 229.0 lb

## 2024-02-29 DIAGNOSIS — Z5181 Encounter for therapeutic drug level monitoring: Secondary | ICD-10-CM | POA: Diagnosis present

## 2024-02-29 NOTE — Progress Notes (Signed)
   Nurse Visit   Date of Encounter: 02/29/2024 ID: Edwena Graham II, DOB 1956/03/02, MRN [id number]  PCP:  Odette Benjamin, NP   [facility] Providers Cardiologist:  Eilleen Grates, MD      Visit Details   VS:  BP 132/74 (BP Location: Left Arm, Patient Position: Sitting, Cuff Size: Normal)   Pulse 72   Wt 229 lb (103.9 kg)   BMI 32.86 kg/m  , BMI Body mass index is 32.86 kg/m.  Wt Readings from Last 3 Encounters:  02/29/24 229 lb (103.9 kg)  02/22/24 230 lb (104.3 kg)  09/06/23 230 lb (104.3 kg)     Reason for visit: EKG Performed today:  , Vitals, EKG, Provider consulted:Dr. Carolynne Citron, and Education Changes (medications, testing, etc.) : none Length of Visit: 15 minutes  No recommended changes based on EKG.  Okay to continue antifungal medication per Dr. Carolynne Citron.  Medications Adjustments/Labs and Tests Ordered: Orders Placed This Encounter  Procedures   EKG 12-Lead   No orders of the defined types were placed in this encounter.    Wayne Haines, RN  02/29/2024 2:09 PM

## 2024-03-05 NOTE — Progress Notes (Signed)
 Cardiology Office Note:   Date:  03/07/2024  ID:  Scott Townsend, DOB Mar 27, 1956, MRN [id number] PCP: Odette Benjamin, NP  [facility] Providers Cardiologist:  Eilleen Grates, MD {  History of Present Illness:   Scott Townsend is a 68 y.o. male who presents for follow up of atrial fibrillation.  He was diagnosed with atrial fibrillation in 2013 and has been cardioverted multiple times.  He was started on flecainide .  He  went back to atrial fibrillation, he was started on Xarelto  and had ramipril  increased to 10 mg twice a day.  Unfortunately due to cost reasons, he was unable to take the Xarelto  and was switched to Coumadin .  He also has significant dizziness and fatigue on the chlorthalidone . He has had multiple afib episodes in January and April 2023.  He stopped taking Coumadin  because he could not get back for the INR's.  He has been off of anticoagulation since he was low risk and had trouble affording medications.  He comes back today.  He is going to need knee surgery.  He is now walking with a cane.  He has very short paroxysms of fibrillation once in a while lasting for less than 30 seconds.  He might get a little lightheaded with this.  However, he has had no sustained episodes.  He has a wearable that we will track his A-fib.  He denies any shortness of breath, PND or orthopnea.  He has had no chest pressure, neck or arm discomfort.  He has some chronic lower extremity swelling.  ROS: As stated in the HPI and negative for all other systems.  Studies Reviewed:    EKG:   EKG Interpretation Date/Time:  Tuesday Mar 07 2024 13:36:23 EDT Ventricular Rate:  73 PR Interval:  152 QRS Duration:  86 QT Interval:  402 QTC Calculation: 442 R Axis:   24  Text Interpretation: Sinus rhythm with occasional Premature ventricular complexes When compared with ECG of 29-Feb-2024 13:57, Premature ventricular complexes are now Present Confirmed by Eilleen Grates ([id number]) on  03/07/2024 1:47:04 PM     Risk Assessment/Calculations:    CHA2DS2-VASc Score =     This indicates a  % annual risk of stroke. The patient's score is based upon: CHF History: 0 HTN History: 1 Diabetes History: 0 Stroke History: 0 Age Score: 1 Gender Score: 0   Physical Exam:   VS:  BP 134/76 (BP Location: Left Arm, Patient Position: Sitting, Cuff Size: Normal)   Pulse 73   Ht 5\' 10"  (1.778 m)   Wt 230 lb 6.4 oz (104.5 kg)   SpO2 97%   BMI 33.06 kg/m    Wt Readings from Last 3 Encounters:  03/07/24 230 lb 6.4 oz (104.5 kg)  03/06/24 228 lb 3.2 oz (103.5 kg)  02/29/24 229 lb (103.9 kg)     GEN: Well nourished, well developed in no acute distress NECK: No JVD; No carotid bruits CARDIAC: RRR, no murmurs, rubs, gallops RESPIRATORY:  Clear to auscultation without rales, wheezing or rhonchi  ABDOMEN: Soft, non-tender, non-distended EXTREMITIES:  Mild leg edema; No deformity   ASSESSMENT AND PLAN:   PAF: He has very short paroxysms.  These are less than 6 minutes.  He and I have had a long discussion about this.  His thromboembolic risk is low.  If he has increased burden over time then I would really want to have a conversation with him about anticoagulation.  At this point no change in  therapy.  Of note he is tolerating the flecainide .  His intervals are normal.  He did have his EKG checked recently because he is on an antifungal.   HTN: The blood pressure is at target.  No change in therapy.    Preop: The patient is at acceptable risk for the planned surgery.  No change in therapy.  No further testing is indicated.  Is going for low risk procedure and has a reasonable functional level and no high risk features or findings.     Follow up with me in 1 year or sooner if needed.  Signed, Eilleen Grates, MD

## 2024-03-06 ENCOUNTER — Encounter: Payer: Self-pay | Admitting: Nurse Practitioner

## 2024-03-06 ENCOUNTER — Other Ambulatory Visit: Payer: Self-pay

## 2024-03-06 ENCOUNTER — Ambulatory Visit: Payer: Medicare Other | Admitting: Nurse Practitioner

## 2024-03-06 VITALS — BP 132/70 | HR 67 | Temp 96.8°F | Ht 70.0 in | Wt 228.2 lb

## 2024-03-06 DIAGNOSIS — I480 Paroxysmal atrial fibrillation: Secondary | ICD-10-CM

## 2024-03-06 DIAGNOSIS — G8929 Other chronic pain: Secondary | ICD-10-CM

## 2024-03-06 DIAGNOSIS — M25552 Pain in left hip: Secondary | ICD-10-CM

## 2024-03-06 DIAGNOSIS — I10 Essential (primary) hypertension: Secondary | ICD-10-CM

## 2024-03-06 DIAGNOSIS — F17211 Nicotine dependence, cigarettes, in remission: Secondary | ICD-10-CM | POA: Insufficient documentation

## 2024-03-06 DIAGNOSIS — R7303 Prediabetes: Secondary | ICD-10-CM

## 2024-03-06 DIAGNOSIS — M1711 Unilateral primary osteoarthritis, right knee: Secondary | ICD-10-CM | POA: Insufficient documentation

## 2024-03-06 DIAGNOSIS — R351 Nocturia: Secondary | ICD-10-CM

## 2024-03-06 MED ORDER — FLECAINIDE ACETATE 100 MG PO TABS: 100.0000 mg | ORAL_TABLET | Freq: Two times a day (BID) | ORAL | 0 refills | Status: DC

## 2024-03-06 NOTE — Progress Notes (Signed)
 Established Patient Office Visit  Subjective   Patient ID: INDIO SANTILLI, male    DOB: 12-Feb-1956  Age: 68 y.o. MRN: [id number]  Chief Complaint  Patient presents with   Hypertension    Follow up, prediabetes, concerns with left hip pain   HPI:  Discussed the use of AI scribe software for clinical note transcription with the patient, who gave verbal consent to proceed.  History of Present Illness   TATSUYA OKRAY II "Nadean August" is a 68 year old male who presents for routine follow-up and surgical clearance.  His hip responds well to cortisone injections every three months. He has stopped the lyrica  and is doing well. He has significant issues with his right knee due to bone-on-bone contact, and soft gel and cortisone injections have been ineffective. He is planning to have a right knee replacement in July, 3 months after the last injection. He is going to his cardiologist tomorrow and needs medical clearance today. He denies chest pain and shortness of breath, although he does have some shortness of breath with exertion. This has not changed.       ROS See pertinent positives and negatives per HPI.    Objective:     BP 132/70 (BP Location: Left Arm, Patient Position: Sitting, Cuff Size: Normal)   Pulse 67   Temp (!) 96.8 F (36 C)   Ht 5\' 10"  (1.778 m)   Wt 228 lb 3.2 oz (103.5 kg)   SpO2 96%   BMI 32.74 kg/m    Physical Exam Vitals and nursing note reviewed.  Constitutional:      Appearance: Normal appearance.  HENT:     Head: Normocephalic.  Eyes:     Conjunctiva/sclera: Conjunctivae normal.  Cardiovascular:     Rate and Rhythm: Normal rate and regular rhythm.     Pulses: Normal pulses.     Heart sounds: Normal heart sounds.  Pulmonary:     Effort: Pulmonary effort is normal.     Breath sounds: Normal breath sounds.  Musculoskeletal:     Cervical back: Normal range of motion.  Skin:    General: Skin is warm.  Neurological:     General: No focal  deficit present.     Mental Status: He is alert and oriented to person, place, and time.  Psychiatric:        Mood and Affect: Mood normal.        Behavior: Behavior normal.        Thought Content: Thought content normal.        Judgment: Judgment normal.    The 10-year ASCVD risk score (Arnett DK, et al., 2019) is: 13.2%    Assessment & Plan:   Problem List Items Addressed This Visit       Cardiovascular and Mediastinum   Paroxysmal atrial fibrillation (HCC)   Chronic, stable.  He is currently following with cardiology.  Will have him continue diltiazem  300 mg daily and flecainide  100 mg twice a day.  He was taking Coumadin , however did not like the way it made him feel.  He declines anticoagulation at this point.  Will have him continue to follow-up with cardiology routinely and reviewed their notes.       Essential hypertension - Primary   Chronic, stable. Continue hydralazine  25mg  TID, and ramipril  10mg  daily. Recent CMP, CBC, and lipid panel reviewed.       Relevant Orders   CBC with Differential/Platelet   Comprehensive metabolic panel with GFR  Lipid panel     Musculoskeletal and Integument   Primary osteoarthritis of right knee   Severe osteoarthritis with bone-on-bone contact. Non-responsive to soft gel injections. Awaiting surgical clearance for knee replacement post-July 15th. Complete surgical clearance form and fax to orthopedic surgeon. Keep appointment tomorrow with cardiology for cardiac clearance.         Other   Prediabetes   Chronic, stable. A1c 5.9%. Continue focusing on nutrition and exercise.      Relevant Orders   Comprehensive metabolic panel with GFR   Hemoglobin A1c   Chronic left hip pain   Chronic, stable. He weaned off lyrica  as he felt like it was not helping. He is still getting cortisol injections which have helped. Continue collaboration and recommendations from orthopedics.       Cigarette nicotine dependence in remission    Smoke-free for twelve years. Previous low-dose CT screening performed last year. Agreed to repeat screening. Order low-dose CT scan for lung cancer screening.       Relevant Orders   CT CHEST LUNG CA SCREEN LOW DOSE W/O CM   Other Visit Diagnoses       Nocturia       Check PSA next visit.   Relevant Orders   PSA       Return in about 6 months (around 09/06/2024) for routine follow-up.    Odette Benjamin, NP

## 2024-03-06 NOTE — Assessment & Plan Note (Signed)
 Chronic, stable. Continue hydralazine 25mg  TID, and ramipril 10mg  daily. Recent CMP, CBC, and lipid panel reviewed.

## 2024-03-06 NOTE — Assessment & Plan Note (Signed)
 Smoke-free for twelve years. Previous low-dose CT screening performed last year. Agreed to repeat screening. Order low-dose CT scan for lung cancer screening.

## 2024-03-06 NOTE — Assessment & Plan Note (Signed)
 Chronic, stable. A1c 5.9%. Continue focusing on nutrition and exercise.

## 2024-03-06 NOTE — Assessment & Plan Note (Signed)
 Severe osteoarthritis with bone-on-bone contact. Non-responsive to soft gel injections. Awaiting surgical clearance for knee replacement post-July 15th. Complete surgical clearance form and fax to orthopedic surgeon. Keep appointment tomorrow with cardiology for cardiac clearance.

## 2024-03-06 NOTE — Assessment & Plan Note (Signed)
 Chronic, stable. He weaned off lyrica  as he felt like it was not helping. He is still getting cortisol injections which have helped. Continue collaboration and recommendations from orthopedics.

## 2024-03-06 NOTE — Assessment & Plan Note (Signed)
 Chronic, stable.  He is currently following with cardiology.  Will have him continue diltiazem  300 mg daily and flecainide  100 mg twice a day.  He was taking Coumadin , however did not like the way it made him feel.  He declines anticoagulation at this point.  Will have him continue to follow-up with cardiology routinely and reviewed their notes.

## 2024-03-06 NOTE — Addendum Note (Signed)
 Addended by: Shaylin Blatt A on: 03/06/2024 08:59 AM   Modules accepted: Level of Service

## 2024-03-06 NOTE — Patient Instructions (Addendum)
 It was great to see you!  Keep up the great work!   I have ordered a CT to screen for lung cancer - they will call to schedule.   Let's follow-up in 6 months, sooner if you have concerns.  If a referral was placed today, you will be contacted for an appointment. Please note that routine referrals can sometimes take up to 3-4 weeks to process. Please call our office if you haven't heard anything after this time frame.  Take care,  Rheba Cedar, NP

## 2024-03-07 ENCOUNTER — Ambulatory Visit: Payer: Medicare Other | Attending: Cardiology | Admitting: Cardiology

## 2024-03-07 ENCOUNTER — Encounter: Payer: Self-pay | Admitting: Cardiology

## 2024-03-07 VITALS — BP 134/76 | HR 73 | Ht 70.0 in | Wt 230.4 lb

## 2024-03-07 DIAGNOSIS — I480 Paroxysmal atrial fibrillation: Secondary | ICD-10-CM | POA: Diagnosis present

## 2024-03-07 DIAGNOSIS — I10 Essential (primary) hypertension: Secondary | ICD-10-CM | POA: Diagnosis present

## 2024-03-07 NOTE — Patient Instructions (Signed)
 Follow-Up: At [facility], you and your health needs are our priority.  As part of our continuing mission to provide you with exceptional heart care, our providers are all part of one team.  This team includes your primary Cardiologist (physician) and Advanced Practice Providers or APPs (Physician Assistants and Nurse Practitioners) who all work together to provide you with the care you need, when you need it.  Your next appointment:   1 year(s)  Provider:   Eilleen Grates, MD

## 2024-03-09 ENCOUNTER — Other Ambulatory Visit

## 2024-04-11 ENCOUNTER — Ambulatory Visit: Payer: Self-pay | Admitting: Emergency Medicine

## 2024-04-11 DIAGNOSIS — G8929 Other chronic pain: Secondary | ICD-10-CM

## 2024-04-11 NOTE — H&P (View-Only) (Signed)
 TOTAL KNEE ADMISSION H&P  Patient is being admitted for right total knee arthroplasty.  Subjective:  Chief Complaint:right knee pain.  HPI: Scott Townsend II, 68 y.o. male, has a history of pain and functional disability in the right knee due to arthritis and has failed non-surgical conservative treatments for greater than 12 weeks to includeNSAID's and/or analgesics, corticosteriod injections, use of assistive devices, and activity modification.  Onset of symptoms was gradual, starting 2 years ago with gradually worsening course since that time. The patient noted no past surgery on the right knee(s).  Patient currently rates pain in the right knee(s) at 10 out of 10 with activity. Patient has night pain, worsening of pain with activity and weight bearing, pain that interferes with activities of daily living, and pain with passive range of motion.  Patient has evidence of periarticular osteophytes and joint space narrowing by imaging studies.  There is no active infection.  Patient Active Problem List   Diagnosis Date Noted   Primary osteoarthritis of right knee 03/06/2024   Cigarette nicotine dependence in remission 03/06/2024   Chronic left hip pain 08/09/2023   LLQ pain 07/26/2023   Prediabetes 07/26/2023   Leg edema, left 05/07/2023   Chronic low back pain 12/04/2022   History of tobacco use 12/04/2022   Atrial fibrillation (HCC) 03/08/2020   Paroxysmal atrial fibrillation (HCC) 01/25/2020   Essential hypertension 01/25/2020   Educated about COVID-19 virus infection 01/25/2020   Atrial fibrillation with RVR (HCC) 02/16/2012   Past Medical History:  Diagnosis Date   Arthritis    Atrial fibrillation (HCC)    Barrett esophagus    GERD (gastroesophageal reflux disease)    Hypertension     Past Surgical History:  Procedure Laterality Date   CARDIOVERSION      Current Outpatient Medications  Medication Sig Dispense Refill Last Dose/Taking   diltiazem  (CARDIZEM  CD) 300 MG 24 hr  capsule Take 1 capsule (300 mg total) by mouth daily. 90 capsule 0    diphenhydramine-acetaminophen  (TYLENOL  PM) 25-500 MG TABS Take 1 tablet by mouth daily.      flecainide  (TAMBOCOR ) 100 MG tablet Take 1 tablet (100 mg total) by mouth 2 (two) times daily. 180 tablet 0    hydrALAZINE  (APRESOLINE ) 25 MG tablet Take 1 tablet (25 mg total) by mouth 3 (three) times daily. 270 tablet 1    lansoprazole (PREVACID) 30 MG capsule Take 30 mg by mouth daily.      ramipril  (ALTACE ) 10 MG capsule Take 1 capsule (10 mg total) by mouth 2 (two) times daily. 180 capsule 3    terbinafine  (LAMISIL ) 250 MG tablet Take 1 tablet (250 mg total) by mouth daily. 90 tablet 0    No current facility-administered medications for this visit.   Allergies  Allergen Reactions   Percocet [Oxycodone -Acetaminophen ] Nausea And Vomiting   Vicodin [Hydrocodone-Acetaminophen ] Nausea Only    Social History   Tobacco Use   Smoking status: Former    Current packs/day: 0.00    Average packs/day: 1 pack/day for 25.0 years (25.0 ttl pk-yrs)    Types: Cigarettes    Start date: 07/21/1987    Quit date: 07/20/2012    Years since quitting: 11.7   Smokeless tobacco: Never   Tobacco comments:    Off and on   Substance Use Topics   Alcohol use: Yes    Alcohol/week: 3.0 standard drinks of alcohol    Types: 3 Shots of liquor per week    Comment: occ  Family History  Problem Relation Age of Onset   Deep vein thrombosis Mother    Varicose Veins Mother    Alcohol abuse Father    Heart attack Father 26   Stroke Father 74     Review of Systems  Musculoskeletal:  Positive for arthralgias.  All other systems reviewed and are negative.   Objective:  Physical Exam Constitutional:      General: He is not in acute distress.    Appearance: Normal appearance. He is normal weight.  HENT:     Head: Normocephalic and atraumatic.   Eyes:     Extraocular Movements: Extraocular movements intact.     Conjunctiva/sclera:  Conjunctivae normal.     Pupils: Pupils are equal, round, and reactive to light.    Cardiovascular:     Rate and Rhythm: Normal rate and regular rhythm.     Pulses: Normal pulses.     Heart sounds: Normal heart sounds.  Pulmonary:     Effort: Pulmonary effort is normal. No respiratory distress.     Breath sounds: Normal breath sounds.  Abdominal:     General: Bowel sounds are normal. There is no distension.     Palpations: Abdomen is soft.     Tenderness: There is no abdominal tenderness.   Musculoskeletal:        General: Tenderness present.     Cervical back: Normal range of motion and neck supple.     Comments: TTP over medial and lateral joint line.  No calf tenderness, swelling, or erythema.  No overlying lesions of area of chief complaint.  Decreased strength and ROM due to elicited pain.  Pre-operative ROM 0-120.  Dorsiflexion and plantarflexion intact.  Stable to varus and valgus stress.  BLE appear grossly neurovascularly intact.  Gait mildly antalgic.   Lymphadenopathy:     Cervical: No cervical adenopathy.   Skin:    General: Skin is warm and dry.     Capillary Refill: Capillary refill takes less than 2 seconds.     Findings: No erythema or rash.   Neurological:     General: No focal deficit present.     Mental Status: He is alert and oriented to person, place, and time.   Psychiatric:        Mood and Affect: Mood normal.        Behavior: Behavior normal.     Vital signs in last 24 hours: @VSRANGES @  Labs:   Estimated body mass index is 33.06 kg/m as calculated from the following:   Height as of 03/07/24: 5' 10 (1.778 m).   Weight as of 03/07/24: 104.5 kg.   Imaging Review Plain radiographs demonstrate severe degenerative joint disease of the right knee(s). The overall alignment issignificant varus. The bone quality appears to be fair for age and reported activity level.      Assessment/Plan:  End stage arthritis, right knee   The patient  history, physical examination, clinical judgment of the provider and imaging studies are consistent with end stage degenerative joint disease of the right knee(s) and total knee arthroplasty is deemed medically necessary. The treatment options including medical management, injection therapy arthroscopy and arthroplasty were discussed at length. The risks and benefits of total knee arthroplasty were presented and reviewed. The risks due to aseptic loosening, infection, stiffness, patella tracking problems, thromboembolic complications and other imponderables were discussed. The patient acknowledged the explanation, agreed to proceed with the plan and consent was signed. Patient is being admitted for inpatient treatment for  surgery, pain control, PT, OT, prophylactic antibiotics, VTE prophylaxis, progressive ambulation and ADL's and discharge planning. The patient is planning to be discharged home with outpatient PT.     Patient's anticipated LOS is less than 2 midnights, meeting these requirements: - Lives within 1 hour of care - Has a competent adult at home to recover with post-op recover - NO history of  - Chronic pain requiring opiods  - Diabetes  - Coronary Artery Disease  - Heart failure  - Heart attack  - Stroke  - DVT/VTE  - Respiratory Failure/COPD  - Renal failure  - Anemia  - Advanced Liver disease

## 2024-04-11 NOTE — H&P (Signed)
 TOTAL KNEE ADMISSION H&P  Patient is being admitted for right total knee arthroplasty.  Subjective:  Chief Complaint:right knee pain.  HPI: Scott Townsend, 68 y.o. male, has a history of pain and functional disability in the right knee due to arthritis and has failed non-surgical conservative treatments for greater than 12 weeks to includeNSAID's and/or analgesics, corticosteriod injections, use of assistive devices, and activity modification.  Onset of symptoms was gradual, starting 2 years ago with gradually worsening course since that time. The patient noted no past surgery on the right knee(s).  Patient currently rates pain in the right knee(s) at 10 out of 10 with activity. Patient has night pain, worsening of pain with activity and weight bearing, pain that interferes with activities of daily living, and pain with passive range of motion.  Patient has evidence of periarticular osteophytes and joint space narrowing by imaging studies.  There is no active infection.  Patient Active Problem List   Diagnosis Date Noted   Primary osteoarthritis of right knee 03/06/2024   Cigarette nicotine dependence in remission 03/06/2024   Chronic left hip pain 08/09/2023   LLQ pain 07/26/2023   Prediabetes 07/26/2023   Leg edema, left 05/07/2023   Chronic low back pain 12/04/2022   History of tobacco use 12/04/2022   Atrial fibrillation (HCC) 03/08/2020   Paroxysmal atrial fibrillation (HCC) 01/25/2020   Essential hypertension 01/25/2020   Educated about COVID-19 virus infection 01/25/2020   Atrial fibrillation with RVR (HCC) 02/16/2012   Past Medical History:  Diagnosis Date   Arthritis    Atrial fibrillation (HCC)    Barrett esophagus    GERD (gastroesophageal reflux disease)    Hypertension     Past Surgical History:  Procedure Laterality Date   CARDIOVERSION      Current Outpatient Medications  Medication Sig Dispense Refill Last Dose/Taking   diltiazem  (CARDIZEM  CD) 300 MG 24 hr  capsule Take 1 capsule (300 mg total) by mouth daily. 90 capsule 0    diphenhydramine-acetaminophen  (TYLENOL  PM) 25-500 MG TABS Take 1 tablet by mouth daily.      flecainide  (TAMBOCOR ) 100 MG tablet Take 1 tablet (100 mg total) by mouth 2 (two) times daily. 180 tablet 0    hydrALAZINE  (APRESOLINE ) 25 MG tablet Take 1 tablet (25 mg total) by mouth 3 (three) times daily. 270 tablet 1    lansoprazole (PREVACID) 30 MG capsule Take 30 mg by mouth daily.      ramipril  (ALTACE ) 10 MG capsule Take 1 capsule (10 mg total) by mouth 2 (two) times daily. 180 capsule 3    terbinafine  (LAMISIL ) 250 MG tablet Take 1 tablet (250 mg total) by mouth daily. 90 tablet 0    No current facility-administered medications for this visit.   Allergies  Allergen Reactions   Percocet [Oxycodone -Acetaminophen ] Nausea And Vomiting   Vicodin [Hydrocodone-Acetaminophen ] Nausea Only    Social History   Tobacco Use   Smoking status: Former    Current packs/day: 0.00    Average packs/day: 1 pack/day for 25.0 years (25.0 ttl pk-yrs)    Types: Cigarettes    Start date: 07/21/1987    Quit date: 07/20/2012    Years since quitting: 11.7   Smokeless tobacco: Never   Tobacco comments:    Off and on   Substance Use Topics   Alcohol use: Yes    Alcohol/week: 3.0 standard drinks of alcohol    Types: 3 Shots of liquor per week    Comment: occ  Family History  Problem Relation Age of Onset   Deep vein thrombosis Mother    Varicose Veins Mother    Alcohol abuse Father    Heart attack Father 26   Stroke Father 74     Review of Systems  Musculoskeletal:  Positive for arthralgias.  All other systems reviewed and are negative.   Objective:  Physical Exam Constitutional:      General: He is not in acute distress.    Appearance: Normal appearance. He is normal weight.  HENT:     Head: Normocephalic and atraumatic.   Eyes:     Extraocular Movements: Extraocular movements intact.     Conjunctiva/sclera:  Conjunctivae normal.     Pupils: Pupils are equal, round, and reactive to light.    Cardiovascular:     Rate and Rhythm: Normal rate and regular rhythm.     Pulses: Normal pulses.     Heart sounds: Normal heart sounds.  Pulmonary:     Effort: Pulmonary effort is normal. No respiratory distress.     Breath sounds: Normal breath sounds.  Abdominal:     General: Bowel sounds are normal. There is no distension.     Palpations: Abdomen is soft.     Tenderness: There is no abdominal tenderness.   Musculoskeletal:        General: Tenderness present.     Cervical back: Normal range of motion and neck supple.     Comments: TTP over medial and lateral joint line.  No calf tenderness, swelling, or erythema.  No overlying lesions of area of chief complaint.  Decreased strength and ROM due to elicited pain.  Pre-operative ROM 0-120.  Dorsiflexion and plantarflexion intact.  Stable to varus and valgus stress.  BLE appear grossly neurovascularly intact.  Gait mildly antalgic.   Lymphadenopathy:     Cervical: No cervical adenopathy.   Skin:    General: Skin is warm and dry.     Capillary Refill: Capillary refill takes less than 2 seconds.     Findings: No erythema or rash.   Neurological:     General: No focal deficit present.     Mental Status: He is alert and oriented to person, place, and time.   Psychiatric:        Mood and Affect: Mood normal.        Behavior: Behavior normal.     Vital signs in last 24 hours: @VSRANGES @  Labs:   Estimated body mass index is 33.06 kg/m as calculated from the following:   Height as of 03/07/24: 5' 10 (1.778 m).   Weight as of 03/07/24: 104.5 kg.   Imaging Review Plain radiographs demonstrate severe degenerative joint disease of the right knee(s). The overall alignment issignificant varus. The bone quality appears to be fair for age and reported activity level.      Assessment/Plan:  End stage arthritis, right knee   The patient  history, physical examination, clinical judgment of the provider and imaging studies are consistent with end stage degenerative joint disease of the right knee(s) and total knee arthroplasty is deemed medically necessary. The treatment options including medical management, injection therapy arthroscopy and arthroplasty were discussed at length. The risks and benefits of total knee arthroplasty were presented and reviewed. The risks due to aseptic loosening, infection, stiffness, patella tracking problems, thromboembolic complications and other imponderables were discussed. The patient acknowledged the explanation, agreed to proceed with the plan and consent was signed. Patient is being admitted for inpatient treatment for  surgery, pain control, PT, OT, prophylactic antibiotics, VTE prophylaxis, progressive ambulation and ADL's and discharge planning. The patient is planning to be discharged home with outpatient PT.     Patient's anticipated LOS is less than 2 midnights, meeting these requirements: - Lives within 1 hour of care - Has a competent adult at home to recover with post-op recover - NO history of  - Chronic pain requiring opiods  - Diabetes  - Coronary Artery Disease  - Heart failure  - Heart attack  - Stroke  - DVT/VTE  - Respiratory Failure/COPD  - Renal failure  - Anemia  - Advanced Liver disease

## 2024-04-12 ENCOUNTER — Encounter: Payer: Self-pay | Admitting: Nurse Practitioner

## 2024-04-19 NOTE — Patient Instructions (Addendum)
 SURGICAL WAITING ROOM VISITATION Patients having surgery or a procedure may have no more than 2 support people in the waiting area - these visitors may rotate in the visitor waiting room.   If the patient needs to stay at the hospital during part of their recovery, the visitor guidelines for inpatient rooms apply.  PRE-OP VISITATION  Pre-op nurse will coordinate an appropriate time for 1 support person to accompany the patient in pre-op.  This support person may not rotate.  This visitor will be contacted when the time is appropriate for the visitor to come back in the pre-op area.  Please refer to the [facility] website for the visitor guidelines for Inpatients (after your surgery is over and you are in a regular room).  You are not required to quarantine at this time prior to your surgery. However, you must do this: Hand Hygiene often Do NOT share personal items Notify your provider if you are in close contact with someone who has COVID or you develop fever 100.4 or greater, new onset of sneezing, cough, sore throat, shortness of breath or body aches.  If you test positive for Covid or have been in contact with anyone that has tested positive in the last 10 days please notify you surgeon.    Your procedure is scheduled on:  Wednesday  May 03, 2024  Report to [facility] Main Entrance: [place] entrance where the [employer] is available.   Report to admitting at: 06:00 AM  Call this number if you have any questions or problems the morning of surgery [phone]  Do not eat food after Midnight the night prior to your surgery/procedure.  After Midnight you may have the following liquids until 05:30 AM DAY OF SURGERY  Clear Liquid Diet Water Black Coffee (sugar ok, NO MILK/CREAM OR CREAMERS)  Tea (sugar ok, NO MILK/CREAM OR CREAMERS) regular and decaf                             Plain Jell-O  with no fruit (NO RED)                                           Fruit ices  (not with fruit pulp, NO RED)                                     Popsicles (NO RED)                                                                  Juice: NO CITRUS JUICES: only apple, WHITE grape, WHITE cranberry Sports drinks like Gatorade or Powerade (NO RED)                   The day of surgery:  Drink ONE (1) Pre-Surgery G2 at   05:30  AM the morning of surgery. Drink in one sitting. Do not sip.  This drink was given to you during your hospital pre-op appointment visit. Nothing else to drink after completing the Pre-Surgery G2 :  No candy, chewing gum or throat lozenges.    FOLLOW ANY ADDITIONAL PRE OP INSTRUCTIONS YOU RECEIVED FROM YOUR SURGEON'S OFFICE!!!   Oral Hygiene is also important to reduce your risk of infection.        Remember - BRUSH YOUR TEETH THE MORNING OF SURGERY WITH YOUR REGULAR TOOTHPASTE  Do NOT smoke after Midnight the night before surgery.  STOP TAKING all Vitamins, Herbs and supplements 1 week before your surgery.   Take ONLY these medicines the morning of surgery with A SIP OF WATER: Lansoprazole, Flecainide , Diltiazem , Hydralazine .  DO NOT TAKE RAMIPRIL  on the morning of your surgery.                    You may not have any metal on your body including  jewelry, and body piercing  Do not wear  lotions, powders, cologne, or deodorant  Men may shave face and neck.  Contacts, Hearing Aids, dentures or bridgework may not be worn into surgery. DENTURES WILL BE REMOVED PRIOR TO SURGERY PLEASE DO NOT APPLY Poly grip OR ADHESIVES!!!   Patients discharged on the day of surgery will not be allowed to drive home.  Someone NEEDS to stay with you for the first 24 hours after anesthesia.  Do not bring your home medications to the hospital. The Pharmacy will dispense medications listed on your medication list to you during your admission in the Hospital.  Special Instructions: Bring a copy of your healthcare power of attorney and living will documents the  day of surgery, if you wish to have them scanned into your [place] [facility] Records- EPIC  Please read over the following fact sheets you were given: IF YOU HAVE QUESTIONS ABOUT YOUR PRE-OP INSTRUCTIONS, PLEASE CALL [phone].     Pre-operative 5 CHG Bath Instructions   You can play a key role in reducing the risk of infection after surgery. Your skin needs to be as free of germs as possible. You can reduce the number of germs on your skin by washing with CHG (chlorhexidine gluconate) soap before surgery. CHG is an antiseptic soap that kills germs and continues to kill germs even after washing.   DO NOT use if you have an allergy to chlorhexidine/CHG or antibacterial soaps. If your skin becomes reddened or irritated, stop using the CHG and notify one of our RNs at [phone]  Please shower with the CHG soap starting 4 days before surgery using the following schedule: START SHOWERS ON   SATURDAY  April 29, 2024  Please keep in mind the following:  DO NOT shave, including legs and underarms, starting the day of your first shower.   You may shave your face at any point before/day of surgery.   Place clean sheets on your bed the day you start using CHG soap. Use a clean washcloth (not used since being washed) for each shower. DO NOT sleep with pets once you start using the CHG.   CHG Shower Instructions:  If you choose to wash your hair and private area, wash first with your normal shampoo/soap.  After you use shampoo/soap, rinse your hair and body thoroughly to remove shampoo/soap residue.  Turn the water OFF and apply about 3 tablespoons (45 ml) of CHG soap to a CLEAN washcloth.  Apply CHG soap ONLY FROM YOUR NECK DOWN TO YOUR TOES (washing for 3-5 minutes)  DO NOT use CHG soap on face, private areas, open wounds,  or sores.  Pay special attention to the area where your surgery is being performed.  If you are having back surgery, having someone wash your back for you may be helpful.  Wait 2 minutes after CHG soap is applied, then you may rinse off the CHG soap.  Pat dry with a clean towel  Put on clean clothes/pajamas   If you choose to wear lotion, please use ONLY the CHG-compatible lotions on the back of this paper.     Additional instructions for the day of surgery: DO NOT APPLY any lotions, deodorants, cologne, or perfumes.   Put on clean/comfortable clothes.  Brush your teeth.  Ask your nurse before applying any prescription medications to the skin.      CHG Compatible Lotions   Aveeno Moisturizing lotion  Cetaphil Moisturizing Cream  Cetaphil Moisturizing Lotion  Clairol Herbal Essence Moisturizing Lotion, Dry Skin  Clairol Herbal Essence Moisturizing Lotion, Extra Dry Skin  Clairol Herbal Essence Moisturizing Lotion, Normal Skin  Curel Age Defying Therapeutic Moisturizing Lotion with Alpha Hydroxy  Curel Extreme Care Body Lotion  Curel Soothing Hands Moisturizing Hand Lotion  Curel Therapeutic Moisturizing Cream, Fragrance-Free  Curel Therapeutic Moisturizing Lotion, Fragrance-Free  Curel Therapeutic Moisturizing Lotion, Original Formula  Eucerin Daily Replenishing Lotion  Eucerin Dry Skin Therapy Plus Alpha Hydroxy Crme  Eucerin Dry Skin Therapy Plus Alpha Hydroxy Lotion  Eucerin Original Crme  Eucerin Original Lotion  Eucerin Plus Crme Eucerin Plus Lotion  Eucerin TriLipid Replenishing Lotion  Keri Anti-Bacterial Hand Lotion  Keri Deep Conditioning Original Lotion Dry Skin Formula Softly Scented  Keri Deep Conditioning Original Lotion, Fragrance Free Sensitive Skin Formula  Keri Lotion Fast Absorbing Fragrance Free Sensitive Skin Formula  Keri Lotion Fast Absorbing Softly Scented Dry Skin Formula  Keri Original Lotion  Keri Skin Renewal Lotion Keri Silky Smooth Lotion   Keri Silky Smooth Sensitive Skin Lotion  Nivea Body Creamy Conditioning Oil  Nivea Body Extra Enriched Lotion  Nivea Body Original Lotion  Nivea Body Sheer Moisturizing Lotion Nivea Crme  Nivea Skin Firming Lotion  NutraDerm 30 Skin Lotion  NutraDerm Skin Lotion  NutraDerm Therapeutic Skin Cream  NutraDerm Therapeutic Skin Lotion  ProShield Protective Hand Cream  Provon moisturizing lotion   FAILURE TO FOLLOW THESE INSTRUCTIONS MAY RESULT IN THE CANCELLATION OF YOUR SURGERY  PATIENT SIGNATURE_________________________________  NURSE SIGNATURE__________________________________  ________________________________________________________________________       Scott Townsend    An incentive spirometer is a tool that can help keep your lungs clear and active. This tool measures how well you are filling your lungs with each breath. Taking  long deep breaths may help reverse or decrease the chance of developing breathing (pulmonary) problems (especially infection) following: A long period of time when you are unable to move or be active. BEFORE THE PROCEDURE  If the spirometer includes an indicator to show your best effort, your nurse or respiratory therapist will set it to a desired goal. If possible, sit up straight or lean slightly forward. Try not to slouch. Hold the incentive spirometer in an upright position. INSTRUCTIONS FOR USE  Sit on the edge of your bed if possible, or sit up as far as you can in bed or on a chair. Hold the incentive spirometer in an upright position. Breathe out normally. Place the mouthpiece in your mouth and seal your lips tightly around it. Breathe in slowly and as deeply as possible, raising the piston or the ball toward the top of the column. Hold your breath for 3-5 seconds or for as long as possible. Allow the piston or ball to fall to the bottom of the column. Remove the mouthpiece from your mouth and breathe out normally. Rest for a few  seconds and repeat Steps 1 through 7 at least 10 times every 1-2 hours when you are awake. Take your time and take a few normal breaths between deep breaths. The spirometer may include an indicator to show your best effort. Use the indicator as a goal to work toward during each repetition. After each set of 10 deep breaths, practice coughing to be sure your lungs are clear. If you have an incision (the cut made at the time of surgery), support your incision when coughing by placing a pillow or rolled up towels firmly against it. Once you are able to get out of bed, walk around indoors and cough well. You may stop using the incentive spirometer when instructed by your caregiver.  RISKS AND COMPLICATIONS Take your time so you do not get dizzy or light-headed. If you are in pain, you may need to take or ask for pain medication before doing incentive spirometry. It is harder to take a deep breath if you are having pain. AFTER USE Rest and breathe slowly and easily. It can be helpful to keep track of a log of your progress. Your caregiver can provide you with a simple table to help with this. If you are using the spirometer at home, follow these instructions: SEEK MEDICAL CARE IF:  You are having difficultly using the spirometer. You have trouble using the spirometer as often as instructed. Your pain medication is not giving enough relief while using the spirometer. You develop fever of 100.5 F (38.1 C) or higher.                                                                                                    SEEK IMMEDIATE MEDICAL CARE IF:  You cough up bloody sputum that had not been present before. You develop fever of 102 F (38.9 C) or greater. You develop worsening pain at or near the incision site. MAKE SURE YOU:  Understand these instructions. Will watch your condition. Will  get help right away if you are not doing well or get worse. Document Released: 02/15/2007 Document Revised:  12/28/2011 Document Reviewed: 04/18/2007 [facility] Patient Information 2014 [place], [place].        WHAT IS A BLOOD TRANSFUSION? Blood Transfusion Information  A transfusion is the replacement of blood or some of its parts. Blood is made up of multiple cells which provide different functions. Red blood cells carry oxygen and are used for blood loss replacement. White blood cells fight against infection. Platelets control bleeding. Plasma helps clot blood. Other blood products are available for specialized needs, such as hemophilia or other clotting disorders. BEFORE THE TRANSFUSION  Who gives blood for transfusions?  Healthy volunteers who are fully evaluated to make sure their blood is safe. This is blood bank blood. Transfusion therapy is the safest it has ever been in the practice of medicine. Before blood is taken from a donor, a complete history is taken to make sure that person has no history of diseases nor engages in risky social behavior (examples are intravenous drug use or sexual activity with multiple partners). The donor's travel history is screened to minimize risk of transmitting infections, such as malaria. The donated blood is tested for signs of infectious diseases, such as HIV and hepatitis. The blood is then tested to be sure it is compatible with you in order to minimize the chance of a transfusion reaction. If you or a relative donates blood, this is often done in anticipation of surgery and is not appropriate for emergency situations. It takes many days to process the donated blood. RISKS AND COMPLICATIONS Although transfusion therapy is very safe and saves many lives, the main dangers of transfusion include:  Getting an infectious disease. Developing a transfusion reaction. This is an allergic reaction to something in the blood you were given. Every precaution is taken to prevent this. The decision to have a blood transfusion has been considered carefully by your  caregiver before blood is given. Blood is not given unless the benefits outweigh the risks. AFTER THE TRANSFUSION Right after receiving a blood transfusion, you will usually feel much better and more energetic. This is especially true if your red blood cells have gotten low (anemic). The transfusion raises the level of the red blood cells which carry oxygen, and this usually causes an energy increase. The nurse administering the transfusion will monitor you carefully for complications. HOME CARE INSTRUCTIONS  No special instructions are needed after a transfusion. You may find your energy is better. Speak with your caregiver about any limitations on activity for underlying diseases you may have. SEEK MEDICAL CARE IF:  Your condition is not improving after your transfusion. You develop redness or irritation at the intravenous (IV) site. SEEK IMMEDIATE MEDICAL CARE IF:  Any of the following symptoms occur over the next 12 hours: Shaking chills. You have a temperature by mouth above 102 F (38.9 C), not controlled by medicine. Chest, back, or muscle pain. People around you feel you are not acting correctly or are confused. Shortness of breath or difficulty breathing. Dizziness and fainting. You get a rash or develop hives. You have a decrease in urine output. Your urine turns a dark color or changes to pink, red, or brown. Any of the following symptoms occur over the next 10 days: You have a temperature by mouth above 102 F (38.9 C), not controlled by medicine. Shortness of breath. Weakness after normal activity. The white part of the eye turns yellow (jaundice). You have  a decrease in the amount of urine or are urinating less often. Your urine turns a dark color or changes to pink, red, or brown. Document Released: 10/02/2000 Document Revised: 12/28/2011 Document Reviewed: 05/21/2008 [facility] Patient Information 2014 [place],  [place].  _______________________________________________________________________       If you would like to see a video about joint replacement:   [email or web address]

## 2024-04-19 NOTE — Progress Notes (Signed)
 COVID Vaccine received:  []  No [x]  Yes Date of any COVID positive Test in last 90 days:  PCP - Tinnie Harada, NP medical clearance in 03-06-24 Epic note Cardiologist - Lynwood Schilling, MD cardiac clearance in 03-07-24 Epic note  Chest x-ray - 12-22-2019  2v  Epic EKG - 03-07-2024  Epic  Stress Test -  ECHO -  Cardiac Cath -  CT Coronary Calcium score:   Pacemaker / ICD device [x]  No []  Yes   Spinal Cord Stimulator:[x]  No []  Yes       History of Sleep Apnea? [x]  No []  Yes   CPAP used?- [x]  No []  Yes    Does the patient monitor blood sugar?   []  N/A   []  No []  Yes  Patient has: []  NO Hx DM   [x]  Pre-DM   []  DM1  []   DM2 Last A1c was: 5.9 on  02-28-2024    No Meds     Blood Thinner / Instructions: None   Patient declines to take d/t cost and limited transportation to get Lab work.   Aspirin  Instructions:   None  ERAS Protocol Ordered: []  No  [x]  Yes PRE-SURGERY []  ENSURE  [x]  G2    Patient is to be NPO after: 0530  Dental hx: []  Dentures:  []  N/A      []  Bridge or Partial:                   []  Loose or Damaged teeth:   Comments: Patient was given the 5 CHG shower / bath instructions for TKA surgery along with 2 bottles of the CHG soap. Patient will start this on:  04-29-24            Activity level: Able to walk up 2 flights of stairs without becoming significantly short of breath or having chest pain?  []  No   []    Yes   Anesthesia review: A.fib (multiple cardioversions), Pre-DM, HTN GERD- Barrett's esophagus.   Patient denies any S&S of respiratory illness or Covid - no shortness of breath, fever, cough or chest pain at PAT appointment.  Patient verbalized understanding and agreement to the Pre-Surgical Instructions that were given to them at this PAT appointment. Patient was also educated of the need to review these PAT instructions again prior to his surgery.I reviewed the appropriate phone numbers to call if they have any and questions or concerns.

## 2024-04-20 ENCOUNTER — Other Ambulatory Visit: Payer: Self-pay

## 2024-04-20 ENCOUNTER — Encounter (HOSPITAL_COMMUNITY): Payer: Self-pay

## 2024-04-20 ENCOUNTER — Encounter (HOSPITAL_COMMUNITY)
Admission: RE | Admit: 2024-04-20 | Discharge: 2024-04-20 | Disposition: A | Discharge status: Home / Self Care | Source: Ambulatory Visit | Attending: Orthopedic Surgery | Admitting: Orthopedic Surgery

## 2024-04-20 VITALS — BP 132/58 | HR 82 | Temp 98.1°F | Resp 16 | Ht 70.0 in | Wt 225.0 lb

## 2024-04-20 DIAGNOSIS — M1711 Unilateral primary osteoarthritis, right knee: Secondary | ICD-10-CM | POA: Diagnosis not present

## 2024-04-20 DIAGNOSIS — I10 Essential (primary) hypertension: Secondary | ICD-10-CM | POA: Diagnosis not present

## 2024-04-20 DIAGNOSIS — G8929 Other chronic pain: Secondary | ICD-10-CM | POA: Insufficient documentation

## 2024-04-20 DIAGNOSIS — Z87891 Personal history of nicotine dependence: Secondary | ICD-10-CM | POA: Diagnosis not present

## 2024-04-20 DIAGNOSIS — I4891 Unspecified atrial fibrillation: Secondary | ICD-10-CM | POA: Diagnosis not present

## 2024-04-20 DIAGNOSIS — Z01818 Encounter for other preprocedural examination: Secondary | ICD-10-CM

## 2024-04-20 DIAGNOSIS — K219 Gastro-esophageal reflux disease without esophagitis: Secondary | ICD-10-CM | POA: Insufficient documentation

## 2024-04-20 DIAGNOSIS — Z01812 Encounter for preprocedural laboratory examination: Secondary | ICD-10-CM | POA: Insufficient documentation

## 2024-04-20 DIAGNOSIS — R7303 Prediabetes: Secondary | ICD-10-CM | POA: Diagnosis not present

## 2024-04-20 HISTORY — DX: Prediabetes: R73.03

## 2024-04-20 HISTORY — DX: Cardiac arrhythmia, unspecified: I49.9

## 2024-04-20 LAB — CBC WITH DIFFERENTIAL/PLATELET
Abs Immature Granulocytes: 0.04 10*3/uL (ref 0.00–0.07)
Basophils Absolute: 0 10*3/uL (ref 0.0–0.1)
Basophils Relative: 0 %
Eosinophils Absolute: 0.1 10*3/uL (ref 0.0–0.5)
Eosinophils Relative: 1 %
HCT: 41.8 % (ref 39.0–52.0)
Hemoglobin: 14 g/dL (ref 13.0–17.0)
Immature Granulocytes: 0 %
Lymphocytes Relative: 15 %
Lymphs Abs: 1.3 10*3/uL (ref 0.7–4.0)
MCH: 31.7 pg (ref 26.0–34.0)
MCHC: 33.5 g/dL (ref 30.0–36.0)
MCV: 94.8 fL (ref 80.0–100.0)
Monocytes Absolute: 1.1 10*3/uL — ABNORMAL HIGH (ref 0.1–1.0)
Monocytes Relative: 12 %
Neutro Abs: 6.4 10*3/uL (ref 1.7–7.7)
Neutrophils Relative %: 72 %
Platelets: 215 10*3/uL (ref 150–400)
RBC: 4.41 MIL/uL (ref 4.22–5.81)
RDW: 13.5 % (ref 11.5–15.5)
WBC: 9 10*3/uL (ref 4.0–10.5)
nRBC: 0 % (ref 0.0–0.2)

## 2024-04-20 LAB — TYPE AND SCREEN
ABO/RH(D): O POS
Antibody Screen: NEGATIVE

## 2024-04-20 LAB — COMPREHENSIVE METABOLIC PANEL WITH GFR
ALT: 20 U/L (ref 0–44)
AST: 17 U/L (ref 15–41)
Albumin: 4.2 g/dL (ref 3.5–5.0)
Alkaline Phosphatase: 60 U/L (ref 38–126)
Anion gap: 11 (ref 5–15)
BUN: 11 mg/dL (ref 8–23)
CO2: 23 mmol/L (ref 22–32)
Calcium: 9.1 mg/dL (ref 8.9–10.3)
Chloride: 104 mmol/L (ref 98–111)
Creatinine, Ser: 1.02 mg/dL (ref 0.61–1.24)
GFR, Estimated: >60 mL/min (ref >60)
Glucose, Bld: 117 mg/dL — ABNORMAL HIGH (ref 70–99)
Potassium: 3.6 mmol/L (ref 3.5–5.1)
Sodium: 138 mmol/L (ref 135–145)
Total Bilirubin: 0.5 mg/dL (ref 0.0–1.2)
Total Protein: 7.3 g/dL (ref 6.5–8.1)

## 2024-04-20 LAB — SURGICAL PCR SCREEN
MRSA, PCR: NEGATIVE
Staphylococcus aureus: NEGATIVE

## 2024-04-24 ENCOUNTER — Encounter (HOSPITAL_COMMUNITY): Payer: Self-pay

## 2024-04-24 NOTE — Progress Notes (Signed)
 Case: [id number] Date/Time: 05/03/24 0815   Procedure: ARTHROPLASTY, KNEE, TOTAL (Right: Knee)   Anesthesia type: Spinal   Pre-op diagnosis: OA RIGHT KNEE   Location: [facility] ROOM 08 / [facility]   Surgeons: Edna Toribio LABOR, MD        DISCUSSION: Scott Townsend is a 68 yo male who presents to PAT prior to surgery above. PMH former smoking, HTN, A-fib, GERD, Barrett's esophagus, prediabetes, arthritis  Patient follows with cardiology for history of A-fib.  Has had multiple cardioversions in the past and is on flecainide .  Last seen in clinic on 03/07/2024 for routine follow-up and preop clearance.  Noted to be doing generally well.  He is off anticoagulation due to cost and was advised that due to low burden of A-fib his risk of CVA would be low.  He was cleared for surgery:  The patient is at acceptable risk for the planned surgery.  No change in therapy.  No further testing is indicated.  Is going for low risk procedure and has a reasonable functional level and no high risk features or findings.     VS: BP (!) 132/58 Comment: right arm sitting  Pulse 82   Temp 36.7 C (Oral)   Resp 16   Ht 5' 10 (1.778 m)   Wt 102.1 kg   SpO2 98%   BMI 32.28 kg/m   PROVIDERS: McElwee, Lauren A, NP   LABS: Labs reviewed: Acceptable for surgery. (all labs ordered are listed, but only abnormal results are displayed)  Labs Reviewed  CBC WITH DIFFERENTIAL/PLATELET - Abnormal; Notable for the following components:      Result Value   Monocytes Absolute 1.1 (*)    All other components within normal limits  COMPREHENSIVE METABOLIC PANEL WITH GFR - Abnormal; Notable for the following components:   Glucose, Bld 117 (*)    All other components within normal limits  SURGICAL PCR SCREEN  TYPE AND SCREEN     IMAGES:   EKG 02/29/2024  Normal sinus rhythm, rate 72  CV:  Past Medical History:  Diagnosis Date   Arthritis    Atrial fibrillation (HCC)    Barrett esophagus    Dysrhythmia     A.fib  dx in 2013   GERD (gastroesophageal reflux disease)    Hypertension    Pre-diabetes     Past Surgical History:  Procedure Laterality Date   CARDIOVERSION  2013   x 3  by Dr. Ladona    MEDICATIONS:  diltiazem  (CARDIZEM  CD) 300 MG 24 hr capsule   diphenhydramine-acetaminophen  (TYLENOL  PM) 25-500 MG TABS   flecainide  (TAMBOCOR ) 100 MG tablet   hydrALAZINE  (APRESOLINE ) 25 MG tablet   lansoprazole (PREVACID) 30 MG capsule   ramipril  (ALTACE ) 10 MG capsule   No current facility-administered medications for this encounter.    Burnard CHRISTELLA Odis DEVONNA [facility] Short Stay/Anesthesiology [facility] Phone [phone] 04/24/2024 2:56 PM

## 2024-04-24 NOTE — Anesthesia Preprocedure Evaluation (Addendum)
 Anesthesia Evaluation  Patient identified by MRN, date of birth, ID band Patient awake    Reviewed: Allergy & Precautions, NPO status , Patient's Chart, lab work & pertinent test results  Airway Mallampati: I  TM Distance: >3 FB Neck ROM: Full    Dental no notable dental hx. (+) Edentulous Upper, Edentulous Lower   Pulmonary former smoker   Pulmonary exam normal breath sounds clear to auscultation       Cardiovascular hypertension, Pt. on medications (-) angina (-) Past MI Normal cardiovascular exam(-) dysrhythmias (on flecanide)  Rhythm:Regular Rate:Normal     Neuro/Psych    GI/Hepatic Neg liver ROS,GERD  Medicated and Controlled,,Hx of Barretts   Endo/Other  negative endocrine ROS    Renal/GU Lab Results      Component                Value               Date                                 K                        3.6                 04/20/2024               CREATININE               1.02                04/20/2024                GFRNONAA                 >60                 04/20/2024                  GLUCOSE                  117 (H)             04/20/2024                Musculoskeletal  (+) Arthritis ,    Abdominal   Peds  Hematology Lab Results      Component                Value               Date                      WBC                      9.0                 04/20/2024                HGB                      14.0                04/20/2024                HCT  41.8                04/20/2024                MCV                      94.8                04/20/2024                PLT                      215                 04/20/2024              Anesthesia Other Findings   Reproductive/Obstetrics                              Anesthesia Physical Anesthesia Plan  ASA: 3  Anesthesia Plan: Spinal and Regional   Post-op Pain Management: Regional block* and  Ofirmev  IV (intra-op)*   Induction:   PONV Risk Score and Plan: Propofol  infusion and Treatment may vary due to age or medical condition  Airway Management Planned: Nasal Cannula and Natural Airway  Additional Equipment: None  Intra-op Plan:   Post-operative Plan:   Informed Consent: I have reviewed the patients History and Physical, chart, labs and discussed the procedure including the risks, benefits and alternatives for the proposed anesthesia with the patient or authorized representative who has indicated his/her understanding and acceptance.     Dental advisory given  Plan Discussed with: CRNA and Surgeon  Anesthesia Plan Comments: (See PAT note from 7/3)         Anesthesia Quick Evaluation

## 2024-04-26 NOTE — Care Plan (Signed)
 Ortho Bundle Case Management Note  Patient Details  Name: Scott Townsend MRN: [id number] Date of Birth: [birth year]  patient was seen in the office by PA for H&P. will discharge to home with family. has DME. OPPT set up [facility] [address] discharge instructions discussed and questions answered. CM left VM for patient to call with further questions. Patient and MD in agreement with plan. Choice offered.                     DME Arranged:  Vannie rolling DME Agency:  [employer]  HH Arranged:    HH Agency:     Additional Comments: Please contact me with any questions of if this plan should need to change.  Charlies Pitch,  RN,BSN,MHA,CCM  [facility] Orthopaedic Specialist  [phone] 04/26/2024, 4:58 PM

## 2024-04-28 ENCOUNTER — Other Ambulatory Visit: Payer: Self-pay | Admitting: Cardiology

## 2024-05-03 ENCOUNTER — Ambulatory Visit (HOSPITAL_COMMUNITY)
Admission: RE | Admit: 2024-05-03 | Discharge: 2024-05-03 | Disposition: A | Discharge status: Home / Self Care | Source: Ambulatory Visit | Attending: Orthopedic Surgery | Admitting: Orthopedic Surgery

## 2024-05-03 ENCOUNTER — Ambulatory Visit (HOSPITAL_COMMUNITY): Payer: Self-pay | Admitting: Medical

## 2024-05-03 ENCOUNTER — Other Ambulatory Visit: Payer: Self-pay

## 2024-05-03 ENCOUNTER — Encounter (HOSPITAL_COMMUNITY): Payer: Self-pay | Admitting: Orthopedic Surgery

## 2024-05-03 ENCOUNTER — Ambulatory Visit (HOSPITAL_COMMUNITY): Payer: Self-pay | Admitting: Anesthesiology

## 2024-05-03 ENCOUNTER — Encounter (HOSPITAL_COMMUNITY)
Admission: RE | Disposition: A | Discharge status: Home / Self Care | Payer: Self-pay | Source: Ambulatory Visit | Attending: Orthopedic Surgery

## 2024-05-03 ENCOUNTER — Encounter: Payer: Self-pay | Admitting: Nurse Practitioner

## 2024-05-03 ENCOUNTER — Ambulatory Visit (HOSPITAL_COMMUNITY)

## 2024-05-03 DIAGNOSIS — M1711 Unilateral primary osteoarthritis, right knee: Secondary | ICD-10-CM | POA: Insufficient documentation

## 2024-05-03 DIAGNOSIS — K219 Gastro-esophageal reflux disease without esophagitis: Secondary | ICD-10-CM | POA: Insufficient documentation

## 2024-05-03 DIAGNOSIS — Z79899 Other long term (current) drug therapy: Secondary | ICD-10-CM | POA: Insufficient documentation

## 2024-05-03 DIAGNOSIS — Z8249 Family history of ischemic heart disease and other diseases of the circulatory system: Secondary | ICD-10-CM | POA: Insufficient documentation

## 2024-05-03 DIAGNOSIS — I10 Essential (primary) hypertension: Secondary | ICD-10-CM | POA: Insufficient documentation

## 2024-05-03 DIAGNOSIS — Z87891 Personal history of nicotine dependence: Secondary | ICD-10-CM | POA: Insufficient documentation

## 2024-05-03 HISTORY — PX: TOTAL KNEE ARTHROPLASTY: SHX125

## 2024-05-03 LAB — ABO/RH: ABO/RH(D): O POS

## 2024-05-03 SURGERY — ARTHROPLASTY, KNEE, TOTAL
Anesthesia: Regional | Site: Knee | Laterality: Right

## 2024-05-03 MED ORDER — OXYCODONE HCL 5 MG PO TABS
5.0000 mg | ORAL_TABLET | Freq: Once | ORAL | Status: AC | PRN
  Administered 2024-05-03: 5 mg via ORAL

## 2024-05-03 MED ORDER — PROPOFOL 1000 MG/100ML IV EMUL
INTRAVENOUS | Status: AC
  Filled 2024-05-03: qty 100

## 2024-05-03 MED ORDER — ONDANSETRON HCL 4 MG/2ML IJ SOLN
INTRAMUSCULAR | Status: DC | PRN
Start: 2024-05-03 — End: 2024-05-03
  Administered 2024-05-03: 4 mg via INTRAVENOUS

## 2024-05-03 MED ORDER — FENTANYL CITRATE (PF) 100 MCG/2ML IJ SOLN
INTRAMUSCULAR | Status: DC | PRN
  Administered 2024-05-03 (×2): 50 ug via INTRAVENOUS

## 2024-05-03 MED ORDER — CHLORHEXIDINE GLUCONATE 0.12 % MT SOLN
15.0000 mL | Freq: Once | OROMUCOSAL | Status: AC
  Administered 2024-05-03: 15 mL via OROMUCOSAL

## 2024-05-03 MED ORDER — MIDAZOLAM HCL 2 MG/2ML IJ SOLN
INTRAMUSCULAR | Status: AC
  Filled 2024-05-03: qty 2

## 2024-05-03 MED ORDER — OXYCODONE HCL 5 MG/5ML PO SOLN: 5.0000 mg | Freq: Once | ORAL | Status: AC | PRN

## 2024-05-03 MED ORDER — METHOCARBAMOL 1000 MG/10ML IJ SOLN: 500.0000 mg | Freq: Four times a day (QID) | INTRAMUSCULAR | Status: DC | PRN

## 2024-05-03 MED ORDER — DEXAMETHASONE SODIUM PHOSPHATE 10 MG/ML IJ SOLN: 8.0000 mg | Freq: Once | INTRAMUSCULAR | Status: DC

## 2024-05-03 MED ORDER — DEXAMETHASONE SODIUM PHOSPHATE 10 MG/ML IJ SOLN
INTRAMUSCULAR | Status: DC | PRN
  Administered 2024-05-03: 10 mg via INTRAVENOUS

## 2024-05-03 MED ORDER — METHOCARBAMOL 500 MG PO TABS
500.0000 mg | ORAL_TABLET | Freq: Four times a day (QID) | ORAL | Status: DC | PRN
  Administered 2024-05-03: 500 mg via ORAL

## 2024-05-03 MED ORDER — SODIUM CHLORIDE 0.9 % IR SOLN
Status: DC | PRN
  Administered 2024-05-03: 3000 mL

## 2024-05-03 MED ORDER — ORAL CARE MOUTH RINSE: 15.0000 mL | Freq: Once | OROMUCOSAL | Status: AC

## 2024-05-03 MED ORDER — 0.9 % SODIUM CHLORIDE (POUR BTL) OPTIME
TOPICAL | Status: DC | PRN
  Administered 2024-05-03: 1000 mL

## 2024-05-03 MED ORDER — WATER FOR IRRIGATION, STERILE IR SOLN
Status: DC | PRN
  Administered 2024-05-03: 2000 mL

## 2024-05-03 MED ORDER — BUPIVACAINE LIPOSOME 1.3 % IJ SUSP
INTRAMUSCULAR | Status: AC
Start: 2024-05-03 — End: 2024-05-03
  Filled 2024-05-03: qty 20

## 2024-05-03 MED ORDER — SODIUM CHLORIDE 0.9 % IV SOLN: INTRAVENOUS | Status: DC

## 2024-05-03 MED ORDER — AMISULPRIDE (ANTIEMETIC) 5 MG/2ML IV SOLN: 10.0000 mg | Freq: Once | INTRAVENOUS | Status: DC | PRN

## 2024-05-03 MED ORDER — DEXAMETHASONE SODIUM PHOSPHATE 10 MG/ML IJ SOLN
INTRAMUSCULAR | Status: AC
  Filled 2024-05-03: qty 1

## 2024-05-03 MED ORDER — BUPIVACAINE LIPOSOME 1.3 % IJ SUSP: 20.0000 mL | Freq: Once | INTRAMUSCULAR | Status: DC

## 2024-05-03 MED ORDER — ONDANSETRON HCL 4 MG PO TABS: 4.0000 mg | ORAL_TABLET | Freq: Four times a day (QID) | ORAL | Status: DC | PRN

## 2024-05-03 MED ORDER — HYDROMORPHONE HCL 1 MG/ML IJ SOLN: 0.2500 mg | INTRAMUSCULAR | Status: DC | PRN

## 2024-05-03 MED ORDER — ACETAMINOPHEN 500 MG PO TABS: 1000.0000 mg | ORAL_TABLET | Freq: Three times a day (TID) | ORAL | Status: AC | PRN

## 2024-05-03 MED ORDER — ISOPROPYL ALCOHOL 70 % SOLN
Status: AC
  Filled 2024-05-03: qty 480

## 2024-05-03 MED ORDER — CEFAZOLIN SODIUM-DEXTROSE 2-4 GM/100ML-% IV SOLN: 2.0000 g | Freq: Four times a day (QID) | INTRAVENOUS | Status: DC

## 2024-05-03 MED ORDER — CEFAZOLIN SODIUM-DEXTROSE 2-4 GM/100ML-% IV SOLN
2.0000 g | INTRAVENOUS | Status: AC
  Administered 2024-05-03: 2 g via INTRAVENOUS
  Filled 2024-05-03: qty 100

## 2024-05-03 MED ORDER — NAPROXEN 500 MG PO TBEC: 500.0000 mg | DELAYED_RELEASE_TABLET | Freq: Two times a day (BID) | ORAL | 0 refills | Status: AC

## 2024-05-03 MED ORDER — PHENYLEPHRINE 80 MCG/ML (10ML) SYRINGE FOR IV PUSH (FOR BLOOD PRESSURE SUPPORT)
PREFILLED_SYRINGE | INTRAVENOUS | Status: DC | PRN
  Administered 2024-05-03 (×3): 160 ug via INTRAVENOUS

## 2024-05-03 MED ORDER — BUPIVACAINE-EPINEPHRINE 0.25% -1:200000 IJ SOLN
INTRAMUSCULAR | Status: DC | PRN
  Administered 2024-05-03: 30 mL

## 2024-05-03 MED ORDER — PROPOFOL 500 MG/50ML IV EMUL
INTRAVENOUS | Status: DC | PRN
  Administered 2024-05-03: 120 ug/kg/min via INTRAVENOUS

## 2024-05-03 MED ORDER — POLYETHYLENE GLYCOL 3350 17 G PO PACK: 17.0000 g | PACK | Freq: Every day | ORAL | 0 refills | Status: AC

## 2024-05-03 MED ORDER — MIDAZOLAM HCL 5 MG/5ML IJ SOLN
INTRAMUSCULAR | Status: DC | PRN
  Administered 2024-05-03: 2 mg via INTRAVENOUS

## 2024-05-03 MED ORDER — METHOCARBAMOL 500 MG PO TABS
ORAL_TABLET | ORAL | Status: AC
  Filled 2024-05-03: qty 1

## 2024-05-03 MED ORDER — LACTATED RINGERS IV BOLUS: 250.0000 mL | Freq: Once | INTRAVENOUS | Status: DC

## 2024-05-03 MED ORDER — LACTATED RINGERS IV BOLUS
250.0000 mL | Freq: Once | INTRAVENOUS | Status: AC
  Administered 2024-05-03: 250 mL via INTRAVENOUS

## 2024-05-03 MED ORDER — ACETAMINOPHEN 500 MG PO TABS
1000.0000 mg | ORAL_TABLET | Freq: Four times a day (QID) | ORAL | Status: DC
  Administered 2024-05-03: 1000 mg via ORAL

## 2024-05-03 MED ORDER — LACTATED RINGERS IV SOLN: INTRAVENOUS | Status: DC

## 2024-05-03 MED ORDER — FENTANYL CITRATE (PF) 100 MCG/2ML IJ SOLN
INTRAMUSCULAR | Status: AC
Start: 2024-05-03 — End: 2024-05-03
  Filled 2024-05-03: qty 2

## 2024-05-03 MED ORDER — ONDANSETRON HCL 4 MG/2ML IJ SOLN
INTRAMUSCULAR | Status: AC
  Filled 2024-05-03: qty 2

## 2024-05-03 MED ORDER — CLONIDINE HCL (ANALGESIA) 100 MCG/ML EP SOLN
EPIDURAL | Status: DC | PRN
  Administered 2024-05-03: 100 ug

## 2024-05-03 MED ORDER — ACETAMINOPHEN 10 MG/ML IV SOLN
1000.0000 mg | Freq: Once | INTRAVENOUS | Status: DC | PRN
Start: 2024-05-03 — End: 2024-05-03

## 2024-05-03 MED ORDER — BUPIVACAINE-EPINEPHRINE (PF) 0.25% -1:200000 IJ SOLN
INTRAMUSCULAR | Status: AC
  Filled 2024-05-03: qty 30

## 2024-05-03 MED ORDER — KETOROLAC TROMETHAMINE 15 MG/ML IJ SOLN
7.5000 mg | Freq: Four times a day (QID) | INTRAMUSCULAR | Status: DC
  Administered 2024-05-03: 7.5 mg via INTRAVENOUS

## 2024-05-03 MED ORDER — PHENYLEPHRINE HCL-NACL 20-0.9 MG/250ML-% IV SOLN
INTRAVENOUS | Status: DC | PRN
  Administered 2024-05-03: 50 ug/min via INTRAVENOUS

## 2024-05-03 MED ORDER — METHOCARBAMOL 500 MG PO TABS: 500.0000 mg | ORAL_TABLET | Freq: Three times a day (TID) | ORAL | 0 refills | Status: AC | PRN

## 2024-05-03 MED ORDER — ONDANSETRON HCL 4 MG/2ML IJ SOLN: 4.0000 mg | Freq: Four times a day (QID) | INTRAMUSCULAR | Status: DC | PRN

## 2024-05-03 MED ORDER — ACETAMINOPHEN 500 MG PO TABS
1000.0000 mg | ORAL_TABLET | Freq: Once | ORAL | Status: AC
  Administered 2024-05-03: 1000 mg via ORAL
  Filled 2024-05-03: qty 2

## 2024-05-03 MED ORDER — PHENYLEPHRINE 80 MCG/ML (10ML) SYRINGE FOR IV PUSH (FOR BLOOD PRESSURE SUPPORT)
PREFILLED_SYRINGE | INTRAVENOUS | Status: AC
  Filled 2024-05-03: qty 10

## 2024-05-03 MED ORDER — SODIUM CHLORIDE 0.9% FLUSH
INTRAVENOUS | Status: DC | PRN
  Administered 2024-05-03: 30 mL

## 2024-05-03 MED ORDER — ISOPROPYL ALCOHOL 70 % SOLN
Status: DC | PRN
Start: 2024-05-03 — End: 2024-05-03
  Administered 2024-05-03: 1 via TOPICAL

## 2024-05-03 MED ORDER — LACTATED RINGERS IV BOLUS
500.0000 mL | Freq: Once | INTRAVENOUS | Status: AC
  Administered 2024-05-03: 500 mL via INTRAVENOUS

## 2024-05-03 MED ORDER — OXYCODONE HCL 5 MG PO TABS: 5.0000 mg | ORAL_TABLET | ORAL | Status: DC | PRN

## 2024-05-03 MED ORDER — RIVAROXABAN 10 MG PO TABS: 10.0000 mg | ORAL_TABLET | Freq: Every day | ORAL | 0 refills | Status: AC

## 2024-05-03 MED ORDER — BUPIVACAINE IN DEXTROSE 0.75-8.25 % IT SOLN
INTRATHECAL | Status: DC | PRN
Start: 2024-05-03 — End: 2024-05-03
  Administered 2024-05-03: 12 mg via INTRATHECAL

## 2024-05-03 MED ORDER — KETOROLAC TROMETHAMINE 15 MG/ML IJ SOLN
INTRAMUSCULAR | Status: AC
  Filled 2024-05-03: qty 1

## 2024-05-03 MED ORDER — ACETAMINOPHEN 500 MG PO TABS
ORAL_TABLET | ORAL | Status: AC
Start: 2024-05-03 — End: 2024-05-03
  Filled 2024-05-03: qty 2

## 2024-05-03 MED ORDER — ONDANSETRON HCL 4 MG/2ML IJ SOLN: 4.0000 mg | Freq: Once | INTRAMUSCULAR | Status: DC | PRN

## 2024-05-03 MED ORDER — POVIDONE-IODINE 10 % EX SWAB
2.0000 | Freq: Once | CUTANEOUS | Status: AC
  Administered 2024-05-03: 2 via TOPICAL

## 2024-05-03 MED ORDER — HYDROMORPHONE HCL 1 MG/ML IJ SOLN: 0.5000 mg | INTRAMUSCULAR | Status: DC | PRN

## 2024-05-03 MED ORDER — ROPIVACAINE HCL 5 MG/ML IJ SOLN
INTRAMUSCULAR | Status: DC | PRN
Start: 2024-05-03 — End: 2024-05-03
  Administered 2024-05-03: 25 mL via PERINEURAL

## 2024-05-03 MED ORDER — OXYCODONE HCL 5 MG PO TABS
ORAL_TABLET | ORAL | Status: AC
Start: 2024-05-03 — End: 2024-05-03
  Filled 2024-05-03: qty 1

## 2024-05-03 MED ORDER — TRANEXAMIC ACID-NACL 1000-0.7 MG/100ML-% IV SOLN
1000.0000 mg | INTRAVENOUS | Status: AC
  Administered 2024-05-03: 1000 mg via INTRAVENOUS
  Filled 2024-05-03: qty 100

## 2024-05-03 MED ORDER — ONDANSETRON HCL 4 MG PO TABS: 4.0000 mg | ORAL_TABLET | Freq: Three times a day (TID) | ORAL | 0 refills | Status: AC | PRN

## 2024-05-03 MED ORDER — LACTATED RINGERS IV SOLN: INTRAVENOUS | Status: DC | PRN

## 2024-05-03 MED ORDER — OXYCODONE HCL 5 MG PO TABS: 5.0000 mg | ORAL_TABLET | ORAL | 0 refills | Status: AC | PRN

## 2024-05-03 MED ORDER — PROPOFOL 10 MG/ML IV BOLUS
INTRAVENOUS | Status: DC | PRN
  Administered 2024-05-03 (×2): 50 mg via INTRAVENOUS

## 2024-05-03 MED ORDER — SODIUM CHLORIDE (PF) 0.9 % IJ SOLN
INTRAMUSCULAR | Status: AC
  Filled 2024-05-03: qty 30

## 2024-05-03 SURGICAL SUPPLY — 50 items
BAG COUNTER SPONGE SURGICOUNT (BAG) IMPLANT
BLADE SAG 18X100X1.27 (BLADE) ×1 IMPLANT
BLADE SAW SAG 35X64 .89 (BLADE) ×1 IMPLANT
BLADE SAW SGTL 11.0X1.19X90.0M (BLADE) IMPLANT
BNDG COHESIVE 3X5 TAN ST LF (GAUZE/BANDAGES/DRESSINGS) ×1 IMPLANT
BNDG ELASTIC 6X10 VLCR STRL LF (GAUZE/BANDAGES/DRESSINGS) ×1 IMPLANT
BNDG ELASTIC 6X15 VLCR STRL LF (GAUZE/BANDAGES/DRESSINGS) IMPLANT
BOWL SMART MIX CTS (DISPOSABLE) IMPLANT
CHLORAPREP W/TINT 26 (MISCELLANEOUS) ×2 IMPLANT
COMPONENT PATELLA PEG 3 32 (Joint) IMPLANT
COVER SURGICAL LIGHT HANDLE (MISCELLANEOUS) ×1 IMPLANT
CUFF TRNQT CYL 34X4.125X (TOURNIQUET CUFF) ×1 IMPLANT
DERMABOND ADVANCED .7 DNX12 (GAUZE/BANDAGES/DRESSINGS) ×1 IMPLANT
DRAPE INCISE IOBAN 85X60 (DRAPES) ×1 IMPLANT
DRAPE SHEET LG 3/4 BI-LAMINATE (DRAPES) ×1 IMPLANT
DRAPE U-SHAPE 47X51 STRL (DRAPES) ×1 IMPLANT
DRSG AQUACEL AG ADV 3.5X10 (GAUZE/BANDAGES/DRESSINGS) ×1 IMPLANT
ELECT REM PT RETURN 15FT ADLT (MISCELLANEOUS) ×1 IMPLANT
GAUZE SPONGE 4X4 12PLY STRL (GAUZE/BANDAGES/DRESSINGS) ×1 IMPLANT
GLOVE BIO SURGEON STRL SZ 6.5 (GLOVE) ×2 IMPLANT
GLOVE BIOGEL PI IND STRL 6.5 (GLOVE) ×1 IMPLANT
GLOVE BIOGEL PI IND STRL 8 (GLOVE) ×1 IMPLANT
GLOVE SURG ORTHO 8.0 STRL STRW (GLOVE) ×2 IMPLANT
GOWN STRL REUS W/ TWL XL LVL3 (GOWN DISPOSABLE) ×2 IMPLANT
HOLDER FOLEY CATH W/STRAP (MISCELLANEOUS) ×1 IMPLANT
HOOD PEEL AWAY T7 (MISCELLANEOUS) ×3 IMPLANT
INSERT TIB ARTI SZ8-11 RT 11 (Joint) IMPLANT
KIT TURNOVER KIT A (KITS) ×1 IMPLANT
MANIFOLD NEPTUNE II (INSTRUMENTS) ×1 IMPLANT
MARKER SKIN DUAL TIP RULER LAB (MISCELLANEOUS) ×1 IMPLANT
NS IRRIG 1000ML POUR BTL (IV SOLUTION) ×1 IMPLANT
PACK TOTAL KNEE CUSTOM (KITS) ×1 IMPLANT
PENCIL SMOKE EVACUATOR (MISCELLANEOUS) ×1 IMPLANT
PIN DRILL HDLS TROCAR 75 4PK (PIN) IMPLANT
PROSTHESIS FEM KNEE PS STD9 RT (Joint) IMPLANT
PROSTHESIS TIB KNEE PS 0D F RT (Joint) IMPLANT
SCREW HEADED 33MM KNEE (MISCELLANEOUS) IMPLANT
SET HNDPC FAN SPRY TIP SCT (DISPOSABLE) ×1 IMPLANT
SOLUTION IRRIG SURGIPHOR (IV SOLUTION) IMPLANT
SOLUTION PRONTOSAN WOUND 350ML (IRRIGATION / IRRIGATOR) IMPLANT
STRIP CLOSURE SKIN 1/2X4 (GAUZE/BANDAGES/DRESSINGS) ×1 IMPLANT
SUT MNCRL AB 3-0 PS2 18 (SUTURE) ×1 IMPLANT
SUT STRATAFIX 14 PDO 48 VLT (SUTURE) ×1 IMPLANT
SUT VIC AB 2-0 CT2 27 (SUTURE) ×2 IMPLANT
SUTURE STRATFX 0 PDS 27 VIOLET (SUTURE) ×1 IMPLANT
SYR 50ML LL SCALE MARK (SYRINGE) ×1 IMPLANT
TRAY FOLEY MTR SLVR 14FR STAT (SET/KITS/TRAYS/PACK) IMPLANT
TUBE SUCTION HIGH CAP CLEAR NV (SUCTIONS) ×1 IMPLANT
UNDERPAD 30X36 HEAVY ABSORB (UNDERPADS AND DIAPERS) ×1 IMPLANT
WRAP KNEE MAXI GEL POST OP (GAUZE/BANDAGES/DRESSINGS) ×1 IMPLANT

## 2024-05-03 NOTE — Interval H&P Note (Signed)

## 2024-05-03 NOTE — Discharge Instructions (Signed)
 INSTRUCTIONS AFTER JOINT REPLACEMENT   Remove items at home which could result in a fall. This includes throw rugs or furniture in walking pathways ICE to the affected joint every three hours while awake for 30 minutes at a time, for at least the first 3-5 days, and then as needed for pain and swelling.  Continue to use ice for pain and swelling. You may notice swelling that will progress down to the foot and ankle.  This is normal after surgery.  Elevate your leg when you are not up walking on it.   Continue to use the breathing machine you got in the hospital (incentive spirometer) which will help keep your temperature down.  It is common for your temperature to cycle up and down following surgery, especially at night when you are not up moving around and exerting yourself.  The breathing machine keeps your lungs expanded and your temperature down.  DIET:  As you were doing prior to hospitalization, we recommend a well-balanced diet.  DRESSING / WOUND CARE / SHOWERING:  Keep the surgical dressing until follow up.  The dressing is water  proof, so you can shower without any extra covering.  IF THE DRESSING FALLS OFF or the wound gets wet inside, change the dressing with sterile gauze.  Please use good hand washing techniques before changing the dressing.  Do not use any lotions or creams on the incision until instructed by your surgeon.    ACTIVITY  Increase activity slowly as tolerated, but follow the weight bearing instructions below.   No driving for 6 weeks or until further direction given by your physician.  You cannot drive while taking narcotics.  No lifting or carrying greater than 10 lbs. until further directed by your surgeon. Avoid periods of inactivity such as sitting longer than an hour when not asleep. This helps prevent blood clots.  You may return to work once you are authorized by your doctor.   WEIGHT BEARING: Weight bearing as tolerated with assist device (walker, cane, etc) as  directed, use it as long as suggested by your surgeon or therapist, typically at least 4-6 weeks.  EXERCISES  Results after joint replacement surgery are often greatly improved when you follow the exercise, range of motion and muscle strengthening exercises prescribed by your doctor. Safety measures are also important to protect the joint from further injury. Any time any of these exercises cause you to have increased pain or swelling, decrease what you are doing until you are comfortable again and then slowly increase them. If you have problems or questions, call your caregiver or physical therapist for advice.   Rehabilitation is important following a joint replacement. After just a few days of immobilization, the muscles of the leg can become weakened and shrink (atrophy).  These exercises are designed to build up the tone and strength of the thigh and leg muscles and to improve motion. Often times heat used for twenty to thirty minutes before working out will loosen up your tissues and help with improving the range of motion but do not use heat for the first two weeks following surgery (sometimes heat can increase post-operative swelling).   These exercises can be done on a training (exercise) mat, on the floor, on a table or on a bed. Use whatever works the best and is most comfortable for you.    Use music or television while you are exercising so that the exercises are a pleasant break in your day. This will make your life  better with the exercises acting as a break in your routine that you can look forward to.   Perform all exercises about fifteen times, three times per day or as directed.  You should exercise both the operative leg and the other leg as well.  Exercises include:   Quad Sets - Tighten up the muscle on the front of the thigh (Quad) and hold for 5-10 seconds.   Straight Leg Raises - With your knee straight (if you were given a brace, keep it on), lift the leg to 60 degrees, hold  for 3 seconds, and slowly lower the leg.  Perform this exercise against resistance later as your leg gets stronger.  Leg Slides: Lying on your back, slowly slide your foot toward your buttocks, bending your knee up off the floor (only go as far as is comfortable). Then slowly slide your foot back down until your leg is flat on the floor again.  Angel Wings: Lying on your back spread your legs to the side as far apart as you can without causing discomfort.  Hamstring Strength:  Lying on your back, push your heel against the floor with your leg straight by tightening up the muscles of your buttocks.  Repeat, but this time bend your knee to a comfortable angle, and push your heel against the floor.  You may put a pillow under the heel to make it more comfortable if necessary.   A rehabilitation program following joint replacement surgery can speed recovery and prevent re-injury in the future due to weakened muscles. Contact your doctor or a physical therapist for more information on knee rehabilitation.   CONSTIPATION:  Constipation is defined medically as fewer than three stools per week and severe constipation as less than one stool per week.  Even if you have a regular bowel pattern at home, your normal regimen is likely to be disrupted due to multiple reasons following surgery.  Combination of anesthesia, postoperative narcotics, change in appetite and fluid intake all can affect your bowels.   YOU MUST use at least one of the following options; they are listed in order of increasing strength to get the job done.  They are all available over the counter, and you may need to use some, POSSIBLY even all of these options:    Drink plenty of fluids (prune juice may be helpful) and high fiber foods Colace 100 mg by mouth twice a day  Senokot for constipation as directed and as needed Dulcolax (bisacodyl), take with full glass of water   Miralax  (polyethylene glycol) once or twice a day as needed.  If you  have tried all these things and are unable to have a bowel movement in the first 3-4 days after surgery call either your surgeon or your primary doctor.    If you experience loose stools or diarrhea, hold the medications until you stool forms back up.  If your symptoms do not get better within 1 week or if they get worse, check with your doctor.  If you experience the worst abdominal pain ever or develop nausea or vomiting, please contact the office immediately for further recommendations for treatment.  ITCHING:  If you experience itching with your medications, try taking only a single pain pill, or even half a pain pill at a time.  You can also use Benadryl over the counter for itching or also to help with sleep.   TED HOSE STOCKINGS:  Use stockings on both legs until for at least 2 weeks or  as directed by physician office. They may be removed at night for sleeping.  MEDICATIONS:  See your medication summary on the "After Visit Summary" that nursing will review with you.  You may have some home medications which will be placed on hold until you complete the course of blood thinner medication.  It is important for you to complete the blood thinner medication as prescribed.  Blood clot prevention (DVT Prophylaxis): After surgery you are at an increased risk for a blood clot.  You were prescribed a blood thinner, Xarelto , to be taken once daily for a total of 4 weeks from surgery to help reduce your risk of getting a blood clot.  Signs of a pulmonary embolus (blood clot in the lungs) include sudden short of breath, feeling lightheaded or dizzy, chest pain with a deep breath, rapid pulse rapid breathing.  Signs of a blood clot in your arms or legs include new unexplained swelling and cramping, warm, red or darkened skin around the painful area.  Please call the office or 911 right away if these signs or symptoms develop.  PRECAUTIONS:   If you experience chest pain or shortness of breath - call 911  immediately for transfer to the hospital emergency department.   If you develop a fever greater that 101 F, purulent drainage from wound, increased redness or drainage from wound, foul odor from the wound/dressing, or calf pain - CONTACT YOUR SURGEON.                                                   FOLLOW-UP APPOINTMENTS:  If you do not already have a post-op appointment, please call the office for an appointment to be seen by your surgeon.  Guidelines for how soon to be seen are listed in your "After Visit Summary", but are typically between 2-3 weeks after surgery.  If you have a specialized bandage, you may be told to follow up 1 week after surgery.  OTHER INSTRUCTIONS:  Knee Replacement:  Do not place pillow under knee, focus on keeping the knee straight while resting.  Place foam block, curve side up under heel at all times except when walking.  DO NOT modify, tear, cut, or change the foam block in any way.  POST-OPERATIVE OPIOID TAPER INSTRUCTIONS: It is important to wean off of your opioid medication as soon as possible. If you do not need pain medication after your surgery it is ok to stop day one. Opioids include: Codeine, Hydrocodone(Norco, Vicodin), Oxycodone (Percocet, oxycontin ) and hydromorphone  amongst others.  Long term and even short term use of opiods can cause: Increased pain response Dependence Constipation Depression Respiratory depression And more.  Withdrawal symptoms can include Flu like symptoms Nausea, vomiting And more Techniques to manage these symptoms Hydrate well Eat regular healthy meals Stay active Use relaxation techniques(deep breathing, meditating, yoga) Do Not substitute Alcohol  to help with tapering If you have been on opioids for less than two weeks and do not have pain than it is ok to stop all together.  Plan to wean off of opioids This plan should start within one week post op of your joint replacement. Maintain the same interval or time  between taking each dose and first decrease the dose.  Cut the total daily intake of opioids by one tablet each day Next start to increase the time between doses.  The last dose that should be eliminated is the evening dose.   MAKE SURE YOU:  Understand these instructions.  Get help right away if you are not doing well or get worse.    Thank you for letting us  be a part of your medical care team.  It is a privilege we respect greatly.  We hope these instructions will help you stay on track for a fast and full recovery!

## 2024-05-03 NOTE — Transfer of Care (Signed)
 Immediate Anesthesia Transfer of Care Note  Patient: Scott Townsend  Procedure(s) Performed: ARTHROPLASTY, KNEE, TOTAL (Right: Knee)  Patient Location: PACU  Anesthesia Type:MAC, Regional, and Spinal  Level of Consciousness: awake and alert   Airway & Oxygen Therapy: Patient Spontanous Breathing and Patient connected to face mask oxygen  Post-op Assessment: Report given to RN and Post -op Vital signs reviewed and stable  Post vital signs: Reviewed and stable  Last Vitals:  Vitals Value Taken Time  BP 118/70 05/03/24 10:34  Temp    Pulse 75 05/03/24 10:36  Resp 17 05/03/24 10:36  SpO2 96 % 05/03/24 10:36  Vitals shown include unfiled device data.  Last Pain:  Vitals:   05/03/24 0705  TempSrc:   PainSc: 4          Complications: No notable events documented.

## 2024-05-03 NOTE — Anesthesia Postprocedure Evaluation (Signed)
 Anesthesia Post Note  Patient: Scott Townsend  Procedure(s) Performed: ARTHROPLASTY, KNEE, TOTAL (Right: Knee)     Patient location during evaluation: Nursing Unit Anesthesia Type: Regional Level of consciousness: oriented and awake and alert Pain management: pain level controlled Vital Signs Assessment: post-procedure vital signs reviewed and stable Respiratory status: spontaneous breathing and respiratory function stable Cardiovascular status: blood pressure returned to baseline and stable Postop Assessment: no headache, no backache, no apparent nausea or vomiting and patient able to bend at knees Anesthetic complications: no   No notable events documented.  Last Vitals:  Vitals:   05/03/24 1145 05/03/24 1200  BP: (!) 151/77 (!) 150/74  Pulse: 63 65  Resp: 13 15  Temp:  (!) 36.1 C  SpO2: 96% 96%    Last Pain:  Vitals:   05/03/24 1236  TempSrc:   PainSc: 5                  Scott Townsend

## 2024-05-03 NOTE — Anesthesia Procedure Notes (Signed)
 Spinal  Patient location during procedure: OR Start time: 05/03/2024 8:25 AM End time: 05/03/2024 8:30 AM Reason for block: surgical anesthesia Staffing Performed: anesthesiologist  Anesthesiologist: Jefm Garnette LABOR, MD Performed by: Jefm Garnette LABOR, MD Authorized by: Jefm Garnette LABOR, MD   Preanesthetic Checklist Completed: patient identified, IV checked, risks and benefits discussed, surgical consent, monitors and equipment checked, pre-op evaluation and timeout performed Spinal Block Patient position: sitting Prep: DuraPrep and site prepped and draped Patient monitoring: heart rate, cardiac monitor, continuous pulse ox and blood pressure Approach: midline Location: L3-4 Injection technique: single-shot Needle Needle type: Pencan  Needle gauge: 24 G Needle length: 10 cm Needle insertion depth: 6 cm Assessment Sensory level: T4 Events: CSF return Additional Notes  1 Attempt (s). Pt tolerated procedure well.

## 2024-05-03 NOTE — Progress Notes (Signed)
 Orthopedic Tech Progress Note Patient Details:  Scott Townsend 01/07/1956 [id number]  Ortho Devices Type of Ortho Device: Bone foam zero knee Ortho Device/Splint Location: right Ortho Device/Splint Interventions: Ordered, Application, Adjustment   Post Interventions Patient Tolerated: Well Instructions Provided: Adjustment of device, Care of device  [name] 05/03/2024, 11:08 AM

## 2024-05-03 NOTE — Anesthesia Procedure Notes (Signed)
 Anesthesia Regional Block: Adductor canal block   Pre-Anesthetic Checklist: , timeout performed,  Correct Patient, Correct Site, Correct Laterality,  Correct Procedure, Correct Position, site marked,  Risks and benefits discussed,  Surgical consent,  Pre-op evaluation,  At surgeon's request and post-op pain management  Laterality: Lower and Right  Prep: chloraprep       Needles:  Injection technique: Single-shot  Needle Type: Echogenic Needle     Needle Length: 9cm  Needle Gauge: 22     Additional Needles:   Procedures:,,,, ultrasound used (permanent image in chart),,    Narrative:  Start time: 05/03/2024 7:52 AM End time: 05/03/2024 8:00 AM Injection made incrementally with aspirations every 5 mL.  Performed by: Personally  Anesthesiologist: Jefm Garnette LABOR, MD  Additional Notes: Block assessed prior to surgery. Pt tolerated procedure well.

## 2024-05-03 NOTE — Op Note (Signed)
 DATE OF SURGERY:  05/03/2024 TIME: 10:02 AM  PATIENT NAME:  Scott Townsend   AGE: 68 y.o.    PRE-OPERATIVE DIAGNOSIS: End-stage right knee osteoarthritis  POST-OPERATIVE DIAGNOSIS:  Same  PROCEDURE: Press-fit right total Knee Arthroplasty  SURGEON:  Tanganika Barradas A Camren Henthorn, MD   ASSISTANT: Bernarda Mclean, PA-C, present and scrubbed throughout the case, critical for assistance with exposure, retraction, instrumentation, and closure.   OPERATIVE IMPLANTS:  Biomet Zimmer persona size 9 right standard CR femur, right F tibial baseplate, 32 mm press-fit patella, 11 mm MC poly insert Implant Name Type Inv. Item Serial No. Manufacturer Lot No. LRB No. Used Action  PROSTHESIS FEM KNEE PS STD9 RT - [id number] Joint PROSTHESIS FEM KNEE PS STD9 RT  [employer](ORTH,TRAU,BIO,SG) [id number] Right 1 Implanted  PROSTHESIS TIB KNEE PS 0D F RT - [id number] Joint PROSTHESIS TIB KNEE PS 0D F RT  [employer](ORTH,TRAU,BIO,SG) [id number] Right 1 Implanted  COMPONENT PATELLA PEG 3 32 - [id number] Joint COMPONENT PATELLA PEG 3 32  [employer](ORTH,TRAU,BIO,SG) [id number] Right 1 Implanted  INSERT TIB ARTI SZ8-11 RT 11 - [id number] Joint INSERT TIB ARTI SZ8-11 RT 11  [employer](ORTH,TRAU,BIO,SG) [id number] Right 1 Implanted      PREOPERATIVE INDICATIONS:  Scott Townsend is a 68 y.o. year old male with end stage bone on bone degenerative arthritis of the knee who failed conservative treatment, including injections, antiinflammatories, activity modification, and assistive devices, and had significant impairment of their activities of daily living, and elected for Total Knee Arthroplasty.   The risks, benefits, and alternatives were discussed at length including but not limited to the risks of infection, bleeding, nerve injury, stiffness, blood clots, the need for revision surgery, cardiopulmonary complications, among others, and they were willing to proceed.  ESTIMATED BLOOD LOSS: 50cc  OPERATIVE  DESCRIPTION:   Once adequate anesthesia was induced, preoperative antibiotics, 2 gm of ancef ,1 gm of Tranexamic Acid , and 8 mg of Decadron  administered, the patient was positioned supine with a right thigh tourniquet placed.  The right lower extremity was prepped and draped in sterile fashion.  A time-  out was performed identifying the patient, planned procedure, and the appropriate extremity.     The leg was  exsanguinated, tourniquet elevated to 250 mmHg.  A midline incision was  made followed by median parapatellar arthrotomy. Anterior horn of the medial meniscus was released and resected. A medial release was performed, the infrapatellar fat pad was resected with care taken to protect the patellar tendon. The suprapatellar fat was removed to exposed the distal anterior femur. The anterior horn of the lateral meniscus and ACL were released.    Following initial  exposure, I first started with the femur  The femoral  canal was opened with a drill, canal was suctioned to try to prevent fat emboli.  An  intramedullary rod was passed set at 6 degrees valgus, 10 mm. The distal femur was resected.  Following this resection, the tibia was  subluxated anteriorly.  Using the extramedullary guide, 10mm of bone was resected off the proximal lateral tibia.  We confirmed the gap would be  stable medially and laterally with a size 10mm spacer block as well as confirmed that the tibial cut was perpendicular in the coronal plane, checking with an alignment rod.    Once this was done, the posterior femoral referencing femoral sizer was placed under to the posterior condyles with 3 degrees of external rotational which was parallel to the transepicondylar axis and perpendicular to [employer]  line. The femur was sized to be a size 9 in the anterior-  posterior dimension. The  anterior, posterior, and  chamfer cuts were made without difficulty nor   notching making certain that I was along the anterior cortex to help  with  flexion gap stability. Next a laminar spreader was placed with the knee in flexion and the medial lateral menisci were resected.  5 cc of the Exparel  mixture was injected in the medial side of the back of the knee and 3 cc in the lateral side.  1/2 inch curved osteotome was used to resect posterior osteophyte that was then removed with a pituitary rongeur.       At this point, the tibia was sized to be a size F.  The size F tray was  then pinned in position. Trial reduction was now carried with a 9 femur, F tibia, a 10 mm MC insert.  There was some laxity improved with the 11mm spacer. The knee had full extension and was stable to varus valgus stress in extension.  The knee was slightly tight in flexion and the PCL was partially released.   Attention was next directed to the patella.  Precut  measurement was noted to be 25 mm.  I resected down to 15 mm and used a  32mm patellar button to restore patellar height as well as cover the cut surface.     The patella lug holes were drilled and a 32 mm patella poly trial was placed.    The knee was brought to full extension with good flexion stability with the patella tracking through the trochlea without application of pressure.    Next the femoral component was again assessed and determined to be seated and appropriately lateralized.  The femoral lug holes were drilled.  The femoral component was then removed. Tibial component was again assessed and felt to be seated and appropriately rotated with the medial third of the tubercle. The tibia was then drilled, and keel punched.     Final components were  opened and impacted into place.   The knee was irrigated with sterile Betadine  diluted in saline as well as pulse lavage normal saline. The synovial lining was  then injected a dilute Exparel  with 30cc of 0.25% marcaine  with epinephrine .     I confirmed that I was satisfied with the range of motion and stability, and the final 11 mm MC poly insert was  chosen.  It was placed into the knee.         The tourniquet had been let down.  No significant hemostasis was required.  The medial parapatellar arthrotomy was then reapproximated using #1 Stratafix sutures with the knee  in flexion.  The remaining wound was closed with 0 stratafix, 2-0 Vicryl, and running 3-0 Monocryl. The knee was cleaned, dried, dressed sterilely using Dermabond and   Aquacel dressing.  The patient was then brought to recovery room in stable condition, tolerating the procedure  well. There were no complications.   Post op recs: WB: WBAT Abx: ancef  Imaging: PACU xrays DVT prophylaxis: Xarelto  10 mg daily starting postop day 1 for 4 weeks given history of paroxysmal A-fib  follow up: 2 weeks after surgery for a wound check with Dr. Edna at [facility].  Address: [address], [place], [place] [address]  Office Phone: [phone]  Toribio Edna, MD Orthopaedic Surgery

## 2024-05-03 NOTE — Evaluation (Signed)
 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 Pt PMH includes but is not limited to: L LE pain and edema, chronic LBP, A-fib, HTN, arthritis, barrett's esophagus, and GERD.  Clinical Impression     LINCOLN GINLEY II is a 68 y.o. male POD 0 s/p R TKA. Patient reports mod I with mobility at baseline. Patient is now limited by functional impairments (see PT problem list below) and requires CGA and cues for transfers and gait with RW. Patient was able to ambulate 55 feet with RW and CGA and cues for safe walker management. Patient educated on safe sequencing for stair mobility, fall risk prevention, use of CP/ice, pain management and goal and car transfers pt and spouse verbalized understanding of safe guarding position for people assisting with mobility. Patient instructed in exercises to facilitate ROM and circulation reviewed and HO provided. Patient will benefit from continued skilled PT interventions to address impairments and progress towards PLOF. Patient has met mobility goals at adequate level for discharge home with family support and OPPT scheduled for 7/18; will continue to follow if pt continues acute stay to progress towards Mod I goals.       If plan is discharge home, recommend the following: A little help with walking and/or transfers;A little help with bathing/dressing/bathroom;Assistance with cooking/housework;Assist for transportation;Help with stairs or ramp for entrance   Can travel by private vehicle        Equipment Recommendations Rolling walker (2 wheels)  Recommendations for Other Services       Functional Status Assessment Patient has had a recent decline in their functional status and demonstrates the ability to make significant improvements in function in a  reasonable and predictable amount of time.     Precautions / Restrictions Precautions Precautions: Knee;Fall Restrictions Weight Bearing Restrictions Per Provider Order: No      Mobility  Bed Mobility Overal bed mobility: Needs Assistance Bed Mobility: Supine to Sit     Supine to sit: Supervision, HOB elevated     General bed mobility comments: min cues    Transfers Overall transfer level: Needs assistance Equipment used: Rolling walker (2 wheels) Transfers: Sit to/from Stand Sit to Stand: Contact guard assist           General transfer comment: min cues    Ambulation/Gait Ambulation/Gait assistance: Contact guard assist Gait Distance (Feet): 55 Feet Assistive device: Rolling walker (2 wheels) Gait Pattern/deviations: Step-to pattern, Decreased stance time - right, Antalgic, Trunk flexed Gait velocity: decreased     General Gait Details: slight trunk flexion and B UE support at Rw to offload R LE in stance phase, pt is able to perform step almost through pattern and demonstrates good sequencing with R LE steping forward first min cues for posture and RW management  Stairs Stairs: Yes Stairs assistance: Contact guard assist Stair Management: Two rails Number of Stairs: 2 General stair comments: min cues for sequencing and technique with step to pattern  Wheelchair Mobility     Tilt Bed    Modified Rankin (Stroke Patients Only)       Balance Overall balance assessment: Needs assistance Sitting-balance support: Feet supported Sitting balance-Leahy Scale: Good     Standing balance support: Bilateral upper extremity supported, During functional activity, Reliant on assistive device for balance Standing balance-Leahy Scale: Fair Standing balance  comment: static standing no UE support                             Pertinent Vitals/Pain Pain Assessment Pain Assessment: 0-10 Pain Score: 6  Pain Location: R LE and knee Pain Descriptors /  Indicators: Aching, Constant, Discomfort, Dull, Grimacing, Operative site guarding Pain Intervention(s): Limited activity within patient's tolerance, Monitored during session, Premedicated before session, Repositioned, Ice applied    Home Living Family/patient expects to be discharged to:: Private residence Living Arrangements: Spouse/significant other Available Help at Discharge: Family Type of Home: House Home Access: Stairs to enter Entrance Stairs-Rails: Right;Left;Can reach both Entrance Stairs-Number of Steps: 2   Home Layout: One level Home Equipment: Tub bench;Cane - single point      Prior Function Prior Level of Function : Independent/Modified Independent;Working/employed;Driving             Mobility Comments: IND  for all ADLs self care tasks and IADLs and use of SPC for ambulation due to pain       Extremity/Trunk Assessment        Lower Extremity Assessment Lower Extremity Assessment: RLE deficits/detail RLE Deficits / Details: ankle DF/PF 5/5; SLR < 10 degree lag RLE Sensation: WNL    Cervical / Trunk Assessment Cervical / Trunk Assessment: Normal  Communication   Communication Communication: No apparent difficulties    Cognition Arousal: Alert Behavior During Therapy: WFL for tasks assessed/performed   PT - Cognitive impairments: No apparent impairments                         Following commands: Intact       Cueing       General Comments      Exercises Total Joint Exercises Ankle Circles/Pumps: AROM, Both, 10 reps Quad Sets: AROM, Right, 5 reps Short Arc Quad: AROM, Right, 5 reps Heel Slides: AROM, Right, 5 reps Hip ABduction/ADduction: AROM, Right, 5 reps Straight Leg Raises: AROM, Right, 5 reps Knee Flexion: AROM, Right, 5 reps, Seated   Assessment/Plan    PT Assessment Patient needs continued PT services  PT Problem List Decreased strength;Decreased range of motion;Decreased activity tolerance;Decreased  balance;Decreased mobility;Decreased coordination;Pain       PT Treatment Interventions DME instruction;Gait training;Stair training;Functional mobility training;Therapeutic activities;Therapeutic exercise;Balance training;Neuromuscular re-education;Patient/family education;Modalities    PT Goals (Current goals can be found in the Care Plan section)  Acute Rehab PT Goals Patient Stated Goal: to be able to walk further and without pain PT Goal Formulation: With patient Time For Goal Achievement: 05/17/24 Potential to Achieve Goals: Good    Frequency 7X/week     Co-evaluation               AM-PAC PT 6 Clicks Mobility  Outcome Measure Help needed turning from your back to your side while in a flat bed without using bedrails?: None Help needed moving from lying on your back to sitting on the side of a flat bed without using bedrails?: A Little Help needed moving to and from a bed to a chair (including a wheelchair)?: A Little Help needed standing up from a chair using your arms (e.g., wheelchair or bedside chair)?: A Little Help needed to walk in hospital room?: A Little Help needed climbing 3-5 steps with a railing? : A Little 6 Click Score: 19    End of Session Equipment Utilized During Treatment: Gait belt Activity Tolerance: Patient tolerated treatment well;No  increased pain Patient left: in chair;with call bell/phone within reach;with family/visitor present Nurse Communication: Mobility status;Other (comment) (pt readiness from PT perspective for same day d/c) PT Visit Diagnosis: Unsteadiness on feet (R26.81);Other abnormalities of gait and mobility (R26.89);Muscle weakness (generalized) (M62.81);Difficulty in walking, not elsewhere classified (R26.2);Pain Pain - Right/Left: Right Pain - part of body: Knee;Leg    Time: 8670-8587 PT Time Calculation (min) (ACUTE ONLY): 43 min   Charges:   PT Evaluation $PT Eval Low Complexity: 1 Low PT Treatments $Gait Training:  8-22 mins $Therapeutic Exercise: 8-22 mins PT General Charges $$ ACUTE PT VISIT: 1 Visit         [place], PT Acute Rehab   [place] VEAR Drone 05/03/2024, 2:36 PM

## 2024-05-04 ENCOUNTER — Encounter (HOSPITAL_COMMUNITY): Payer: Self-pay | Admitting: Orthopedic Surgery

## 2024-05-18 ENCOUNTER — Other Ambulatory Visit: Payer: Self-pay

## 2024-05-18 MED ORDER — DILTIAZEM HCL ER COATED BEADS 300 MG PO CP24: 300.0000 mg | ORAL_CAPSULE | Freq: Every day | ORAL | 2 refills | Status: AC

## 2024-05-29 ENCOUNTER — Other Ambulatory Visit: Payer: Self-pay | Admitting: *Deleted

## 2024-05-29 MED ORDER — HYDRALAZINE HCL 25 MG PO TABS: 25.0000 mg | ORAL_TABLET | Freq: Three times a day (TID) | ORAL | 3 refills | Status: AC

## 2024-06-05 ENCOUNTER — Other Ambulatory Visit: Payer: Self-pay | Admitting: Cardiology

## 2024-06-06 MED ORDER — FLECAINIDE ACETATE 100 MG PO TABS: 100.0000 mg | ORAL_TABLET | Freq: Two times a day (BID) | ORAL | 3 refills | Status: AC

## 2024-06-26 ENCOUNTER — Ambulatory Visit: Admitting: Podiatry

## 2024-07-18 ENCOUNTER — Ambulatory Visit (INDEPENDENT_AMBULATORY_CARE_PROVIDER_SITE_OTHER): Admitting: Podiatry

## 2024-07-18 VITALS — Ht 70.0 in | Wt 225.0 lb

## 2024-07-18 DIAGNOSIS — B351 Tinea unguium: Secondary | ICD-10-CM

## 2024-07-18 NOTE — Progress Notes (Signed)
  Subjective:  Patient ID: Scott Townsend, male    DOB: 16-Oct-1956,  MRN: [id number]  Chief Complaint  Patient presents with   Nail Problem    Rm 1 Pt is here for f/u on onychomycosis of thel eft hallux nail. Nail is thickened, but discoloration has improved.    Discussed the use of AI scribe software for clinical note transcription with the patient, who gave verbal consent to proceed.  History of Present Illness He returns for follow-up he completed the Lamisil  course without complication notes improvement in the nail his cardiologist advised him not to take further Lamisil  after this round due to interactions with flecainide       Objective:    Physical Exam VASCULAR: DP and PT pulses palpable. Foot is warm and well-perfused. Capillary fill time is brisk. Venous insufficiency with varicose veins. DERMATOLOGIC: Normal skin turgor, texture, and temperature. No open lesions, rashes, or ulcerations. Onychomycosis of the left hallux nail with dystrophy.,  Improvement in appearance NEUROLOGIC: Normal sensation to light touch and pressure. No paresthesias. ORTHOPEDIC: Smooth, pain-free range of motion of all examined joints. No ecchymosis or bruising. No gross deformity. No pain to palpation.       Results    Assessment:   1. Onychomycosis      Plan:  Patient was evaluated and treated and all questions answered.  Assessment and Plan Assessment & Plan Onychomycosis of left hallux nail Showing improvement new photographs taken today has proximal clearing and some residual dystrophy but I do not think he needs further antifungal therapy at this point I would like to allow this to continue to heal and grow out further.  Expect that fluconazole would have a similar interaction possible that Sporanox may be an option if we need further oral therapy, he will follow with me in 3 months      Return in about 3 months (around 10/17/2024) for follow up after nail fungus treatment.

## 2024-07-27 ENCOUNTER — Ambulatory Visit: Admitting: Podiatry

## 2024-08-21 ENCOUNTER — Encounter: Payer: Self-pay | Admitting: Radiology

## 2024-08-30 ENCOUNTER — Other Ambulatory Visit

## 2024-08-30 ENCOUNTER — Ambulatory Visit: Payer: Self-pay | Admitting: Nurse Practitioner

## 2024-08-30 DIAGNOSIS — I10 Essential (primary) hypertension: Secondary | ICD-10-CM

## 2024-08-30 DIAGNOSIS — R7303 Prediabetes: Secondary | ICD-10-CM | POA: Diagnosis not present

## 2024-08-30 DIAGNOSIS — R351 Nocturia: Secondary | ICD-10-CM

## 2024-08-30 LAB — CBC WITH DIFFERENTIAL/PLATELET
Basophils Absolute: 0.1 K/uL (ref 0.0–0.1)
Basophils Relative: 0.7 % (ref 0.0–3.0)
Eosinophils Absolute: 0.2 K/uL (ref 0.0–0.7)
Eosinophils Relative: 2.1 % (ref 0.0–5.0)
HCT: 40.6 % (ref 39.0–52.0)
Hemoglobin: 14 g/dL (ref 13.0–17.0)
Lymphocytes Relative: 18.7 % (ref 12.0–46.0)
Lymphs Abs: 1.5 K/uL (ref 0.7–4.0)
MCHC: 34.6 g/dL (ref 30.0–36.0)
MCV: 92.5 fl (ref 78.0–100.0)
Monocytes Absolute: 1 K/uL (ref 0.1–1.0)
Monocytes Relative: 12.7 % — ABNORMAL HIGH (ref 3.0–12.0)
Neutro Abs: 5.2 K/uL (ref 1.4–7.7)
Neutrophils Relative %: 65.8 % (ref 43.0–77.0)
Platelets: 233 K/uL (ref 150.0–400.0)
RBC: 4.39 Mil/uL (ref 4.22–5.81)
RDW: 13.6 % (ref 11.5–15.5)
WBC: 7.9 K/uL (ref 4.0–10.5)

## 2024-08-30 LAB — LIPID PANEL
Cholesterol: 139 mg/dL (ref 0–200)
HDL: 53.8 mg/dL (ref >39.00)
LDL Cholesterol: 77 mg/dL (ref 0–99)
NonHDL: 84.98
Total CHOL/HDL Ratio: 3
Triglycerides: 38 mg/dL (ref 0.0–149.0)
VLDL: 7.6 mg/dL (ref 0.0–40.0)

## 2024-08-30 LAB — COMPREHENSIVE METABOLIC PANEL WITH GFR
ALT: 17 U/L (ref 0–53)
AST: 14 U/L (ref 0–37)
Albumin: 4.3 g/dL (ref 3.5–5.2)
Alkaline Phosphatase: 69 U/L (ref 39–117)
BUN: 11 mg/dL (ref 6–23)
CO2: 27 meq/L (ref 19–32)
Calcium: 9.1 mg/dL (ref 8.4–10.5)
Chloride: 104 meq/L (ref 96–112)
Creatinine, Ser: 1.02 mg/dL (ref 0.40–1.50)
GFR: 75.73 mL/min (ref >60.00)
Glucose, Bld: 91 mg/dL (ref 70–99)
Potassium: 3.7 meq/L (ref 3.5–5.1)
Sodium: 139 meq/L (ref 135–145)
Total Bilirubin: 0.5 mg/dL (ref 0.2–1.2)
Total Protein: 6.3 g/dL (ref 6.0–8.3)

## 2024-08-30 LAB — PSA: PSA: 1.51 ng/mL (ref 0.10–4.00)

## 2024-08-30 LAB — HEMOGLOBIN A1C: Hgb A1c MFr Bld: 5.5 % (ref 4.6–6.5)

## 2024-08-31 ENCOUNTER — Other Ambulatory Visit: Payer: Self-pay | Admitting: Medical Genetics

## 2024-09-06 ENCOUNTER — Encounter: Payer: Self-pay | Admitting: Nurse Practitioner

## 2024-09-06 ENCOUNTER — Ambulatory Visit: Admitting: Nurse Practitioner

## 2024-09-06 VITALS — BP 134/80 | HR 75 | Temp 97.1°F | Ht 70.0 in | Wt 229.0 lb

## 2024-09-06 DIAGNOSIS — M5442 Lumbago with sciatica, left side: Secondary | ICD-10-CM

## 2024-09-06 DIAGNOSIS — R7303 Prediabetes: Secondary | ICD-10-CM

## 2024-09-06 DIAGNOSIS — I10 Essential (primary) hypertension: Secondary | ICD-10-CM | POA: Diagnosis not present

## 2024-09-06 DIAGNOSIS — H9312 Tinnitus, left ear: Secondary | ICD-10-CM | POA: Diagnosis not present

## 2024-09-06 DIAGNOSIS — M25552 Pain in left hip: Secondary | ICD-10-CM

## 2024-09-06 DIAGNOSIS — G8929 Other chronic pain: Secondary | ICD-10-CM

## 2024-09-06 DIAGNOSIS — M1711 Unilateral primary osteoarthritis, right knee: Secondary | ICD-10-CM | POA: Diagnosis not present

## 2024-09-06 DIAGNOSIS — Z006 Encounter for examination for normal comparison and control in clinical research program: Secondary | ICD-10-CM

## 2024-09-06 NOTE — Progress Notes (Signed)
 Established Patient Office Visit  Subjective   Patient ID: Scott Townsend, male    DOB: 1956/04/21  Age: 68 y.o. MRN: [id number]  Chief Complaint  Patient presents with   Hypertension    Follow up, concerns with left ear ringing    HPI  Discussed the use of AI scribe software for clinical note transcription with the patient, who gave verbal consent to proceed.  History of Present Illness   Scott Townsend is a 68 year old male who presents with tinnitus in the left ear.  He experiences a persistent and worsening ringing in the left ear, which started after recent surgery. The tinnitus causes significant distress but is not associated with pain or significant hearing loss, although it makes hearing difficult. A white noise machine is used to aid sleep, as the ringing is more noticeable in quiet environments.  He recently underwent knee replacement surgery and completed six weeks of physical therapy, progressing from a walker to walking unaided. He experiences some residual discomfort and swelling post-surgery, with limited mobility in the left hip.       ROS See pertinent positives and negatives per HPI.    Objective:     BP 134/80 (BP Location: Left Arm, Patient Position: Sitting, Cuff Size: Normal)   Pulse 75   Temp (!) 97.1 F (36.2 C)   Ht 5' 10 (1.778 m)   Wt 229 lb (103.9 kg)   SpO2 96%   BMI 32.86 kg/m  BP Readings from Last 3 Encounters:  09/06/24 134/80  05/03/24 (!) 150/76  04/20/24 (!) 132/58   Wt Readings from Last 3 Encounters:  09/06/24 229 lb (103.9 kg)  07/18/24 225 lb (102.1 kg)  05/03/24 225 lb (102.1 kg)      Physical Exam Vitals and nursing note reviewed.  Constitutional:      Appearance: Normal appearance.  HENT:     Head: Normocephalic.     Right Ear: Tympanic membrane, ear canal and external ear normal.     Left Ear: Tympanic membrane, ear canal and external ear normal.  Eyes:     Conjunctiva/sclera: Conjunctivae  normal.  Cardiovascular:     Rate and Rhythm: Normal rate and regular rhythm.     Pulses: Normal pulses.     Heart sounds: Normal heart sounds.  Pulmonary:     Effort: Pulmonary effort is normal.     Breath sounds: Normal breath sounds.  Musculoskeletal:     Cervical back: Normal range of motion.  Skin:    General: Skin is warm.  Neurological:     General: No focal deficit present.     Mental Status: He is alert and oriented to person, place, and time.  Psychiatric:        Mood and Affect: Mood normal.        Behavior: Behavior normal.        Thought Content: Thought content normal.        Judgment: Judgment normal.    The 10-year ASCVD risk score (Arnett DK, et al., 2019) is: 15.5%    Assessment & Plan:   Problem List Items Addressed This Visit       Cardiovascular and Mediastinum   Essential hypertension   Chronic, stable. Continue hydralazine  25mg  TID, and ramipril  10mg  daily. Recent CMP, CBC, and lipid panel reviewed.         Musculoskeletal and Integument   Primary osteoarthritis of right knee   Post-surgical recovery is progressing well.  He has transitioned from using a walker to a cane and now to independent ambulation. Mild discomfort and swelling are noted. Continue collaboration and recommendations from ortho.         Other   Chronic low back pain   Chronic left hip and back pain with sciatica persists. Pain is managed with Tylenol  and ibuprofen . Continue collaboration and recommendations from ortho.       Prediabetes   Chronic, stable. A1c 5.5%. Continue focusing on nutrition and exercise.      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 Pain is managed with Tylenol  and ibuprofen . Continue collaboration and recommendations from ortho.       Tinnitus of left ear - Primary   Persistent tinnitus remains post-surgery without pain or hearing loss. No infection or fluid is present. He is referred to an audiologist for  further evaluation, advised to use background or white noise, and recommended to take a lipoflavonoid supplement.      Relevant Orders   Ambulatory referral to Audiology   Other Visit Diagnoses       Research study patient       Relevant Orders   [facility] Molecular Screen - Blood      Return in about 6 months (around 03/06/2025) for HTN.    Tinnie DELENA Harada, NP

## 2024-09-06 NOTE — Patient Instructions (Signed)
 It was great to see you!  Lipo flavinoid supplement can help with ear ringing   I am also placing a referral to an audiologisit   Continue white noise or background noise and avoid quiet   Keep up the great work!   Let's follow-up in 6 months, sooner if you have concerns.  If a referral was placed today, you will be contacted for an appointment. Please note that routine referrals can sometimes take up to 3-4 weeks to process. Please call our office if you haven't heard anything after this time frame.  Take care,  Tinnie Harada, NP

## 2024-09-08 DIAGNOSIS — H9312 Tinnitus, left ear: Secondary | ICD-10-CM | POA: Insufficient documentation

## 2024-09-08 NOTE — Assessment & Plan Note (Signed)
 Persistent tinnitus remains post-surgery without pain or hearing loss. No infection or fluid is present. He is referred to an audiologist for further evaluation, advised to use background or white noise, and recommended to take a lipoflavonoid supplement.

## 2024-09-08 NOTE — Assessment & Plan Note (Signed)
 Chronic left hip and back pain with sciatica persists. Pain is managed with Tylenol  and ibuprofen . Continue collaboration and recommendations from ortho.

## 2024-09-08 NOTE — Assessment & Plan Note (Signed)
 Chronic, stable. A1c 5.5%. Continue focusing on nutrition and exercise.

## 2024-09-08 NOTE — Assessment & Plan Note (Signed)
 Post-surgical recovery is progressing well. He has transitioned from using a walker to a cane and now to independent ambulation. Mild discomfort and swelling are noted. Continue collaboration and recommendations from ortho.

## 2024-09-08 NOTE — Assessment & Plan Note (Signed)
 Chronic, stable. Continue hydralazine 25mg  TID, and ramipril 10mg  daily. Recent CMP, CBC, and lipid panel reviewed.

## 2024-09-18 LAB — GENECONNECT MOLECULAR SCREEN: Genetic Analysis Overall Interpretation: NEGATIVE

## 2024-09-27 ENCOUNTER — Encounter: Payer: Self-pay | Admitting: Nurse Practitioner

## 2024-09-27 ENCOUNTER — Ambulatory Visit: Payer: Self-pay | Admitting: Nurse Practitioner

## 2024-09-27 ENCOUNTER — Ambulatory Visit: Admitting: Audiologist

## 2024-09-27 DIAGNOSIS — H9193 Unspecified hearing loss, bilateral: Secondary | ICD-10-CM | POA: Insufficient documentation

## 2024-09-27 DIAGNOSIS — H9312 Tinnitus, left ear: Secondary | ICD-10-CM

## 2024-09-27 NOTE — Procedures (Signed)
°  [facility] [address] [place], [place]  [address] [phone]  AUDIOLOGICAL  EVALUATION  NAME: Scott Townsend     DOB:   31-Jan-1956      MRN: [id number]                                                                                     DATE: 09/27/2024     REFERENT: Nedra Tinnie LABOR, NP STATUS: Outpatient DIAGNOSIS: Tinnitus, Decreased Hearing    History: Scott Townsend was seen for an audiological evaluation due to a loud tinnitus in the left ear only that has been getting louder the last few months. If he pays attention to the tinnitus it gets louder. Scott Townsend had knee replacement surgery around the time the tinnitus started getting worse. He denies any difficulty hearing. The tinnitus is a high pitched constant tone. It started many years ago, maybe when he was in the [employer]. He was exposed to lots of explosives and firearms. He was a right handed shooter. Scott Townsend denies pain or pressure in either ear. Medical history shows no additional risk for hearing loss.   Evaluation:  Otoscopy showed a clear view of the tympanic membranes, bilaterally Tympanometry results were consistent with normal middle ear function, bilaterally   Audiometric testing was completed using Conventional Audiometry techniques with insert earphones and supraural headphones. Test results are consistent with normal to sensorineural mild loss bilaterally. Slight 15dB asymmetry at [facility] with left ear worse. Speech Recognition Thresholds were obtained at  25dB HL in the right ear and at 25dB HL in the left ear. Word Recognition Testing was completed at  40dB SL and Scott Townsend scored 100% in each ear at 65dB masked.   Tinnitus pitch and loudness matched to 10kHz at 44dB, rated a 8-9 on scale of 1-10 with perfect match a 10. Tinnitus positive for residual inhibition with 30 seconds of 500Hz  NBN.   Results:  The test results were reviewed with Scott Townsend. 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 His tinnitus is a 10kHz tone. This decrease in his tinnitus with the low pitched noise is a good indicator that he will benefit from masking. Tinnitus packet and results all reviewed with Scott Townsend printed and provided to Scott Townsend.    Recommendations: Masking with ear levels devices reviewed with Scott Townsend. Tinnitus management packet reviewed and provided to [facility]. Follow recommendations for masking.  Annual audiometric testing recommended to monitor hearing loss for progression.  Recommend referral to Otolaryngology due to unilateral tinnitus   38 minutes spent testing and counseling on results.   If you have any questions please feel free to contact me at [phone].  Scott Townsend Au.D.  Audiologist   09/27/2024  9:17 AM  Cc: Nedra Tinnie LABOR, NP

## 2024-09-28 ENCOUNTER — Other Ambulatory Visit

## 2024-09-28 ENCOUNTER — Encounter (INDEPENDENT_AMBULATORY_CARE_PROVIDER_SITE_OTHER): Payer: Self-pay

## 2024-10-17 ENCOUNTER — Ambulatory Visit: Admitting: Podiatry

## 2024-10-24 ENCOUNTER — Institutional Professional Consult (permissible substitution) (INDEPENDENT_AMBULATORY_CARE_PROVIDER_SITE_OTHER): Admitting: Physician Assistant

## 2024-10-30 ENCOUNTER — Encounter (INDEPENDENT_AMBULATORY_CARE_PROVIDER_SITE_OTHER): Payer: Self-pay | Admitting: Physician Assistant

## 2024-10-30 ENCOUNTER — Ambulatory Visit (INDEPENDENT_AMBULATORY_CARE_PROVIDER_SITE_OTHER): Admitting: Physician Assistant

## 2024-10-30 VITALS — BP 172/82 | HR 70 | Ht 70.0 in | Wt 225.0 lb

## 2024-10-30 DIAGNOSIS — J342 Deviated nasal septum: Secondary | ICD-10-CM

## 2024-10-30 DIAGNOSIS — H9312 Tinnitus, left ear: Secondary | ICD-10-CM | POA: Diagnosis not present

## 2024-10-30 NOTE — Patient Instructions (Addendum)
 [facility] Tinnitus  [address] [place], [place] [address] Phone: [phone] Fax: [phone] Forms: [email or web address]    I have ordered an imaging study for you to complete prior to your next visit. Please call [facility] Scheduling at [phone] to schedule your imaging if you have not received a call within 24 hours. If you are unable to complete your imaging study prior to your next scheduled visit please call our office to let us  know.

## 2024-10-30 NOTE — Progress Notes (Signed)
 Patient having BP managed. Patient has not taken second medication this morning.

## 2024-10-30 NOTE — Progress Notes (Signed)
 Dear Dr. Nedra, Here is my assessment for our mutual patient, Scott Townsend. Thank you for allowing me the opportunity to care for your patient. Please do not hesitate to contact me should you have any other questions. Sincerely, Chyrl Cohen PA-C  Otolaryngology Clinic Note Referring provider: Dr. Nedra HPI:  Scott Townsend is a 69 y.o. male kindly referred by Dr. Nedra   Discussed the use of AI scribe software for clinical note transcription with the patient, who gave verbal consent to proceed.  History of Present Illness     Scott Townsend is a 69 year old male who presents for evaluation of worsening left-sided tinnitus and chronic nasal obstruction.  He has experienced left-sided tinnitus for several years, with recent worsening that significantly impairs his quality of life. The tinnitus is described as booming and is only present in the left ear. Audiologic evaluation included tone matching and exposure to various masking noises (pink, brown, white noise), which provided partial but incomplete relief. He has not used hearing aids or masking devices. He denies otalgia, fever, numbness, tingling, weakness, slurred speech, or recurrent otitis media.  He experiences frequent episodes of dizziness, which have worsened over time. These episodes are characterized by sudden blurriness and unsteadiness, sometimes requiring him to sit or lie down. Dizziness is often triggered by positional changes such as moving from sitting to standing, but not by head movement. He notes decreased balance compared to baseline and denies associated neurologic deficits.  He has longstanding nasal obstruction, predominantly on the left side, for over twenty years. He is a habitual mouth breather, especially at night, and finds the obstruction bothersome, sometimes feeling unable to breathe when supine. He has sustained multiple nasal fractures, most recently during a softball game. Nasal  congestion is persistent and worse on the left, with minimal airflow through the left nostril. He denies recurrent sinus infections and significant allergies, though he experiences occasional sneezing and rhinorrhea that partially respond to antihistamines.           Independent Review of Additional Tests or Records:  Audiological evaluation 09/27/2024 Tympanometry results were consistent with normal middle ear function, bilaterally Audiometric testing was completed using Conventional Audiometry techniques with insert earphones and supraural headphones. Test results are consistent with normal to sensorineural mild loss bilaterally. Slight 15dB asymmetry at [facility] with left ear worse. Speech Recognition Thresholds were obtained at 25dB HL in the right ear and at 25dB HL in the left ear. Word Recognition Testing was completed at 40dB SL and Dollie scored 100% in each ear at 65dB masked. Tinnitus pitch and loudness matched to 10kHz at 44dB, rated a 8-9 on scale of 1-10 with perfect match a 10. Tinnitus positive for residual inhibition with 30 seconds of 500Hz  NBN.  Results: The test results were reviewed with Elsie. 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 His tinnitus is a 10kHz tone. This decrease in his tinnitus with the low pitched noise is a good indicator that he will benefit from masking. Tinnitus packet and results all reviewed with Maurine Sos printed and provided to Elsie.   PMH/Meds/All/SocHx/FamHx/ROS:   Past Medical History:  Diagnosis Date   Arthritis    Atrial fibrillation (HCC)    Barrett esophagus    Dysrhythmia    A.fib  dx in 2013   GERD (gastroesophageal reflux disease)    Hypertension    Pre-diabetes      Past Surgical  History:  Procedure Laterality Date   CARDIOVERSION  2013   x 3  by Dr. Ladona   TOTAL KNEE ARTHROPLASTY Right 05/03/2024   Procedure: ARTHROPLASTY, KNEE, TOTAL;  Surgeon:  Edna Toribio LABOR, MD;  Location: [facility];  Service: Orthopedics;  Laterality: Right;    Family History  Problem Relation Age of Onset   Deep vein thrombosis Mother    Varicose Veins Mother    Alcohol  abuse Father    Heart attack Father 42   Stroke Father 39     Social Connections: Moderately Integrated (09/02/2024)   Social Connection and Isolation Panel    Frequency of Communication with Friends and Family: Three times a week    Frequency of Social Gatherings with Friends and Family: Once a week    Attends Religious Services: More than 4 times per year    Active Member of [employer] or Organizations: No    Attends Engineer, Structural: Not on file    Marital Status: Married     Current Medications[1]   Physical Exam:   BP (!) 172/82   Pulse 70   Ht 5' 10 (1.778 m)   Wt 225 lb (102.1 kg)   SpO2 95%   BMI 32.28 kg/m   Pertinent Findings  CN II-XII grossly intact Bilateral EAC clear and TM intact with well pneumatized middle ear spaces Anterior rhinoscopy: Septum anterior left deviation posterior right deviation; bilateral inferior turbinates with moderate hypertrophy No lesions of oral cavity/oropharynx No obviously palpable neck masses/lymphadenopathy/thyromegaly No respiratory distress or stridor       Seprately Identifiable Procedures:  None  Impression & Plans:  [name] is a 69 y.o. male with the following   Assessment and Plan    Deviated nasal septum Chronic severe deviation causing significant left-sided nasal obstruction affecting sleep and quality of life.  Septoplasty may be a consideration, would like to see CT sinus, I will discuss her results with one of our physicians.   - Ordered CT of the sinuses to evaluate anatomy and assess septoplasty candidacy. - Planned to review CT results and discuss findings with a surgeon regarding possible septoplasty. - Arranged follow-up for nasal endoscopy to assess surgical complexity if  indicated.  Left-sided tinnitus Chronic left-sided tinnitus with recent worsening. Audiologic evaluation showed partial improvement with sound masking.  Minimal asymmetry no indication for immediate imaging, I would like him to repeat audio in 1 year, sooner if new symptoms arise. - Provided education on sound masking strategies and recommended use of noise machines or phone apps, especially at night. - Referred to [facility] for specialized evaluation and management, including consideration of hearing aids or masking devices. - Recommended annual hearing test to monitor for progression of asymmetric hearing loss. - Instructed him to report new symptoms such as otalgia, fever, numbness, tingling, or weakness in the affected ear.           - f/u phone discussion with CT results   Thank you for allowing me the opportunity to care for your patient. Please do not hesitate to contact me should you have any other questions.  Sincerely, Chyrl Cohen PA-C [facility] Phone: [phone] Fax: [phone]  10/30/2024, 2:14 PM        [1]  Current Outpatient Medications:    diltiazem  (CARDIZEM  CD) 300 MG 24 hr capsule, Take 1 capsule (300 mg total) by mouth daily., Disp: 90 capsule, Rfl: 2   diphenhydramine-acetaminophen  (TYLENOL  PM) 25-500 MG TABS, Take 1 tablet  by mouth at bedtime., Disp: , Rfl:    flecainide  (TAMBOCOR ) 100 MG tablet, Take 1 tablet (100 mg total) by mouth 2 (two) times daily., Disp: 180 tablet, Rfl: 3   hydrALAZINE  (APRESOLINE ) 25 MG tablet, Take 1 tablet (25 mg total) by mouth 3 (three) times daily., Disp: 270 tablet, Rfl: 3   lansoprazole (PREVACID) 30 MG capsule, Take 30 mg by mouth daily., Disp: , Rfl:    polyethylene glycol (MIRALAX ) 17 g packet, Take 17 g by mouth daily. (Patient not taking: Reported on 09/06/2024), Disp: 14 each, Rfl: 0   ramipril  (ALTACE ) 10 MG capsule, TAKE 1 CAPSULE BY MOUTH 2 TIMES A DAY, Disp: 180 capsule, Rfl: 2    rivaroxaban  (XARELTO ) 10 MG TABS tablet, Take 1 tablet (10 mg total) by mouth daily. (Patient not taking: Reported on 09/06/2024), Disp: 28 tablet, Rfl: 0

## 2024-10-31 ENCOUNTER — Ambulatory Visit

## 2024-11-03 ENCOUNTER — Ambulatory Visit (HOSPITAL_COMMUNITY)
Admission: RE | Admit: 2024-11-03 | Discharge: 2024-11-03 | Disposition: A | Discharge status: Home / Self Care | Source: Ambulatory Visit | Attending: Physician Assistant | Admitting: Physician Assistant

## 2024-11-03 DIAGNOSIS — J342 Deviated nasal septum: Secondary | ICD-10-CM | POA: Insufficient documentation

## 2024-11-06 ENCOUNTER — Encounter (INDEPENDENT_AMBULATORY_CARE_PROVIDER_SITE_OTHER): Payer: Self-pay

## 2024-11-08 NOTE — Telephone Encounter (Signed)
 Ok great, once the results are complete I will give him a call. It can take up to 2 weeks

## 2024-11-09 ENCOUNTER — Telehealth (INDEPENDENT_AMBULATORY_CARE_PROVIDER_SITE_OTHER): Payer: Self-pay | Admitting: Physician Assistant

## 2024-11-09 NOTE — Telephone Encounter (Signed)
 Thanks I spoke with him. His CT showed deviated septum, Dr. Greggory would like to see him in the office to evaluate and see if he is a surgical candidate. Can we schedule him with her? Thanks

## 2024-11-09 NOTE — Telephone Encounter (Signed)
 Patient called back returning PA Hedges phone call. I let the patient know that PA Hedges was in with a patient but I would let him know that he called and that PA Hedges would give him a call back when he got a chance. Patient understood.

## 2024-11-09 NOTE — Telephone Encounter (Signed)
 Left voicemail to discuss results, LVM.

## 2024-11-10 ENCOUNTER — Ambulatory Visit (INDEPENDENT_AMBULATORY_CARE_PROVIDER_SITE_OTHER)

## 2024-11-10 ENCOUNTER — Encounter (INDEPENDENT_AMBULATORY_CARE_PROVIDER_SITE_OTHER): Payer: Self-pay

## 2024-11-10 VITALS — BP 171/77 | HR 74 | Ht 70.0 in | Wt 225.0 lb

## 2024-11-10 DIAGNOSIS — J342 Deviated nasal septum: Secondary | ICD-10-CM

## 2024-11-10 DIAGNOSIS — J343 Hypertrophy of nasal turbinates: Secondary | ICD-10-CM

## 2024-11-10 DIAGNOSIS — J3489 Other specified disorders of nose and nasal sinuses: Secondary | ICD-10-CM | POA: Diagnosis not present

## 2024-11-10 NOTE — Progress Notes (Signed)
 Dear Dr. Nedra, Here is my assessment for our mutual patient, Scott Townsend. Thank you for allowing me the opportunity to care for your patient. Please do not hesitate to contact me should you have any other questions. Sincerely, Dr. Hadassah Parody  Otolaryngology Clinic Note Referring provider: Dr. Nedra HPI:   Initial HPI (11/10/2024)  Pt has longstanding nasal obstruction, predominantly on the left side, for over twenty years. He is a habitual mouth breather, especially at night, and finds the obstruction bothersome, sometimes feeling unable to breathe when supine. He has sustained multiple nasal fractures, most recently during a softball game. Nasal congestion is persistent and worse on the left, with minimal airflow through the left nostril. He denies recurrent sinus infections and significant allergies, though he experiences occasional sneezing and rhinorrhea that partially respond to antihistamines.   He has used Flonase without any benefit or change in his nasal obstruction.  He has a atrial fibrillation, not currently on blood thinners.  Has a cardiologist Dr. Lavona.       Independent Review of Additional Tests or Records:  CT sinus 11/03/2024 independently reviewed showing no evidence of sinus disease but significant leftward nasal septal deviation anteriorly, bilateral inferior turbinate hypertrophy   PMH/Meds/All/SocHx/FamHx/ROS:   Past Medical History:  Diagnosis Date   Arthritis    Atrial fibrillation (HCC)    Barrett esophagus    Dysrhythmia    A.fib  dx in 2013   GERD (gastroesophageal reflux disease)    Hypertension    Pre-diabetes      Past Surgical History:  Procedure Laterality Date   CARDIOVERSION  2013   x 3  by Dr. Ladona   TOTAL KNEE ARTHROPLASTY Right 05/03/2024   Procedure: ARTHROPLASTY, KNEE, TOTAL;  Surgeon: Edna Toribio LABOR, MD;  Location: [facility];  Service: Orthopedics;  Laterality: Right;    Family History  Problem Relation Age of  Onset   Deep vein thrombosis Mother    Varicose Veins Mother    Alcohol  abuse Father    Heart attack Father 26   Stroke Father 17     Social Connections: Moderately Integrated (09/02/2024)   Social Connection and Isolation Panel    Frequency of Communication with Friends and Family: Three times a week    Frequency of Social Gatherings with Friends and Family: Once a week    Attends Religious Services: More than 4 times per year    Active Member of [employer] or Organizations: No    Attends Engineer, Structural: Not on file    Marital Status: Married     Current Outpatient Medications  Medication Instructions   diltiazem  (CARDIZEM  CD) 300 mg, Oral, Daily   diphenhydramine-acetaminophen  (TYLENOL  PM) 25-500 MG TABS 1 tablet, Daily at bedtime   flecainide  (TAMBOCOR ) 100 mg, Oral, 2 times daily   hydrALAZINE  (APRESOLINE ) 25 mg, Oral, 3 times daily   lansoprazole (PREVACID) 30 mg, Daily   polyethylene glycol (MIRALAX ) 17 g, Oral, Daily   ramipril  (ALTACE ) 10 mg, Oral, 2 times daily   rivaroxaban  (XARELTO ) 10 mg, Oral, Daily     Physical Exam:   BP (!) 171/77 (BP Location: Right Wrist, Patient Position: Sitting, Cuff Size: Large)   Pulse 74   Ht 5' 10 (1.778 m)   Wt 225 lb (102.1 kg)   SpO2 94%   BMI 32.28 kg/m   Salient findings:  CN II-XII intact   Anterior rhinoscopy: Septum leftward deviated anteriorly with a leftward spur more posteriorly; bilateral inferior turbinates with hypertrophy Nasal  bones leftward deviated  No lesions of oral cavity/oropharynx No obviously palpable neck masses/lymphadenopathy/thyromegaly  No respiratory distress or stridor  Seprately Identifiable Procedures:  Prior to initiating any procedures, risks/benefits/alternatives were explained to the patient and verbal consent obtained. None  Impression & Plans:  Scott Townsend is a 69 y.o. male with     ICD-10-CM   1. Nasal obstruction  J34.89     2. Nasal septal deviation  J34.2      3. Hypertrophy of both inferior nasal turbinates  J34.3       Assessment and Plan Assessment & Plan Nasal obstruction due to deviated septum and turbinate hypertrophy Chronic, severe nasal obstruction secondary to marked leftward septal deviation and turbinate hypertrophy, resulting in near-complete anterior left nasal airway obstruction confirmed on imaging. Symptoms have progressively worsened over years following remote nasal trauma. Prior intranasal corticosteroids were ineffective and not tolerated. Surgical options discussed: standard septoplasty with turbinate reduction (no external incisions, preserves anterior septal support); open septorhinoplasty with nasal bone realignment and extracorporeal septal reconstruction by facial plastic surgery would likely offer even greater improvement of his nasal obstruction given his septum is deviated anteriorly but entails greater surgical risk, longer recovery.  He prefers the less invasive septoplasty with turbinate reduction, accepting the possibility of incomplete improvement.   We discussed R/B/A including pain, infection, bleeding (~5% risk of operative visit for control), persistent symptoms, need for revision surgery, and other risks including damage to surrounding structures, septal perforation, skull base injury which would be exceedingly rare. .  We discussed use of nasal saline spray and nasal saline irrigations post-operatively Patient understands and is ready to proceed.  - Ordered septoplasty with turbinate reduction. - Planned to contact cardiologist for surgical clearance - Dr. Cathye  - Instructed him to expect a call regarding surgery scheduling and location. - Advised to arrange for an adult to stay with him overnight postoperatively.    See below regarding exact medications prescribed this encounter including dosages and route: No orders of the defined types were placed in this encounter.   Thank you for allowing me the  opportunity to care for your patient. Please do not hesitate to contact me should you have any other questions.  Sincerely, Hadassah Parody, MD Otolaryngologist (ENT), [facility] Phone: [phone] Fax: [phone]  MDM:  Level 4 Complexity/Problems addressed: 4-chronic worsening problem Data complexity: 4-  independent review of CT scan - Morbidity: 4-discussion of surgery - Prescription Drug prescribed or managed: no

## 2024-11-14 ENCOUNTER — Ambulatory Visit: Admitting: Podiatry

## 2024-11-22 ENCOUNTER — Telehealth (INDEPENDENT_AMBULATORY_CARE_PROVIDER_SITE_OTHER): Payer: Self-pay

## 2024-11-22 ENCOUNTER — Telehealth: Payer: Self-pay | Admitting: Cardiology

## 2024-11-22 NOTE — Telephone Encounter (Signed)
 Called and spoke with patient's cardiology office regarding the patient's surgical clearance. The office stated they were sending another message to the provider and they would be faxing over the clearance soon.

## 2024-11-22 NOTE — Telephone Encounter (Signed)
"  ° °  Pre-operative Risk Assessment    Patient Name: Scott Townsend  DOB: Sep 20, 1956 MRN: [id number]   Date of last office visit: 03/07/24 Date of next office visit: 03/08/25    Request for Surgical Clearance    Procedure:  septoplasty  inferior turbinate reduction   Date of Surgery:  Clearance 12/11/24                                Surgeon:  Dr. Greggory  Surgeon's Group or Practice Name:  [facility]  Phone number:  [phone]  Fax number:  [phone]   Type of Clearance Requested:   - Medical    Type of Anesthesia:  General    Additional requests/questions:    SignedLarraine Salt   11/22/2024, 1:40 PM   "

## 2024-11-23 ENCOUNTER — Encounter (INDEPENDENT_AMBULATORY_CARE_PROVIDER_SITE_OTHER): Payer: Self-pay

## 2024-11-23 ENCOUNTER — Telehealth (HOSPITAL_BASED_OUTPATIENT_CLINIC_OR_DEPARTMENT_OTHER): Payer: Self-pay | Admitting: *Deleted

## 2024-11-23 NOTE — Telephone Encounter (Signed)
 Pt has been scheduled tele preop appt 12/01/24. Med rec and consent are done.     Patient Consent for Virtual Visit        Scott Townsend has provided verbal consent on 11/23/2024 for a virtual visit (video or telephone).   CONSENT FOR VIRTUAL VISIT FOR:  Scott Townsend  By participating in this virtual visit I agree to the following:  I hereby voluntarily request, consent and authorize [facility] and its employed or contracted physicians, physician assistants, nurse practitioners or other licensed health care professionals (the Practitioner), to provide me with telemedicine health care services (the Services) as deemed necessary by the treating Practitioner. I acknowledge and consent to receive the Services by the Practitioner via telemedicine. I understand that the telemedicine visit will involve communicating with the Practitioner through live audiovisual communication technology and the disclosure of certain medical information by electronic transmission. I acknowledge that I have been given the opportunity to request an in-person assessment or other available alternative prior to the telemedicine visit and am voluntarily participating in the telemedicine visit.  I understand that I have the right to withhold or withdraw my consent to the use of telemedicine in the course of my care at any time, without affecting my right to future care or treatment, and that the Practitioner or I may terminate the telemedicine visit at any time. I understand that I have the right to inspect all information obtained and/or recorded in the course of the telemedicine visit and may receive copies of available information for a reasonable fee.  I understand that some of the potential risks of receiving the Services via telemedicine include:  Delay or interruption in medical evaluation due to technological equipment failure or disruption; Information transmitted may not be sufficient (e.g. poor  resolution of images) to allow for appropriate medical decision making by the Practitioner; and/or  In rare instances, security protocols could fail, causing a breach of personal health information.  Furthermore, I acknowledge that it is my responsibility to provide information about my medical history, conditions and care that is complete and accurate to the best of my ability. I acknowledge that Practitioner's advice, recommendations, and/or decision may be based on factors not within their control, such as incomplete or inaccurate data provided by me or distortions of diagnostic images or specimens that may result from electronic transmissions. I understand that the practice of medicine is not an exact science and that Practitioner makes no warranties or guarantees regarding treatment outcomes. I acknowledge that a copy of this consent can be made available to me via my patient portal [facility] MyChart), or I can request a printed copy by calling the office of [facility].    I understand that my insurance will be billed for this visit.   I have read or had this consent read to me. I understand the contents of this consent, which adequately explains the benefits and risks of the Services being provided via telemedicine.  I have been provided ample opportunity to ask questions regarding this consent and the Services and have had my questions answered to my satisfaction. I give my informed consent for the services to be provided through the use of telemedicine in my medical care

## 2024-11-23 NOTE — Telephone Encounter (Signed)
" ° °  Name: Scott Townsend  DOB: [birth year]  MRN: [id number]  Primary Cardiologist: Lynwood Schilling, MD  Chart reviewed as part of pre-operative protocol coverage. Because of Scott Townsend's past medical history and time since last visit, he will require a follow-up telephone visit in order to better assess preoperative cardiovascular risk.  Pre-op covering staff: - Please schedule appointment and call patient to inform them. If patient already had an upcoming appointment within acceptable timeframe, please add pre-op clearance to the appointment notes so provider is aware. - Please contact requesting surgeon's office via preferred method (i.e, phone, fax) to inform them of need for appointment prior to surgery.  The patient is taking Xarelto .  This is not prescribed by cardiology.  Would recommend holding parameters coming from prescribing provider.  [name] LOISE Fabry, PA-C  11/23/2024, 8:23 AM   "

## 2024-11-23 NOTE — Telephone Encounter (Signed)
 Pt has been scheduled tele preop appt 12/01/24. Med rec and consent are done.

## 2024-12-01 ENCOUNTER — Ambulatory Visit

## 2024-12-11 ENCOUNTER — Ambulatory Visit (HOSPITAL_BASED_OUTPATIENT_CLINIC_OR_DEPARTMENT_OTHER): Admit: 2024-12-11

## 2024-12-11 ENCOUNTER — Encounter (HOSPITAL_BASED_OUTPATIENT_CLINIC_OR_DEPARTMENT_OTHER): Payer: Self-pay

## 2024-12-15 ENCOUNTER — Encounter (INDEPENDENT_AMBULATORY_CARE_PROVIDER_SITE_OTHER)

## 2024-12-26 ENCOUNTER — Ambulatory Visit

## 2025-03-06 ENCOUNTER — Ambulatory Visit: Admitting: Nurse Practitioner

## 2025-03-08 ENCOUNTER — Ambulatory Visit: Admitting: Cardiology
